# Patient Record
Sex: Female | Born: 1955
Health system: Southern US, Community
[De-identification: ages and names within clinical notes are randomized; demographics above are authoritative.]

## PROBLEM LIST (undated history)

## (undated) DIAGNOSIS — R200 Anesthesia of skin: Secondary | ICD-10-CM

## (undated) DIAGNOSIS — IMO0002 Reserved for concepts with insufficient information to code with codable children: Secondary | ICD-10-CM

## (undated) DIAGNOSIS — Z9889 Other specified postprocedural states: Secondary | ICD-10-CM

## (undated) DIAGNOSIS — M79609 Pain in unspecified limb: Secondary | ICD-10-CM

## (undated) DIAGNOSIS — J45909 Unspecified asthma, uncomplicated: Secondary | ICD-10-CM

## (undated) DIAGNOSIS — R112 Nausea with vomiting, unspecified: Secondary | ICD-10-CM

## (undated) DIAGNOSIS — R202 Paresthesia of skin: Secondary | ICD-10-CM

## (undated) DIAGNOSIS — Z9989 Dependence on other enabling machines and devices: Secondary | ICD-10-CM

## (undated) DIAGNOSIS — J189 Pneumonia, unspecified organism: Secondary | ICD-10-CM

## (undated) DIAGNOSIS — G4733 Obstructive sleep apnea (adult) (pediatric): Secondary | ICD-10-CM

## (undated) DIAGNOSIS — I10 Essential (primary) hypertension: Secondary | ICD-10-CM

## (undated) DIAGNOSIS — G43909 Migraine, unspecified, not intractable, without status migrainosus: Secondary | ICD-10-CM

## (undated) DIAGNOSIS — D649 Anemia, unspecified: Secondary | ICD-10-CM

## (undated) DIAGNOSIS — R531 Weakness: Principal | ICD-10-CM

## (undated) HISTORY — DX: Migraine, unspecified, not intractable, without status migrainosus: G43.909

## (undated) HISTORY — PX: KNEE ARTHROSCOPY: SHX127

## (undated) HISTORY — DX: Anesthesia of skin: R20.0

## (undated) HISTORY — DX: Pain in unspecified limb: M79.609

## (undated) HISTORY — DX: Weakness: R53.1

## (undated) HISTORY — PX: SHOULDER ARTHROSCOPY W/ SUPERIOR LABRAL ANTERIOR POSTERIOR REPAIR: SHX2403

## (undated) HISTORY — PX: RECTOCELE REPAIR: SHX761

## (undated) HISTORY — PX: BACK SURGERY: SHX140

## (undated) HISTORY — DX: Paresthesia of skin: R20.2

## (undated) HISTORY — PX: SHOULDER ARTHROSCOPY WITH SUBACROMIAL DECOMPRESSION: SHX5684

---

## 1979-08-12 HISTORY — PX: KNEE ARTHROSCOPY W/ MENISCAL REPAIR: SHX1877

## 1984-08-11 HISTORY — PX: VAGINAL HYSTERECTOMY: SUR661

## 2007-08-12 HISTORY — PX: KNEE ARTHROSCOPY W/ ACL RECONSTRUCTION: SHX1858

## 2010-08-11 HISTORY — PX: KNEE ARTHROSCOPY W/ MENISCAL REPAIR: SHX1877

## 2011-10-21 ENCOUNTER — Other Ambulatory Visit (HOSPITAL_COMMUNITY): Payer: Self-pay | Admitting: Chiropractic Medicine

## 2011-10-21 ENCOUNTER — Ambulatory Visit (HOSPITAL_COMMUNITY): Admission: RE | Admit: 2011-10-21 | Payer: 59 | Source: Other Acute Inpatient Hospital

## 2011-10-21 ENCOUNTER — Ambulatory Visit (HOSPITAL_COMMUNITY)
Admission: RE | Admit: 2011-10-21 | Discharge: 2011-10-21 | Disposition: A | Discharge status: Home / Self Care | Payer: 59 | Source: Ambulatory Visit | Attending: Chiropractic Medicine | Admitting: Chiropractic Medicine

## 2011-10-21 DIAGNOSIS — R200 Anesthesia of skin: Secondary | ICD-10-CM

## 2011-10-21 DIAGNOSIS — M5137 Other intervertebral disc degeneration, lumbosacral region: Secondary | ICD-10-CM | POA: Insufficient documentation

## 2011-10-21 DIAGNOSIS — R52 Pain, unspecified: Secondary | ICD-10-CM

## 2011-10-21 DIAGNOSIS — M51379 Other intervertebral disc degeneration, lumbosacral region without mention of lumbar back pain or lower extremity pain: Secondary | ICD-10-CM | POA: Insufficient documentation

## 2011-10-21 DIAGNOSIS — R209 Unspecified disturbances of skin sensation: Secondary | ICD-10-CM | POA: Insufficient documentation

## 2011-10-26 ENCOUNTER — Ambulatory Visit (HOSPITAL_COMMUNITY)
Admission: RE | Admit: 2011-10-26 | Discharge: 2011-10-26 | Disposition: A | Discharge status: Home / Self Care | Payer: 59 | Source: Other Acute Inpatient Hospital | Attending: Emergency Medicine | Admitting: Emergency Medicine

## 2011-10-27 LAB — CBC
MCH: 30.1 pg (ref 26.0–34.0)
MCHC: 32.9 g/dL (ref 30.0–36.0)
Platelets: 229 10*3/uL (ref 150–400)

## 2011-10-27 LAB — DIFFERENTIAL
Basophils Relative: 0 % (ref 0–1)
Eosinophils Absolute: 0.1 10*3/uL (ref 0.0–0.7)
Eosinophils Relative: 1 % (ref 0–5)
Neutrophils Relative %: 72 % (ref 43–77)

## 2011-10-27 LAB — RAPID HIV SCREEN (WH-MAU): Rapid HIV Screen: NEGATIVE — AB

## 2011-11-09 ENCOUNTER — Emergency Department (HOSPITAL_COMMUNITY)
Admission: EM | Admit: 2011-11-09 | Discharge: 2011-11-09 | Disposition: A | Discharge status: Home / Self Care | Payer: PRIVATE HEALTH INSURANCE | Attending: Emergency Medicine | Admitting: Emergency Medicine

## 2011-11-09 ENCOUNTER — Encounter (HOSPITAL_COMMUNITY): Payer: Self-pay | Admitting: *Deleted

## 2011-11-09 DIAGNOSIS — E860 Dehydration: Secondary | ICD-10-CM | POA: Insufficient documentation

## 2011-11-09 DIAGNOSIS — T887XXA Unspecified adverse effect of drug or medicament, initial encounter: Secondary | ICD-10-CM | POA: Insufficient documentation

## 2011-11-09 DIAGNOSIS — I10 Essential (primary) hypertension: Secondary | ICD-10-CM | POA: Insufficient documentation

## 2011-11-09 DIAGNOSIS — R1110 Vomiting, unspecified: Secondary | ICD-10-CM | POA: Insufficient documentation

## 2011-11-09 HISTORY — DX: Reserved for concepts with insufficient information to code with codable children: IMO0002

## 2011-11-09 LAB — COMPREHENSIVE METABOLIC PANEL
ALT: 18 U/L (ref 0–35)
Albumin: 4 g/dL (ref 3.5–5.2)
Calcium: 9.6 mg/dL (ref 8.4–10.5)
GFR calc Af Amer: >90 mL/min (ref >90)
Glucose, Bld: 85 mg/dL (ref 70–99)
Potassium: 3.4 mEq/L — ABNORMAL LOW (ref 3.5–5.1)
Sodium: 140 mEq/L (ref 135–145)
Total Protein: 7.3 g/dL (ref 6.0–8.3)

## 2011-11-09 MED ORDER — PROMETHAZINE HCL 25 MG/ML IJ SOLN
12.5000 mg | Freq: Once | INTRAMUSCULAR | Status: AC
  Administered 2011-11-09: 12:00:00 via INTRAVENOUS
  Filled 2011-11-09: qty 1

## 2011-11-09 MED ORDER — ONDANSETRON HCL 4 MG/2ML IJ SOLN
4.0000 mg | Freq: Once | INTRAMUSCULAR | Status: AC
  Administered 2011-11-09: 4 mg via INTRAVENOUS
  Filled 2011-11-09: qty 2

## 2011-11-09 MED ORDER — SODIUM CHLORIDE 0.9 % IV BOLUS (SEPSIS)
1000.0000 mL | Freq: Once | INTRAVENOUS | Status: AC
  Administered 2011-11-09: 1000 mL via INTRAVENOUS

## 2011-11-09 MED ORDER — METOCLOPRAMIDE HCL 5 MG/ML IJ SOLN
10.0000 mg | Freq: Once | INTRAMUSCULAR | Status: AC
  Administered 2011-11-09: 10 mg via INTRAVENOUS
  Filled 2011-11-09: qty 2

## 2011-11-09 NOTE — Discharge Instructions (Signed)
B.R.A.T. Diet Your doctor has recommended the B.R.A.T. diet for you or your child until the condition improves. This is often used to help control diarrhea and vomiting symptoms. If you or your child can tolerate clear liquids, you may have:  Bananas.   Rice.   Applesauce.   Toast (and other simple starches such as crackers, potatoes, noodles).  Be sure to avoid dairy products, meats, and fatty foods until symptoms are better. Fruit juices such as apple, grape, and prune juice can make diarrhea worse. Avoid these. Continue this diet for 2 days or as instructed by your caregiver. Document Released: 07/28/2005 Document Revised: 07/17/2011 Document Reviewed: 01/14/2007 Exit[employer] Patient Information 2012 [employer]. 

## 2011-11-09 NOTE — ED Provider Notes (Signed)
Medical screening examination/treatment/procedure(s) were conducted as a shared visit with non-physician practitioner(s) and myself.  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 She got halfway through the treatment course for 16 more days ago she been experiencing a lot of nausea vomiting and some mild abdominal discomfort for a while on that she started taking Zofran and Phenergan but today at work was feeling dehydrated rehydrated her she feels a better skin color is good could still clear her leave feeling somewhat not up to par will discharge home with work excuse.  Shelda Jakes, MD 11/09/11 1420

## 2011-11-09 NOTE — ED Notes (Signed)
Nausea and vomiting today.  Mild abd pain.

## 2011-11-28 NOTE — ED Provider Notes (Signed)
History     CSN: [id number]  Arrival date & time 11/09/11  1146   First MD Initiated Contact with Patient 11/09/11 1156      Chief Complaint  Patient presents with  . 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  She feels lightheaded when ambulating and very dehydrated.  Patient is a 56 y.o. female presenting with vomiting. The history is provided by the patient.  Emesis  The current episode started more than 1 week ago. The problem has been gradually worsening. There has been no fever. Pertinent negatives include no fever.    Past Medical History  Diagnosis Date  . Migraine   . DDD (degenerative disc disease)     Past Surgical History  Procedure Date  . Abdominal hysterectomy   . Rectocele repair   . Shoulder surgery     x 5  . Knee surgery     x 4    No family history on file.  History  Substance Use Topics  . Smoking status: Never Smoker   . Smokeless tobacco: Not on file  . Alcohol Use: No    OB History    Grav Para Term Preterm Abortions TAB SAB Ect Mult Living                  Review of Systems  Constitutional: Negative for fever.  HENT: Negative.  Negative for sore throat.   Respiratory: Negative.   Cardiovascular: Negative.   Gastrointestinal: Positive for nausea and vomiting.  Genitourinary: Negative.   Musculoskeletal: Negative.   Skin: Negative.   Neurological: Positive for weakness and light-headedness.  Hematological: Negative.     Allergies  Flexeril; Lyrica; Neurontin; Tape; Verapamil; and Versed  Home Medications   Current Outpatient Rx  Name Route Sig Dispense Refill  . AMITRIPTYLINE HCL 50 MG PO  TABS Oral Take 50 mg by mouth at bedtime as needed. migraines    . BUTALBITAL-APAP-CAFFEINE 50-300-40 MG PO CAPS Oral Take 1 capsule by mouth daily as needed. migraines    . DARUNAVIR ETHANOLATE 400 MG PO TABS Oral Take 400 mg by mouth at bedtime. 30 day regimen    . EMTRICITABINE-TENOFOVIR 200-300 MG PO TABS Oral Take 1 tablet by mouth daily.    Marland Kitchen ONDANSETRON HCL 4 MG PO TABS Oral Take 4 mg by mouth every 8 (eight) hours as needed. nausea    . PROMETHAZINE HCL 25 MG PO TABS Oral Take 25 mg by mouth every 6 (six) hours as needed. nausea    . RIBOFLAVIN-MAGNESIUM-FEVERFEW 200-180-50 MG PO TABS Oral Take 1 tablet by mouth at bedtime.    Marland Kitchen RITONAVIR 100 MG PO CAPS Oral Take 100 mg by mouth at bedtime.      BP 132/86  Pulse 96  Temp(Src) 98.1 F (36.7 C) (Oral)  Resp 18  Ht 5\' 11"  (1.803 m)  Wt 165 lb (74.844 kg)  BMI 23.01 kg/m2  SpO2 100%  Physical Exam  Nursing note and vitals reviewed. Constitutional: She appears well-developed and well-nourished.  HENT:  Head: Normocephalic and atraumatic.  Cardiovascular: Normal rate, regular rhythm and normal heart sounds.   Pulmonary/Chest: Effort normal and breath sounds normal.  Abdominal: Soft. She exhibits no distension. There is  no tenderness.  Musculoskeletal: Normal range of motion.  Neurological: She is alert.  Skin: Skin is warm and dry.    ED Course  Procedures (including critical care time)  Labs Reviewed  COMPREHENSIVE METABOLIC PANEL - Abnormal; Notable for the following:    Potassium 3.4 (*)    GFR calc non Af Amer 79 (*)    All other components within normal limits  LAB REPORT - SCANNED   No results found.   1. Emesis   2. Dehydration   3. Medication side effect     Patient given 3L NS per IV.  Phenergan 12.5 mg IV with no relief of nausea,  zofran with mild improvement.  Reglan given prior to dc home.  Zofran seemed to help nausea sx the best.  Improved lightheadedness at time of dc.  Family here to take her  home.  MDM  Side effects of anti HIV med prophylactic regimen.  CMET reviewed - LFT's normal at this time.  Pt to f/u with employee heath this week for recheck.        Candis Musa, PA 11/28/11 2322

## 2011-12-01 NOTE — ED Provider Notes (Signed)
Medical screening examination/treatment/procedure(s) were performed by non-physician practitioner and as supervising physician I was immediately available for consultation/collaboration.  Shelda Jakes, MD 12/01/11 1038

## 2012-02-02 ENCOUNTER — Ambulatory Visit (HOSPITAL_COMMUNITY)
Admission: RE | Admit: 2012-02-02 | Discharge: 2012-02-02 | Disposition: A | Discharge status: Home / Self Care | Payer: 59 | Source: Ambulatory Visit | Attending: Emergency Medicine | Admitting: Emergency Medicine

## 2012-02-02 ENCOUNTER — Encounter (HOSPITAL_COMMUNITY): Payer: Self-pay

## 2012-02-02 ENCOUNTER — Other Ambulatory Visit (HOSPITAL_COMMUNITY): Payer: Self-pay | Admitting: Emergency Medicine

## 2012-02-02 DIAGNOSIS — S0990XA Unspecified injury of head, initial encounter: Secondary | ICD-10-CM | POA: Insufficient documentation

## 2012-02-02 DIAGNOSIS — X58XXXA Exposure to other specified factors, initial encounter: Secondary | ICD-10-CM | POA: Insufficient documentation

## 2012-02-02 DIAGNOSIS — R51 Headache: Secondary | ICD-10-CM | POA: Insufficient documentation

## 2012-02-03 ENCOUNTER — Encounter (HOSPITAL_COMMUNITY): Payer: Self-pay | Admitting: *Deleted

## 2012-03-17 ENCOUNTER — Other Ambulatory Visit (HOSPITAL_COMMUNITY): Payer: Self-pay | Admitting: Orthopedic Surgery

## 2012-03-17 DIAGNOSIS — R52 Pain, unspecified: Secondary | ICD-10-CM

## 2012-03-19 ENCOUNTER — Ambulatory Visit (HOSPITAL_COMMUNITY)
Admission: RE | Admit: 2012-03-19 | Discharge: 2012-03-19 | Disposition: A | Discharge status: Home / Self Care | Payer: 59 | Source: Ambulatory Visit | Attending: Orthopedic Surgery | Admitting: Orthopedic Surgery

## 2012-03-19 DIAGNOSIS — R52 Pain, unspecified: Secondary | ICD-10-CM

## 2012-03-19 DIAGNOSIS — M25569 Pain in unspecified knee: Secondary | ICD-10-CM | POA: Insufficient documentation

## 2012-03-19 DIAGNOSIS — Z9889 Other specified postprocedural states: Secondary | ICD-10-CM | POA: Insufficient documentation

## 2012-03-19 DIAGNOSIS — M7120 Synovial cyst of popliteal space [Baker], unspecified knee: Secondary | ICD-10-CM | POA: Insufficient documentation

## 2012-04-06 ENCOUNTER — Emergency Department (HOSPITAL_COMMUNITY)
Admission: EM | Admit: 2012-04-06 | Discharge: 2012-04-06 | Disposition: A | Discharge status: Home / Self Care | Payer: 59 | Attending: Emergency Medicine | Admitting: Emergency Medicine

## 2012-04-06 ENCOUNTER — Encounter (HOSPITAL_COMMUNITY): Payer: Self-pay | Admitting: *Deleted

## 2012-04-06 ENCOUNTER — Ambulatory Visit (HOSPITAL_COMMUNITY)
Admit: 2012-04-06 | Discharge: 2012-04-06 | Disposition: A | Discharge status: Home / Self Care | Payer: 59 | Source: Ambulatory Visit | Attending: Emergency Medicine | Admitting: Emergency Medicine

## 2012-04-06 DIAGNOSIS — M79609 Pain in unspecified limb: Secondary | ICD-10-CM | POA: Insufficient documentation

## 2012-04-06 DIAGNOSIS — IMO0002 Reserved for concepts with insufficient information to code with codable children: Secondary | ICD-10-CM | POA: Insufficient documentation

## 2012-04-06 DIAGNOSIS — M7120 Synovial cyst of popliteal space [Baker], unspecified knee: Secondary | ICD-10-CM | POA: Insufficient documentation

## 2012-04-06 DIAGNOSIS — Z888 Allergy status to other drugs, medicaments and biological substances status: Secondary | ICD-10-CM | POA: Insufficient documentation

## 2012-04-06 DIAGNOSIS — M79605 Pain in left leg: Secondary | ICD-10-CM

## 2012-04-06 MED ORDER — ENOXAPARIN SODIUM 80 MG/0.8ML ~~LOC~~ SOLN
1.0000 mg/kg | Freq: Once | SUBCUTANEOUS | Status: AC
  Administered 2012-04-06: 75 mg via SUBCUTANEOUS
  Filled 2012-04-06: qty 0.8

## 2012-04-06 MED ORDER — HYDROCODONE-ACETAMINOPHEN 5-325 MG PO TABS: 1.0000 | ORAL_TABLET | ORAL | Status: DC | PRN

## 2012-04-06 MED ORDER — OXYCODONE-ACETAMINOPHEN 5-325 MG PO TABS
2.0000 | ORAL_TABLET | Freq: Once | ORAL | Status: AC
  Administered 2012-04-06: 2 via ORAL
  Filled 2012-04-06: qty 2

## 2012-04-06 NOTE — ED Notes (Signed)
Palpable pedal pulse left foot, cool to touch.  Pt states she is having pain in her left medial calf, cramping sensation.  States she has a bakers cyst that is leaking into the muscle of her left calf.

## 2012-04-06 NOTE — ED Provider Notes (Signed)
History     CSN: [id number]  Arrival date & time 04/06/12  0025   First MD Initiated Contact with Patient 04/06/12 0037      Chief Complaint  Patient presents with  . Leg Pain     The history is provided by the patient.   patient awoke this evening with severe left calf pain.  She reports the pain was severe.  She did not feel a knot in the back of her leg.  She is currently being followed by orthopedic surgeon for a leaking Baker cyst in her left popliteal space.  She denies weakness or numbness of her left lower extremity.  She's had no spreading erythema.  She denies swelling of her left leg.  She's had no chest pain or shortness of breath.  She has no other complaints.  Her pain is improving since awakening this morning  Past Medical History  Diagnosis Date  . Migraine   . DDD (degenerative disc disease)     Past Surgical History  Procedure Date  . Abdominal hysterectomy   . Rectocele repair   . Shoulder surgery     x 5  . Knee surgery     x 4    History reviewed. No pertinent family history.  History  Substance Use Topics  . Smoking status: Never Smoker   . Smokeless tobacco: Not on file  . Alcohol Use: No    OB History    Grav Para Term Preterm Abortions TAB SAB Ect Mult Living                  Review of Systems  All other systems reviewed and are negative.    Allergies  Flexeril; Midazolam hcl; Neurontin; Pregabalin; Tape; and Verapamil  Home Medications   Current Outpatient Rx  Name Route Sig Dispense Refill  . AMITRIPTYLINE HCL 50 MG PO TABS Oral Take 50 mg by mouth at bedtime as needed. migraines    . BUTALBITAL-APAP-CAFFEINE 50-300-40 MG PO CAPS Oral Take 1 capsule by mouth daily as needed. migraines    . DARUNAVIR ETHANOLATE 400 MG PO TABS Oral Take 400 mg by mouth at bedtime. 30 day regimen    . EMTRICITABINE-TENOFOVIR 200-300 MG PO TABS Oral Take 1 tablet by mouth daily.    Marland Kitchen HYDROCODONE-ACETAMINOPHEN 5-325 MG PO TABS Oral Take 1 tablet by  mouth every 4 (four) hours as needed for pain. 12 tablet 0  . ONDANSETRON HCL 4 MG PO TABS Oral Take 4 mg by mouth every 8 (eight) hours as needed. nausea    . PROMETHAZINE HCL 25 MG PO TABS Oral Take 25 mg by mouth every 6 (six) hours as needed. nausea    . RIBOFLAVIN-MAGNESIUM-FEVERFEW 200-180-50 MG PO TABS Oral Take 1 tablet by mouth at bedtime.    Marland Kitchen RITONAVIR 100 MG PO CAPS Oral Take 100 mg by mouth at bedtime.      BP 138/79  Pulse 94  Temp 97.5 F (36.4 C) (Oral)  Resp 18  Ht 5\' 11"  (1.803 m)  Wt 168 lb (76.204 kg)  BMI 23.43 kg/m2  SpO2 100%  Physical Exam  Nursing note and vitals reviewed. Constitutional: She is oriented to person, place, and time. She appears well-developed and well-nourished. No distress.  HENT:  Head: Normocephalic and atraumatic.  Eyes: EOM are normal.  Neck: Normal range of motion.  Pulmonary/Chest: Effort normal.  Musculoskeletal: Normal range of motion.       Normal pulses in her left foot.  Mild tenderness of left medial calf.  No erythema or swelling noted.  No unilateral leg swelling on the left.  Legs are symmetric bilaterally.  Neurological: She is alert and oriented to person, place, and time.  Skin: Skin is warm and dry.  Psychiatric: She has a normal mood and affect. Judgment normal.    ED Course  Procedures (including critical care time)  Labs Reviewed - No data to display No results found.   1. Left leg pain       MDM  Unclear etiology of left leg pain.  Lovenox now and DVT scan in the morning. Close PCP and orthopedic followup.  No signs of infection.  Normal pulses.  Compartments are soft.        Lyanne Co, MD 04/06/12 [phone]

## 2012-04-06 NOTE — ED Notes (Signed)
Pt reporting pain in lower left leg.  Denies injury, negative homans sign.

## 2012-04-09 ENCOUNTER — Encounter (HOSPITAL_COMMUNITY): Payer: Self-pay | Admitting: Pharmacy Technician

## 2012-04-19 ENCOUNTER — Encounter (HOSPITAL_COMMUNITY)
Admission: RE | Admit: 2012-04-19 | Discharge: 2012-04-19 | Disposition: A | Discharge status: Home / Self Care | Payer: 59 | Source: Ambulatory Visit | Attending: Orthopedic Surgery | Admitting: Orthopedic Surgery

## 2012-04-19 ENCOUNTER — Encounter (HOSPITAL_COMMUNITY): Payer: Self-pay

## 2012-04-19 HISTORY — DX: Anemia, unspecified: D64.9

## 2012-04-19 HISTORY — DX: Unspecified asthma, uncomplicated: J45.909

## 2012-04-19 HISTORY — DX: Pneumonia, unspecified organism: J18.9

## 2012-04-19 HISTORY — DX: Reserved for concepts with insufficient information to code with codable children: IMO0002

## 2012-04-19 HISTORY — DX: Nausea with vomiting, unspecified: R11.2

## 2012-04-19 HISTORY — DX: Other specified postprocedural states: Z98.890

## 2012-04-19 LAB — CBC
HCT: 41.7 % (ref 36.0–46.0)
Hemoglobin: 13.5 g/dL (ref 12.0–15.0)
MCV: 93.9 fL (ref 78.0–100.0)
RBC: 4.44 MIL/uL (ref 3.87–5.11)
RDW: 14.3 % (ref 11.5–15.5)
WBC: 5.4 10*3/uL (ref 4.0–10.5)

## 2012-04-19 LAB — SURGICAL PCR SCREEN: Staphylococcus aureus: NEGATIVE

## 2012-04-19 MED ORDER — CHLORHEXIDINE GLUCONATE 4 % EX LIQD: 60.0000 mL | Freq: Once | CUTANEOUS | Status: DC

## 2012-04-19 NOTE — Pre-Procedure Instructions (Signed)
22 Melissa Blackburn  04/19/2012   Your procedure is scheduled on:  Friday April 23, 2012  Report to [facility] at 9:30 AM.  Call this number if you have problems the morning of surgery: [phone]   Remember:   Do not eat food or drinkAfter Midnight.      Take these medicines the morning of surgery with A SIP OF WATER: Norco, Robaxin   Do not wear jewelry, make-up or nail polish.  Do not wear lotions, powders, or perfumes.   Do not shave 48 hours prior to surgery. Men may shave face and neck.  Do not bring valuables to the hospital.  Contacts, dentures or bridgework may not be worn into surgery.  Leave suitcase in the car. After surgery it may be brought to your room.  For patients admitted to the hospital, checkout time is 11:00 AM the day of discharge.   Patients discharged the day of surgery will not be allowed to drive home.  Name and phone number of your driver: Bess Saltzman, husband  Special Instructions: Incentive Spirometry - Practice and bring it with you on the day of surgery. and CHG Shower Use Special Wash: 1/2 bottle night before surgery and 1/2 bottle morning of surgery.   Please read over the following fact sheets that you were given: Pain Booklet, Coughing and Deep Breathing, MRSA Information and Surgical Site Infection Prevention

## 2012-04-19 NOTE — Progress Notes (Signed)
Patient undergoing testing due to needle stick, patient request surgeon be informed

## 2012-04-21 NOTE — H&P (Signed)
CC: left knee pain HPI: 56 y/o a left knee injury 3 months ago c/o continued moderate left knee medial knee pain and evidence of a ruptured baker's cyst on mri. Pt has elected for knee arthroscopy to aid with pain relief PMH: migraines, sleep apnea Allergies: flexeril, midazolam, neurontin, verapamil Meds: norco, robaxin, amitriptyline Social: married, non smoker, no etoh ROS: moderate pain to right medial knee otherwise negative ros PE: alert and appropriate 56 y/o female in no acute distress alert and oriented x 3 Bilateral upper extremities shows full rom no tenderness Left knee: trace effusion, moderate medial joint line tenderness nv intact distally Normal heel toe gait Right lower extremity: full rom no tenderness or deformity MRI: evidence of ruptured baker's cyst with normal appearing menisci Assessment: continued left knee pain s/p injury and injection Plan: arthroscopy of left knee to decrease pain and increase function

## 2012-04-22 MED ORDER — CEFAZOLIN SODIUM-DEXTROSE 2-3 GM-% IV SOLR
2.0000 g | INTRAVENOUS | Status: AC
  Administered 2012-04-23: 2 g via INTRAVENOUS
  Filled 2012-04-22: qty 50

## 2012-04-23 ENCOUNTER — Encounter (HOSPITAL_COMMUNITY): Payer: Self-pay | Admitting: Certified Registered"

## 2012-04-23 ENCOUNTER — Ambulatory Visit (HOSPITAL_COMMUNITY): Payer: 59 | Admitting: Certified Registered"

## 2012-04-23 ENCOUNTER — Ambulatory Visit (HOSPITAL_COMMUNITY)
Admission: RE | Admit: 2012-04-23 | Discharge: 2012-04-23 | Disposition: A | Discharge status: Home / Self Care | Payer: 59 | Source: Ambulatory Visit | Attending: Orthopedic Surgery | Admitting: Orthopedic Surgery

## 2012-04-23 ENCOUNTER — Encounter (HOSPITAL_COMMUNITY): Payer: Self-pay | Admitting: *Deleted

## 2012-04-23 ENCOUNTER — Encounter (HOSPITAL_COMMUNITY)
Admission: RE | Disposition: A | Discharge status: Home / Self Care | Payer: Self-pay | Source: Ambulatory Visit | Attending: Orthopedic Surgery

## 2012-04-23 DIAGNOSIS — X500XXA Overexertion from strenuous movement or load, initial encounter: Secondary | ICD-10-CM | POA: Insufficient documentation

## 2012-04-23 DIAGNOSIS — Z01812 Encounter for preprocedural laboratory examination: Secondary | ICD-10-CM | POA: Insufficient documentation

## 2012-04-23 DIAGNOSIS — G4730 Sleep apnea, unspecified: Secondary | ICD-10-CM | POA: Insufficient documentation

## 2012-04-23 DIAGNOSIS — M2340 Loose body in knee, unspecified knee: Secondary | ICD-10-CM | POA: Insufficient documentation

## 2012-04-23 DIAGNOSIS — S83289A Other tear of lateral meniscus, current injury, unspecified knee, initial encounter: Secondary | ICD-10-CM | POA: Insufficient documentation

## 2012-04-23 HISTORY — PX: KNEE ARTHROSCOPY: SHX127

## 2012-04-23 SURGERY — ARTHROSCOPY, KNEE
Anesthesia: General | Site: Knee | Laterality: Left | Wound class: Clean

## 2012-04-23 MED ORDER — BUPIVACAINE-EPINEPHRINE (PF) 0.5% -1:200000 IJ SOLN
INTRAMUSCULAR | Status: AC
  Filled 2012-04-23: qty 10

## 2012-04-23 MED ORDER — LIDOCAINE HCL (CARDIAC) 20 MG/ML IV SOLN
INTRAVENOUS | Status: DC | PRN
  Administered 2012-04-23: 100 mg via INTRAVENOUS

## 2012-04-23 MED ORDER — FENTANYL CITRATE 0.05 MG/ML IJ SOLN
INTRAMUSCULAR | Status: DC | PRN
  Administered 2012-04-23: 150 ug via INTRAVENOUS
  Administered 2012-04-23: 100 ug via INTRAVENOUS
  Administered 2012-04-23: 50 ug via INTRAVENOUS

## 2012-04-23 MED ORDER — KETOROLAC TROMETHAMINE 30 MG/ML IJ SOLN
30.0000 mg | INTRAMUSCULAR | Status: DC
  Filled 2012-04-23: qty 1

## 2012-04-23 MED ORDER — METHOCARBAMOL 500 MG PO TABS: 500.0000 mg | ORAL_TABLET | Freq: Three times a day (TID) | ORAL | Status: AC | PRN

## 2012-04-23 MED ORDER — LACTATED RINGERS IV SOLN
INTRAVENOUS | Status: DC | PRN
  Administered 2012-04-23 (×2): via INTRAVENOUS

## 2012-04-23 MED ORDER — PROMETHAZINE HCL 25 MG/ML IJ SOLN
INTRAMUSCULAR | Status: AC
  Filled 2012-04-23: qty 1

## 2012-04-23 MED ORDER — PROPOFOL 10 MG/ML IV BOLUS
INTRAVENOUS | Status: DC | PRN
  Administered 2012-04-23: 200 mg via INTRAVENOUS

## 2012-04-23 MED ORDER — ONDANSETRON HCL 4 MG/2ML IJ SOLN
INTRAMUSCULAR | Status: DC | PRN
  Administered 2012-04-23: 4 mg via INTRAVENOUS

## 2012-04-23 MED ORDER — MORPHINE SULFATE 10 MG/ML IJ SOLN
INTRAMUSCULAR | Status: DC | PRN
  Administered 2012-04-23 (×2): 5 mg via INTRAVENOUS

## 2012-04-23 MED ORDER — OXYCODONE-ACETAMINOPHEN 5-325 MG PO TABS: 1.0000 | ORAL_TABLET | ORAL | Status: AC | PRN

## 2012-04-23 MED ORDER — HYDROMORPHONE HCL PF 1 MG/ML IJ SOLN
0.2500 mg | INTRAMUSCULAR | Status: DC | PRN
  Administered 2012-04-23 (×2): 0.5 mg via INTRAVENOUS

## 2012-04-23 MED ORDER — HYDROMORPHONE HCL PF 1 MG/ML IJ SOLN
INTRAMUSCULAR | Status: AC
  Filled 2012-04-23: qty 1

## 2012-04-23 MED ORDER — PROMETHAZINE HCL 25 MG/ML IJ SOLN
6.2500 mg | INTRAMUSCULAR | Status: DC | PRN
  Administered 2012-04-23: 12.5 mg via INTRAVENOUS

## 2012-04-23 MED ORDER — BUPIVACAINE-EPINEPHRINE 0.5% -1:200000 IJ SOLN
INTRAMUSCULAR | Status: DC | PRN
  Administered 2012-04-23: 20 mL

## 2012-04-23 MED ORDER — KETOROLAC TROMETHAMINE 30 MG/ML IJ SOLN
INTRAMUSCULAR | Status: DC | PRN
  Administered 2012-04-23: 30 mg via INTRAVENOUS

## 2012-04-23 MED ORDER — DEXAMETHASONE SODIUM PHOSPHATE 4 MG/ML IJ SOLN
INTRAMUSCULAR | Status: DC | PRN
  Administered 2012-04-23: 8 mg via INTRAVENOUS

## 2012-04-23 SURGICAL SUPPLY — 44 items
BANDAGE ELASTIC 4 VELCRO ST LF (GAUZE/BANDAGES/DRESSINGS) IMPLANT
BANDAGE ELASTIC 6 VELCRO ST LF (GAUZE/BANDAGES/DRESSINGS) ×2 IMPLANT
BANDAGE GAUZE ELAST BULKY 4 IN (GAUZE/BANDAGES/DRESSINGS) ×4 IMPLANT
BLADE CUTTER GATOR 3.5 (BLADE) ×2 IMPLANT
CLOTH BEACON ORANGE TIMEOUT ST (SAFETY) ×2 IMPLANT
COVER SURGICAL LIGHT HANDLE (MISCELLANEOUS) IMPLANT
DRAPE ARTHROSCOPY W/POUCH 114 (DRAPES) ×2 IMPLANT
DRSG ADAPTIC 3X8 NADH LF (GAUZE/BANDAGES/DRESSINGS) ×2 IMPLANT
DRSG EMULSION OIL 3X3 NADH (GAUZE/BANDAGES/DRESSINGS) ×2 IMPLANT
DURAPREP 26ML APPLICATOR (WOUND CARE) ×2 IMPLANT
ELECT MENISCUS 165MM 90D (ELECTRODE) IMPLANT
ELECT REM PT RETURN 9FT ADLT (ELECTROSURGICAL)
ELECTRODE REM PT RTRN 9FT ADLT (ELECTROSURGICAL) IMPLANT
GAUZE SPONGE 4X4 16PLY XRAY LF (GAUZE/BANDAGES/DRESSINGS) ×2 IMPLANT
GLOVE BIO SURGEON STRL SZ7 (GLOVE) ×2 IMPLANT
GLOVE BIOGEL PI IND STRL 6.5 (GLOVE) ×2 IMPLANT
GLOVE BIOGEL PI INDICATOR 6.5 (GLOVE) ×2
GLOVE BIOGEL PI ORTHO PRO 7.5 (GLOVE) ×1
GLOVE BIOGEL PI ORTHO PRO SZ8 (GLOVE) ×1
GLOVE ORTHO TXT STRL SZ7.5 (GLOVE) ×2 IMPLANT
GLOVE PI ORTHO PRO STRL 7.5 (GLOVE) ×1 IMPLANT
GLOVE PI ORTHO PRO STRL SZ8 (GLOVE) ×1 IMPLANT
GLOVE SURG ORTHO 8.5 STRL (GLOVE) ×2 IMPLANT
GOWN STRL NON-REIN LRG LVL3 (GOWN DISPOSABLE) ×4 IMPLANT
GOWN STRL REIN XL XLG (GOWN DISPOSABLE) ×4 IMPLANT
KIT BASIN OR (CUSTOM PROCEDURE TRAY) ×2 IMPLANT
KIT ROOM TURNOVER OR (KITS) ×2 IMPLANT
MANIFOLD NEPTUNE II (INSTRUMENTS) ×2 IMPLANT
PACK ARTHROSCOPY DSU (CUSTOM PROCEDURE TRAY) ×2 IMPLANT
PAD ARMBOARD 7.5X6 YLW CONV (MISCELLANEOUS) ×4 IMPLANT
PAD CAST 4YDX4 CTTN HI CHSV (CAST SUPPLIES) ×1 IMPLANT
PADDING CAST COTTON 4X4 STRL (CAST SUPPLIES) ×1
PADDING CAST COTTON 6X4 STRL (CAST SUPPLIES) ×2 IMPLANT
PENCIL BUTTON HOLSTER BLD 10FT (ELECTRODE) IMPLANT
SET ARTHROSCOPY TUBING (MISCELLANEOUS) ×1
SET ARTHROSCOPY TUBING LN (MISCELLANEOUS) ×1 IMPLANT
SPONGE GAUZE 4X4 12PLY (GAUZE/BANDAGES/DRESSINGS) ×2 IMPLANT
STRIP CLOSURE SKIN 1/2X4 (GAUZE/BANDAGES/DRESSINGS) ×2 IMPLANT
SUT MNCRL AB 4-0 PS2 18 (SUTURE) ×2 IMPLANT
TOWEL OR 17X24 6PK STRL BLUE (TOWEL DISPOSABLE) ×4 IMPLANT
TUBE CONNECTING 12X1/4 (SUCTIONS) IMPLANT
WAND 90 DEG TURBOVAC W/CORD (SURGICAL WAND) ×2 IMPLANT
WATER STERILE IRR 1000ML POUR (IV SOLUTION) ×2 IMPLANT
WRAP KNEE MAXI GEL POST OP (GAUZE/BANDAGES/DRESSINGS) ×2 IMPLANT

## 2012-04-23 NOTE — Anesthesia Preprocedure Evaluation (Addendum)
Anesthesia Evaluation  Patient identified by MRN, date of birth, ID band Patient awake    Reviewed: Allergy & Precautions, H&P , NPO status , Patient's Chart, lab work & pertinent test results  History of Anesthesia Complications (+) PONV  Airway Mallampati: I TM Distance: >3 FB Neck ROM: Full    Dental  (+) Teeth Intact   Pulmonary asthma , sleep apnea and Continuous Positive Airway Pressure Ventilation ,  breath sounds clear to auscultation        Cardiovascular Rhythm:Regular     Neuro/Psych  Headaches,    GI/Hepatic   Endo/Other    Renal/GU      Musculoskeletal   Abdominal   Peds  Hematology   Anesthesia Other Findings   Reproductive/Obstetrics                          Anesthesia Physical Anesthesia Plan  ASA: II  Anesthesia Plan: General   Post-op Pain Management:    Induction: Intravenous  Airway Management Planned: LMA  Additional Equipment:   Intra-op Plan:   Post-operative Plan: Extubation in OR  Informed Consent: I have reviewed the patients History and Physical, chart, labs and discussed the procedure including the risks, benefits and alternatives for the proposed anesthesia with the patient or authorized representative who has indicated his/her understanding and acceptance.   Dental advisory given  Plan Discussed with: Surgeon and CRNA  Anesthesia Plan Comments:        Anesthesia Quick Evaluation

## 2012-04-23 NOTE — Anesthesia Postprocedure Evaluation (Signed)
  Anesthesia Post-op Note  Patient: Melissa Blackburn  Procedure(s) Performed: Procedure(s) (LRB) with comments: ARTHROSCOPY KNEE (Left) - lateral meniscectomy  Patient Location: PACU  Anesthesia Type: General  Level of Consciousness: awake, oriented and patient cooperative  Airway and Oxygen Therapy: Patient Spontanous Breathing  Post-op Pain: mild  Post-op Assessment: Post-op Vital signs reviewed and Patient's Cardiovascular Status Stable  Post-op Vital Signs: stable  Complications: No apparent anesthesia complications

## 2012-04-23 NOTE — Transfer of Care (Signed)
Immediate Anesthesia Transfer of Care Note  Patient: Melissa Blackburn  Procedure(s) Performed: Procedure(s) (LRB) with comments: ARTHROSCOPY KNEE (Left) - lateral meniscectomy  Patient Location: PACU  Anesthesia Type: General  Level of Consciousness: awake and alert   Airway & Oxygen Therapy: Patient Spontanous Breathing and Patient connected to face mask oxygen  Post-op Assessment: Report given to PACU RN and Post -op Vital signs reviewed and stable  Post vital signs: Reviewed and stable  Complications: No apparent anesthesia complications

## 2012-04-23 NOTE — Anesthesia Procedure Notes (Signed)
Procedure Name: LMA Insertion Date/Time: 04/23/2012 12:04 PM Performed by: Arlice Colt B Pre-anesthesia Checklist: Patient identified, Emergency Drugs available, Suction available, Patient being monitored and Timeout performed Patient Re-evaluated:Patient Re-evaluated prior to inductionOxygen Delivery Method: Circle system utilized Preoxygenation: Pre-oxygenation with 100% oxygen Intubation Type: IV induction LMA: LMA inserted LMA Size: 4.0 Number of attempts: 1 Placement Confirmation: positive ETCO2 and breath sounds checked- equal and bilateral Tube secured with: Tape Dental Injury: Teeth and Oropharynx as per pre-operative assessment

## 2012-04-23 NOTE — Brief Op Note (Signed)
04/23/2012  1:06 PM  PATIENT:  Melissa Blackburn  56 y.o. female  PRE-OPERATIVE DIAGNOSIS:  left knee meniscal tear  POST-OPERATIVE DIAGNOSIS:  left knee meniscal tear, lateral , ACL intact  PROCEDURE:  Procedure(s) (LRB) with comments: ARTHROSCOPY KNEE (Left) - lateral meniscectomy  SURGEON:  Surgeon(s) and Role:    * Verlee Rossetti, MD - Primary  PHYSICIAN ASSISTANT:   ASSISTANTS: none   ANESTHESIA:   general  EBL:  Total I/O In: 1000 [I.V.:1000] Out: -   BLOOD ADMINISTERED:none  DRAINS: none   LOCAL MEDICATIONS USED:  MARCAINE     SPECIMEN:  No Specimen  DISPOSITION OF SPECIMEN:  N/A  COUNTS:  YES  TOURNIQUET:  * No tourniquets in log *  DICTATION: .Other Dictation: Dictation Number [id number]  PLAN OF CARE: Discharge to home  PATIENT DISPOSITION:  PACU - hemodynamically stable.   Delay start of Pharmacological VTE agent (>24hrs) due to surgical blood loss or risk of bleeding: not applicable

## 2012-04-23 NOTE — Discharge Instructions (Signed)
WBAT with crutches for balance Ice the knee at all times, Do exercises every hour, quad sets, heel slides, ans calf pumps  Keep incisions clean and dry for 5 days then ok to get them wet in the shower  Follow up in two weeks  Take one baby aspirin a day to prevent blood clots and wear compression type socks

## 2012-04-23 NOTE — Interval H&P Note (Signed)
History and Physical Interval Note:  04/23/2012 11:39 AM  Melissa Blackburn  has presented today for surgery, with the diagnosis of left knee meniscal tear  The various methods of treatment have been discussed with the patient and family. After consideration of risks, benefits and other options for treatment, the patient has consented to  Procedure(s) (LRB) with comments: ARTHROSCOPY KNEE (Left) as a surgical intervention .  The patient's history has been reviewed, patient examined, no change in status, stable for surgery.  I have reviewed the patient's chart and labs.  Questions were answered to the patient's satisfaction.     Macari Zalesky,STEVEN R

## 2012-04-24 NOTE — Op Note (Signed)
NAME:  Melissa Blackburn, Melissa Blackburn NO.:  [id number]  MEDICAL RECORD NO.:  [id number]  LOCATION:  [facility]                         FACILITY:  [facility]  PHYSICIAN:  Almedia Balls. Ranell Patrick, M.D. DATE OF BIRTH:  October 07, 1955  DATE OF PROCEDURE:  04/23/2012 DATE OF DISCHARGE:  04/23/2012                              OPERATIVE REPORT   PREOPERATIVE DIAGNOSIS:  Left knee meniscal tear.  POSTOPERATIVE DIAGNOSES: 1. Left knee lateral meniscus tear at the meniscal root 2. Anterior cruciate ligament intact.  PROCEDURE PERFORMED:  Left knee arthroscopy with partial lateral meniscectomy and limited chondroplasty throughout the knee.  ATTENDING SURGEON:  Almedia Balls. Ranell Patrick, MD.  ANESTHESIA:  LMA general anesthesia was used.  INSTRUMENT COUNTS:  Correct.  COMPLICATIONS:  No complications.  ANTIBIOTICS:  Perioperative antibiotics were given.  ESTIMATED BLOOD LOSS:  Minimal.  FLUID REPLACEMENT:  1000 mL crystalloid.  INDICATIONS:  The patient is a 56 year old female with worsening left knee pain after a twisting injury to her knee.  The patient had a prior ACL reconstruction.  Preoperative MRI scan suspicious for medial meniscus tear and given ongoing pain in her knee, the patient elected to proceed with arthroscopic knee surgery to eliminate pain and restore function to her knee.  Informed consent was obtained.  DESCRIPTION OF PROCEDURE:  After adequate level of anesthesia achieved, the patient was positioned in the supine position.  Left leg correctly identified and placed in arthroscopic leg holder.  Right leg placed in a well leg holder.  Sterile prep and drape of left knee.  Time-out was called.  We entered the knee in standard portals including superolateral outflow, anterolateral scope, and anteromedial working portals.  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  We performed a tangential chondroplasty up on the  patella.  Medial and lateral gutters were inspected.  No loose bodies noted. Medial meniscus was probed and visualized on all surfaces from the meniscal root posteriorly to anteriorly.  No tear was identified.  There was some subtle chondromalacia on the femoral condyle and some soft cartilage there.  We just did tangential chondroplasty, just trying to remove loose flaps and fibrillation.  No deep layer nor deep debridement was done.  ACL was intact.  There were some frayed fibers which we removed using suction shaver and used the ArthroCare to seal over some of the collagen just superficially to try to prevent any more fraying.  Otherwise a small bony loose body that was sort of well encapsulated just anterior to the ACL.  I wanted to make sure that was not impinging, so we removed that with a pituitary rongeur and shaver.  Lateral compartment was entered. There was a posterior horn lateral meniscus tear at the meniscal root. We performed a partial meniscectomy removing very little of that just maybe 20-30% of the meniscal root and beveled into the meniscus tear. The meniscus was quite stable.  I probed it throughout and no other tear was identified in the lateral side.  The lateral articular cartilage looked a lot better than the medial side.  Following complete inspection of the knee including putting the scope through the notch  and the posterior aspect of the knee, we lavaged the knee and concluded surgery suturing the wounds with 4-0 Monocryl followed by Steri-Strips and sterile compressive bandage.  The patient tolerated the surgery well.     Almedia Balls. Ranell Patrick, M.D.     SRN/MEDQ  D:  04/23/2012  T:  04/24/2012  Job:  [id number]

## 2012-04-26 ENCOUNTER — Encounter (HOSPITAL_COMMUNITY): Payer: Self-pay | Admitting: Orthopedic Surgery

## 2012-10-02 ENCOUNTER — Emergency Department (HOSPITAL_COMMUNITY): Payer: 59

## 2012-10-02 ENCOUNTER — Encounter (HOSPITAL_COMMUNITY): Payer: Self-pay | Admitting: Emergency Medicine

## 2012-10-02 ENCOUNTER — Emergency Department (HOSPITAL_COMMUNITY)
Admission: EM | Admit: 2012-10-02 | Discharge: 2012-10-02 | Disposition: A | Discharge status: Home / Self Care | Payer: 59 | Attending: Emergency Medicine | Admitting: Emergency Medicine

## 2012-10-02 ENCOUNTER — Other Ambulatory Visit: Payer: Self-pay

## 2012-10-02 DIAGNOSIS — Z79899 Other long term (current) drug therapy: Secondary | ICD-10-CM | POA: Insufficient documentation

## 2012-10-02 DIAGNOSIS — G4730 Sleep apnea, unspecified: Secondary | ICD-10-CM | POA: Insufficient documentation

## 2012-10-02 DIAGNOSIS — Z9981 Dependence on supplemental oxygen: Secondary | ICD-10-CM | POA: Insufficient documentation

## 2012-10-02 DIAGNOSIS — G43909 Migraine, unspecified, not intractable, without status migrainosus: Secondary | ICD-10-CM | POA: Insufficient documentation

## 2012-10-02 DIAGNOSIS — E876 Hypokalemia: Secondary | ICD-10-CM

## 2012-10-02 DIAGNOSIS — Z862 Personal history of diseases of the blood and blood-forming organs and certain disorders involving the immune mechanism: Secondary | ICD-10-CM | POA: Insufficient documentation

## 2012-10-02 DIAGNOSIS — R079 Chest pain, unspecified: Secondary | ICD-10-CM

## 2012-10-02 DIAGNOSIS — M62838 Other muscle spasm: Secondary | ICD-10-CM | POA: Insufficient documentation

## 2012-10-02 DIAGNOSIS — Z8739 Personal history of other diseases of the musculoskeletal system and connective tissue: Secondary | ICD-10-CM | POA: Insufficient documentation

## 2012-10-02 DIAGNOSIS — R0602 Shortness of breath: Secondary | ICD-10-CM | POA: Insufficient documentation

## 2012-10-02 DIAGNOSIS — R748 Abnormal levels of other serum enzymes: Secondary | ICD-10-CM

## 2012-10-02 DIAGNOSIS — J45909 Unspecified asthma, uncomplicated: Secondary | ICD-10-CM | POA: Insufficient documentation

## 2012-10-02 DIAGNOSIS — R252 Cramp and spasm: Secondary | ICD-10-CM

## 2012-10-02 DIAGNOSIS — R61 Generalized hyperhidrosis: Secondary | ICD-10-CM | POA: Insufficient documentation

## 2012-10-02 DIAGNOSIS — M2550 Pain in unspecified joint: Secondary | ICD-10-CM | POA: Insufficient documentation

## 2012-10-02 DIAGNOSIS — R110 Nausea: Secondary | ICD-10-CM | POA: Insufficient documentation

## 2012-10-02 DIAGNOSIS — Z8701 Personal history of pneumonia (recurrent): Secondary | ICD-10-CM | POA: Insufficient documentation

## 2012-10-02 LAB — CBC WITH DIFFERENTIAL/PLATELET
Basophils Absolute: 0 10*3/uL (ref 0.0–0.1)
Lymphocytes Relative: 36 % (ref 12–46)
Lymphs Abs: 1.7 10*3/uL (ref 0.7–4.0)
Neutrophils Relative %: 50 % (ref 43–77)
Platelets: 265 10*3/uL (ref 150–400)
RBC: 4.64 MIL/uL (ref 3.87–5.11)
RDW: 13.8 % (ref 11.5–15.5)
WBC: 4.7 10*3/uL (ref 4.0–10.5)

## 2012-10-02 LAB — COMPREHENSIVE METABOLIC PANEL
ALT: 80 U/L — ABNORMAL HIGH (ref 0–35)
AST: 68 U/L — ABNORMAL HIGH (ref 0–37)
Alkaline Phosphatase: 136 U/L — ABNORMAL HIGH (ref 39–117)
CO2: 25 mEq/L (ref 19–32)
Calcium: 10.1 mg/dL (ref 8.4–10.5)
GFR calc Af Amer: >90 mL/min (ref >90)
Glucose, Bld: 116 mg/dL — ABNORMAL HIGH (ref 70–99)
Potassium: 3.1 mEq/L — ABNORMAL LOW (ref 3.5–5.1)
Sodium: 141 mEq/L (ref 135–145)
Total Protein: 7.5 g/dL (ref 6.0–8.3)

## 2012-10-02 LAB — TROPONIN I: Troponin I: <0.3 ng/mL (ref <0.30)

## 2012-10-02 MED ORDER — POTASSIUM CHLORIDE CRYS ER 20 MEQ PO TBCR
40.0000 meq | EXTENDED_RELEASE_TABLET | Freq: Once | ORAL | Status: AC
  Administered 2012-10-02: 40 meq via ORAL
  Filled 2012-10-02: qty 2

## 2012-10-02 MED ORDER — POTASSIUM CHLORIDE ER 10 MEQ PO TBCR: 10.0000 meq | EXTENDED_RELEASE_TABLET | Freq: Two times a day (BID) | ORAL | Status: DC

## 2012-10-02 MED ORDER — GI COCKTAIL ~~LOC~~
30.0000 mL | Freq: Once | ORAL | Status: AC
  Administered 2012-10-02: 30 mL via ORAL
  Filled 2012-10-02: qty 30

## 2012-10-02 MED ORDER — PROMETHAZINE HCL 25 MG/ML IJ SOLN
12.5000 mg | Freq: Once | INTRAMUSCULAR | Status: AC
  Administered 2012-10-02: 12.5 mg via INTRAVENOUS
  Filled 2012-10-02: qty 1

## 2012-10-02 MED ORDER — CARISOPRODOL 350 MG PO TABS: 350.0000 mg | ORAL_TABLET | Freq: Four times a day (QID) | ORAL | Status: DC | PRN

## 2012-10-02 MED ORDER — KETOROLAC TROMETHAMINE 30 MG/ML IJ SOLN
INTRAMUSCULAR | Status: AC
  Administered 2012-10-02: 30 mg
  Filled 2012-10-02: qty 1

## 2012-10-02 MED ORDER — DIAZEPAM 5 MG/ML IJ SOLN
5.0000 mg | Freq: Once | INTRAMUSCULAR | Status: AC
  Administered 2012-10-02: 5 mg via INTRAVENOUS
  Filled 2012-10-02: qty 2

## 2012-10-02 NOTE — ED Provider Notes (Signed)
History    This chart was scribed for Melissa Lennert, MD by Melissa Blackburn, ED Scribe. The patient was seen in room APA11/APA11 and the patient's care was started at 6:44PM.    CSN: [id number]  Arrival date & time 10/02/12  1836   First MD Initiated Contact with Patient 10/02/12 1842      Chief Complaint  Patient presents with  . Chest Pain    (Consider location/radiation/quality/duration/timing/severity/associated sxs/prior treatment) Patient is a 57 y.o. female presenting with chest pain. The history is provided by the patient. No language interpreter was used.  Chest Pain Pain location:  Substernal area Pain quality: pressure   Pain radiates to:  L arm and L jaw Pain radiates to the back: no   Pain severity:  Moderate Onset quality:  Gradual Duration:  3 days Timing:  Constant Progression:  Worsening Chronicity:  Chronic Context: not breathing   Relieved by:  Nothing Worsened by:  Nothing tried Ineffective treatments:  Aspirin Associated symptoms: diaphoresis, nausea and shortness of breath   Associated symptoms: no fever    Melissa Blackburn is a 57 y.o. female who presents to the Emergency Department complaining of waxing and waning, moderate to severe, central chest pain (mostly pressure) that radiates to her left arm with an onset months ago that has been getting progressively worse the past 3 days with associated shortness of breath, nausea, and diaphoresis. Before the original onset months ago, she has never had these type of symptoms before. ASA at home did not alleviate the pain. Deep breathing does not aggravate the chest pain. She reports that NTG will give her a headache and does not want any today. She denies pain medications taken at home and does not want pain meds today. She also complains of muscle spasms "to the top of her legs and feet"  She denies any family history of heart abnormalities; she had a stress test "a long time ago". She is currently taking  ciprofloxacin for UTI. Last physical was 3-4 years ago. No other pertinent medical symptoms.  Past Medical History  Diagnosis Date  . Migraine   . DDD (degenerative disc disease)   . Complication of anesthesia   . PONV (postoperative nausea and vomiting)     "severe nausea and vomiting"  . Asthma     as a child  . Pneumonia     hx of  . Sleep apnea     cpap 7  . Anemia     hx of  . Degenerative disc disease   . Needle stick injury with contaminated needle     patient undergoing testing    Past Surgical History  Procedure Laterality Date  . Abdominal hysterectomy    . Rectocele repair    . Shoulder surgery      x 5  . Knee surgery      x 4  . Knee arthroscopy  04/23/2012    Procedure: ARTHROSCOPY KNEE;  Surgeon: Melissa Rossetti, MD;  Location: [facility] OR;  Service: Orthopedics;  Laterality: Left;  lateral meniscectomy    No family history on file.  History  Substance Use Topics  . Smoking status: Never Smoker   . Smokeless tobacco: Not on file  . Alcohol Use: No    OB History   Grav Para Term Preterm Abortions TAB SAB Ect Mult Living                  Review of Systems  Constitutional: Positive for diaphoresis.  Negative for fever.  Respiratory: Positive for shortness of breath.   Cardiovascular: Positive for chest pain.  Gastrointestinal: Positive for nausea.  Musculoskeletal: Positive for arthralgias.  All other systems reviewed and are negative.   Allergies  Verapamil; Avelox; Flexeril; Midazolam hcl; Neurontin; Pregabalin; and Tape  Home Medications   Current Outpatient Rx  Name  Route  Sig  Dispense  Refill  . amitriptyline (ELAVIL) 50 MG tablet   Oral   Take 50 mg by mouth at bedtime as needed. For migraines.         Marland Kitchen aspirin-acetaminophen-caffeine (EXCEDRIN MIGRAINE) 250-250-65 MG per tablet   Oral   Take 3 tablets by mouth 2 (two) times daily as needed for pain.         . calcium carbonate (TUMS EX) 750 MG chewable tablet   Oral   Chew  1-2 tablets by mouth daily as needed.          . ciprofloxacin (CIPRO) 500 MG tablet   Oral   Take 500 mg by mouth 2 (two) times daily. For UTI         . methocarbamol (ROBAXIN) 500 MG tablet   Oral   Take 500 mg by mouth every 6 (six) hours as needed. For muscle spasms.           BP 153/83  Pulse 87  Temp(Src) 97.9 F (36.6 C) (Oral)  Resp 13  Ht 5\' 11"  (1.803 m)  Wt 170 lb (77.111 kg)  BMI 23.72 kg/m2  SpO2 98%  Physical Exam  Nursing note and vitals reviewed. Constitutional: She is oriented to person, place, and time. She appears well-developed.  HENT:  Head: Normocephalic and atraumatic.  Eyes: Conjunctivae and EOM are normal. No scleral icterus.  Neck: Neck supple. No thyromegaly present.  Cardiovascular: Normal rate and regular rhythm.  Exam reveals no gallop and no friction rub.   No murmur heard. Pulmonary/Chest: No stridor. She has no wheezes. She has no rales. She exhibits no tenderness.  Abdominal: She exhibits no distension. There is no tenderness. There is no rebound.  Musculoskeletal: Normal range of motion. She exhibits no edema.  Lymphadenopathy:    She has no cervical adenopathy.  Neurological: She is oriented to person, place, and time. Coordination normal.  Skin: No rash noted. No erythema.  Psychiatric: She has a normal mood and affect. Her behavior is normal.    ED Course  Procedures (including critical care time)  DIAGNOSTIC STUDIES: Oxygen Saturation is 100% on room air, normal by my interpretation.    COORDINATION OF CARE:  6:54PM - GI cocktail, phenergan, CXR, CBC with differential, CMP, and troponin I will be ordered for Ky Barban.   7:30PM - imaging results reviewed and are unremarkable.  7:45PM - lab results reviewed Labs Reviewed  COMPREHENSIVE METABOLIC PANEL - Abnormal; Notable for the following:    Potassium 3.1 (*)    Glucose, Bld 116 (*)    AST 68 (*)    ALT 80 (*)    Alkaline Phosphatase 136 (*)    Total  Bilirubin 0.2 (*)    All other components within normal limits  CBC WITH DIFFERENTIAL  TROPONIN I  D-DIMER, QUANTITATIVE   Dg Chest 2 View  10/02/2012  *RADIOLOGY REPORT*  Clinical Data: Chest pain, diaphoresis, nausea and shortness of breath.  CHEST - 2 VIEW  Comparison: No priors.  Findings: Lung volumes are normal.  No consolidative airspace disease.  No pleural effusions.  No pneumothorax.  No pulmonary nodule  or mass noted.  Pulmonary vasculature and the cardiomediastinal silhouette are within normal limits.  IMPRESSION: 1. No radiographic evidence of acute cardiopulmonary disease.   Original Report Authenticated By: Trudie Reed, M.D.      No diagnosis found.    MDM    The chart was scribed for me under my direct supervision.  I personally performed the history, physical, and medical decision making and all procedures in the evaluation of this patient.Melissa Lennert, MD 10/02/12 2218

## 2012-10-02 NOTE — ED Notes (Signed)
Patient with c/o centralized chest pain that radiate to jaw and left arm that started at 1700 today. +diaphoresis, nausea, shortness of breath.

## 2012-10-06 ENCOUNTER — Ambulatory Visit (HOSPITAL_COMMUNITY)
Admission: RE | Admit: 2012-10-06 | Discharge: 2012-10-06 | Disposition: A | Discharge status: Home / Self Care | Payer: 59 | Source: Ambulatory Visit | Attending: Neurology | Admitting: Neurology

## 2012-10-06 ENCOUNTER — Encounter (HOSPITAL_COMMUNITY): Payer: Self-pay

## 2012-10-06 ENCOUNTER — Other Ambulatory Visit: Payer: Self-pay | Admitting: Neurology

## 2012-10-06 ENCOUNTER — Emergency Department (HOSPITAL_COMMUNITY): Payer: 59

## 2012-10-06 ENCOUNTER — Emergency Department (HOSPITAL_COMMUNITY)
Admission: EM | Admit: 2012-10-06 | Discharge: 2012-10-06 | Disposition: A | Discharge status: Home / Self Care | Payer: 59 | Attending: Emergency Medicine | Admitting: Emergency Medicine

## 2012-10-06 DIAGNOSIS — R279 Unspecified lack of coordination: Secondary | ICD-10-CM | POA: Insufficient documentation

## 2012-10-06 DIAGNOSIS — J45909 Unspecified asthma, uncomplicated: Secondary | ICD-10-CM | POA: Insufficient documentation

## 2012-10-06 DIAGNOSIS — M4802 Spinal stenosis, cervical region: Secondary | ICD-10-CM | POA: Insufficient documentation

## 2012-10-06 DIAGNOSIS — Z8739 Personal history of other diseases of the musculoskeletal system and connective tissue: Secondary | ICD-10-CM | POA: Insufficient documentation

## 2012-10-06 DIAGNOSIS — R32 Unspecified urinary incontinence: Secondary | ICD-10-CM | POA: Insufficient documentation

## 2012-10-06 DIAGNOSIS — M62838 Other muscle spasm: Secondary | ICD-10-CM | POA: Insufficient documentation

## 2012-10-06 DIAGNOSIS — R209 Unspecified disturbances of skin sensation: Secondary | ICD-10-CM | POA: Insufficient documentation

## 2012-10-06 DIAGNOSIS — R270 Ataxia, unspecified: Secondary | ICD-10-CM

## 2012-10-06 DIAGNOSIS — Z79899 Other long term (current) drug therapy: Secondary | ICD-10-CM | POA: Insufficient documentation

## 2012-10-06 DIAGNOSIS — Z8701 Personal history of pneumonia (recurrent): Secondary | ICD-10-CM | POA: Insufficient documentation

## 2012-10-06 DIAGNOSIS — Z862 Personal history of diseases of the blood and blood-forming organs and certain disorders involving the immune mechanism: Secondary | ICD-10-CM | POA: Insufficient documentation

## 2012-10-06 DIAGNOSIS — M5030 Other cervical disc degeneration, unspecified cervical region: Secondary | ICD-10-CM | POA: Insufficient documentation

## 2012-10-06 DIAGNOSIS — Z9181 History of falling: Secondary | ICD-10-CM | POA: Insufficient documentation

## 2012-10-06 DIAGNOSIS — Z8679 Personal history of other diseases of the circulatory system: Secondary | ICD-10-CM | POA: Insufficient documentation

## 2012-10-06 LAB — URINALYSIS, ROUTINE W REFLEX MICROSCOPIC
Leukocytes, UA: NEGATIVE
Nitrite: NEGATIVE
Specific Gravity, Urine: <1.005 — ABNORMAL LOW (ref 1.005–1.030)
pH: 6 (ref 5.0–8.0)

## 2012-10-06 LAB — CBC WITH DIFFERENTIAL/PLATELET
Basophils Absolute: 0 10*3/uL (ref 0.0–0.1)
Basophils Relative: 0 % (ref 0–1)
Hemoglobin: 13.4 g/dL (ref 12.0–15.0)
MCHC: 32.8 g/dL (ref 30.0–36.0)
Monocytes Relative: 9 % (ref 3–12)
Neutro Abs: 3.1 10*3/uL (ref 1.7–7.7)
Neutrophils Relative %: 62 % (ref 43–77)
RDW: 13.7 % (ref 11.5–15.5)

## 2012-10-06 LAB — BASIC METABOLIC PANEL
Chloride: 106 mEq/L (ref 96–112)
GFR calc Af Amer: >90 mL/min (ref >90)
Potassium: 4 mEq/L (ref 3.5–5.1)
Sodium: 141 mEq/L (ref 135–145)

## 2012-10-06 MED ORDER — GADOBENATE DIMEGLUMINE 529 MG/ML IV SOLN
15.0000 mL | Freq: Once | INTRAVENOUS | Status: AC | PRN
  Administered 2012-10-06: 15 mL via INTRAVENOUS

## 2012-10-06 MED ORDER — DIAZEPAM 5 MG PO TABS: 5.0000 mg | ORAL_TABLET | Freq: Once | ORAL | Status: AC

## 2012-10-06 MED ORDER — DIAZEPAM 5 MG PO TABS: 5.0000 mg | ORAL_TABLET | Freq: Two times a day (BID) | ORAL | Status: DC

## 2012-10-06 MED ORDER — DIAZEPAM 5 MG PO TABS
ORAL_TABLET | ORAL | Status: AC
  Administered 2012-10-06: 5 mg via ORAL
  Filled 2012-10-06: qty 1

## 2012-10-06 NOTE — ED Notes (Signed)
RN at bedside

## 2012-10-06 NOTE — ED Notes (Signed)
Complain of numbness and tingling down both legs and feet. Pt states she has also lost bladder control this morning.

## 2012-10-06 NOTE — ED Notes (Signed)
Bladder scanned 373 cc in bladder

## 2012-10-06 NOTE — ED Notes (Signed)
Patient is resting comfortably. 

## 2012-10-06 NOTE — ED Provider Notes (Signed)
History    This chart was scribed for [name], MD by Charolett Bumpers, ED Scribe. The patient was seen in room APA10/APA10. Patient's care was started at 0834.   CSN: [id number]  Arrival date & time 10/06/12  0821   First MD Initiated Contact with Patient 10/06/12 [phone]      Chief Complaint  Patient presents with  . Numbness     The history is provided by the patient. No language interpreter was used.  Melissa Blackburn is a 57 y.o. female who presents to the Emergency Department complaining of constant, bilateral numbness in her legs and feet that started this morning around 3:15 am. The numbness started in her feet and moved up into her legs. She states that she was trying to work out her muscle spasms in her legs by stretching when the numbness started. She has a h/o muscle spasms in her lower legs for the past several months. She reports burning in her lower back but no pain currently. She states that she fell to the floor due to the muscle spasms. She also reports x2 episodes of urinary incontinence. She states both episodes occurred after having the urge to urinate but could not make it to the restroom and produced only small amounts of urine. She has a h/o a fall several years ago in which she tore muscles in her back and has had intermittent back pain in the past. She also reports a h/o right sciatica frequently but today's pain feels different. She denies any h/o numbness with her muscles spasms. She denies any visual disturbances, numbness in upper extremities, difficulty with speech or swallowing.   Past Medical History  Diagnosis Date  . Migraine   . DDD (degenerative disc disease)   . Complication of anesthesia   . PONV (postoperative nausea and vomiting)     "severe nausea and vomiting"  . Asthma     as a child  . Pneumonia     hx of  . Sleep apnea     cpap 7  . Anemia     hx of  . Degenerative disc disease   . Needle stick injury with contaminated needle    patient undergoing testing    Past Surgical History  Procedure Laterality Date  . Abdominal hysterectomy    . Rectocele repair    . Shoulder surgery      x 5  . Knee surgery      x 4  . Knee arthroscopy  04/23/2012    Procedure: ARTHROSCOPY KNEE;  Surgeon: Verlee Rossetti, MD;  Location: [facility] OR;  Service: Orthopedics;  Laterality: Left;  lateral meniscectomy    No family history on file.  History  Substance Use Topics  . Smoking status: Never Smoker   . Smokeless tobacco: Not on file  . Alcohol Use: No    OB History   Grav Para Term Preterm Abortions TAB SAB Ect Mult Living                  Review of Systems A complete 10 system review of systems was obtained and all systems are negative except as noted in the HPI and PMH.   Allergies  Verapamil; Avelox; Flexeril; Midazolam hcl; Neurontin; Pregabalin; and Tape  Home Medications   Current Outpatient Rx  Name  Route  Sig  Dispense  Refill  . albuterol (PROVENTIL HFA;VENTOLIN HFA) 108 (90 BASE) MCG/ACT inhaler   Inhalation   Inhale 2 puffs into the  lungs every 6 (six) hours as needed for wheezing.         Marland Kitchen amitriptyline (ELAVIL) 50 MG tablet   Oral   Take 50 mg by mouth at bedtime as needed. For migraines.         Marland Kitchen aspirin-acetaminophen-caffeine (EXCEDRIN MIGRAINE) 250-250-65 MG per tablet   Oral   Take 3 tablets by mouth 2 (two) times daily as needed for pain.         . calcium carbonate (TUMS EX) 750 MG chewable tablet   Oral   Chew 1-2 tablets by mouth daily as needed.          . ciprofloxacin (CIPRO) 500 MG tablet   Oral   Take 500 mg by mouth 2 (two) times daily. For UTI         . methocarbamol (ROBAXIN) 500 MG tablet   Oral   Take 500 mg by mouth every 6 (six) hours as needed. For muscle spasms.         . potassium chloride (K-DUR) 10 MEQ tablet   Oral   Take 1 tablet (10 mEq total) by mouth 2 (two) times daily.   10 tablet   0   . carisoprodol (SOMA) 350 MG tablet   Oral   Take 1  tablet (350 mg total) by mouth 4 (four) times daily as needed for muscle spasms.   40 tablet   1   . diazepam (VALIUM) 5 MG tablet   Oral   Take 1 tablet (5 mg total) by mouth 2 (two) times daily.   10 tablet   0     BP 136/72  Pulse 102  Temp(Src) 97.4 F (36.3 C) (Oral)  Resp 18  Ht 5\' 11"  (1.803 m)  Wt 170 lb (77.111 kg)  BMI 23.72 kg/m2  SpO2 98%  Physical Exam  Nursing note and vitals reviewed. Constitutional: She is oriented to person, place, and time. She appears well-developed and well-nourished. No distress.  HENT:  Head: Normocephalic and atraumatic.  Right Ear: External ear normal.  Left Ear: External ear normal.  Nose: Nose normal.  Mouth/Throat: Oropharynx is clear and moist.  Eyes: Conjunctivae and EOM are normal. Pupils are equal, round, and reactive to light.  Neck: Normal range of motion. Neck supple. No tracheal deviation present.  Cardiovascular: Regular rhythm and normal heart sounds.  Tachycardia present.   Pulmonary/Chest: Effort normal and breath sounds normal. No respiratory distress. She has no wheezes.  Abdominal: Soft. Bowel sounds are normal. She exhibits no distension. There is no tenderness.  Musculoskeletal: Normal range of motion. She exhibits no edema.  Neurological: She is alert and oriented to person, place, and time.  Reflexes preserved at the knees. Strength 5/5 throughout all extremities. Decreased sensation to light touch on the left below the knee. Decreased sensation to sharp touch on the right foot. Normal neurological exam.  Normal heel shin, normal finger-nose-finger, normal strength in all 4 extremities including flexion and extension at the hips knees and ankles.  Skin: Skin is warm and dry.  Psychiatric: She has a normal mood and affect. Her behavior is normal.    ED Course  Procedures (including critical care time)  DIAGNOSTIC STUDIES: Oxygen Saturation is 98% on room air, normal by my interpretation.    COORDINATION OF  CARE:  08:45-Discussed planned course of treatment with the patient including CBC with diff, BMP, UA, a bladder scan and a MRI of her lower back, who is agreeable at this time.  09:15-Medication Orders: Diazepam (Valium) tablet 5 mg-once.   10:20-Recheck: Informed pt imaging and lab results. She reports improvement of her muscle cramps with Valium given in ED. She reports the numbness has unchanged. Will consult Dr. Gerilyn Pilgrim to set up an appointment and d/c home. She is agreeable with plan.   Results for orders placed during the hospital encounter of 10/06/12  CBC WITH DIFFERENTIAL      Result Value Range   WBC 5.0  4.0 - 10.5 K/uL   RBC 4.38  3.87 - 5.11 MIL/uL   Hemoglobin 13.4  12.0 - 15.0 g/dL   HCT 95.6  21.3 - 08.6 %   MCV 93.4  78.0 - 100.0 fL   MCH 30.6  26.0 - 34.0 pg   MCHC 32.8  30.0 - 36.0 g/dL   RDW 57.8  46.9 - 62.9 %   Platelets 239  150 - 400 K/uL   Neutrophils Relative 62  43 - 77 %   Neutro Abs 3.1  1.7 - 7.7 K/uL   Lymphocytes Relative 26  12 - 46 %   Lymphs Abs 1.3  0.7 - 4.0 K/uL   Monocytes Relative 9  3 - 12 %   Monocytes Absolute 0.5  0.1 - 1.0 K/uL   Eosinophils Relative 3  0 - 5 %   Eosinophils Absolute 0.1  0.0 - 0.7 K/uL   Basophils Relative 0  0 - 1 %   Basophils Absolute 0.0  0.0 - 0.1 K/uL  BASIC METABOLIC PANEL      Result Value Range   Sodium 141  135 - 145 mEq/L   Potassium 4.0  3.5 - 5.1 mEq/L   Chloride 106  96 - 112 mEq/L   CO2 28  19 - 32 mEq/L   Glucose, Bld 96  70 - 99 mg/dL   BUN 13  6 - 23 mg/dL   Creatinine, Ser 5.28  0.50 - 1.10 mg/dL   Calcium 9.9  8.4 - 41.3 mg/dL   GFR calc non Af Amer >90  >90 mL/min   GFR calc Af Amer >90  >90 mL/min  URINALYSIS, ROUTINE W REFLEX MICROSCOPIC      Result Value Range   Color, Urine YELLOW  YELLOW   APPearance CLEAR  CLEAR   Specific Gravity, Urine <1.005 (*) 1.005 - 1.030   pH 6.0  5.0 - 8.0   Glucose, UA NEGATIVE  NEGATIVE mg/dL   Hgb urine dipstick NEGATIVE  NEGATIVE   Bilirubin Urine  NEGATIVE  NEGATIVE   Ketones, ur NEGATIVE  NEGATIVE mg/dL   Protein, ur NEGATIVE  NEGATIVE mg/dL   Urobilinogen, UA 0.2  0.0 - 1.0 mg/dL   Nitrite NEGATIVE  NEGATIVE   Leukocytes, UA NEGATIVE  NEGATIVE    Mr Lumbar Spine Wo Contrast  10/06/2012  *RADIOLOGY REPORT*  Clinical Data: Weakness, numbness and urinary incontinence.  Low back pain.  MRI LUMBAR SPINE WITHOUT CONTRAST  Technique:  Multiplanar and multiecho pulse sequences of the lumbar spine were obtained without intravenous contrast.  Comparison: Radiography 10/21/2011  Findings: There is no significant finding at L3-4 or above.  The discs are normal in signal characteristics and morphology.  The spinal canal and foramina are widely patent.  The distal cord and conus are normal.  L4-5:  Mild bulging of the disc.  Mild facet and ligamentous hypertrophy.  No compressive narrowing of the canal or foramina.  L5-S1:  Chronic disc degeneration with desiccation and loss of height.  Mild circumferential bulging  of the disc with small osteophytes, slightly prominent in the left foraminal to extraforaminal region.  The central canal is widely patent.  IMPRESSION: No cause of urinary incontinence identified.  L4-5:  Mild bulging of the disc.  Mild facet degeneration and ligamentous hypertrophy.  The findings could be associated with back pain, but there is no apparent neural compression.  L5 S1:  Chronic disc degeneration.  Mild encroachment in the left foraminal to extraforaminal region by osteophyte and bulging disc, but no gross neural compression.   Original Report Authenticated By: Paulina Fusi, M.D.      1. Lower extremity numbness   2. Urinary incontinence   3. Muscle spasm       MDM  Pt has had numbness which is bialteral LE and asymetrical in location as well as her urinary inctontinence this AM which was not present on arrival - she was able to use the commode without difficulty on arrival.  She has normal MS, MRI has been reviewed, no  signs of L spine compression, will need referral to Neurologist, would also consider MS or less likely GBS as she has normal strengtha nd preserved reflexes.  D/w Dr. Gerilyn Pilgrim who will see immediately upon discharge.  No other recommendations given at this time, valium has helped some with her muscle spasms, appears stable for d/c.    I personally performed the services described in this documentation, which was scribed in my presence. The recorded information has been reviewed and is accurate.        [name], MD 10/06/12 [phone]

## 2012-10-11 ENCOUNTER — Other Ambulatory Visit (HOSPITAL_COMMUNITY): Payer: Self-pay

## 2012-10-19 ENCOUNTER — Emergency Department (HOSPITAL_COMMUNITY)
Admission: EM | Admit: 2012-10-19 | Discharge: 2012-10-19 | Disposition: A | Discharge status: Home / Self Care | Payer: 59 | Attending: Emergency Medicine | Admitting: Emergency Medicine

## 2012-10-19 ENCOUNTER — Encounter (HOSPITAL_COMMUNITY): Payer: Self-pay | Admitting: Emergency Medicine

## 2012-10-19 DIAGNOSIS — J45909 Unspecified asthma, uncomplicated: Secondary | ICD-10-CM | POA: Insufficient documentation

## 2012-10-19 DIAGNOSIS — M62838 Other muscle spasm: Secondary | ICD-10-CM | POA: Insufficient documentation

## 2012-10-19 DIAGNOSIS — G8929 Other chronic pain: Secondary | ICD-10-CM | POA: Insufficient documentation

## 2012-10-19 DIAGNOSIS — R0789 Other chest pain: Secondary | ICD-10-CM | POA: Insufficient documentation

## 2012-10-19 DIAGNOSIS — Z792 Long term (current) use of antibiotics: Secondary | ICD-10-CM | POA: Insufficient documentation

## 2012-10-19 DIAGNOSIS — Z862 Personal history of diseases of the blood and blood-forming organs and certain disorders involving the immune mechanism: Secondary | ICD-10-CM | POA: Insufficient documentation

## 2012-10-19 DIAGNOSIS — R110 Nausea: Secondary | ICD-10-CM | POA: Insufficient documentation

## 2012-10-19 DIAGNOSIS — Z87828 Personal history of other (healed) physical injury and trauma: Secondary | ICD-10-CM | POA: Insufficient documentation

## 2012-10-19 DIAGNOSIS — G4730 Sleep apnea, unspecified: Secondary | ICD-10-CM | POA: Insufficient documentation

## 2012-10-19 DIAGNOSIS — Z8739 Personal history of other diseases of the musculoskeletal system and connective tissue: Secondary | ICD-10-CM | POA: Insufficient documentation

## 2012-10-19 DIAGNOSIS — R42 Dizziness and giddiness: Secondary | ICD-10-CM | POA: Insufficient documentation

## 2012-10-19 DIAGNOSIS — R5381 Other malaise: Secondary | ICD-10-CM | POA: Insufficient documentation

## 2012-10-19 DIAGNOSIS — R0602 Shortness of breath: Secondary | ICD-10-CM | POA: Insufficient documentation

## 2012-10-19 DIAGNOSIS — G43909 Migraine, unspecified, not intractable, without status migrainosus: Secondary | ICD-10-CM | POA: Insufficient documentation

## 2012-10-19 DIAGNOSIS — Z8701 Personal history of pneumonia (recurrent): Secondary | ICD-10-CM | POA: Insufficient documentation

## 2012-10-19 DIAGNOSIS — M6281 Muscle weakness (generalized): Secondary | ICD-10-CM | POA: Insufficient documentation

## 2012-10-19 DIAGNOSIS — R209 Unspecified disturbances of skin sensation: Secondary | ICD-10-CM | POA: Insufficient documentation

## 2012-10-19 DIAGNOSIS — Z79899 Other long term (current) drug therapy: Secondary | ICD-10-CM | POA: Insufficient documentation

## 2012-10-19 DIAGNOSIS — R059 Cough, unspecified: Secondary | ICD-10-CM | POA: Insufficient documentation

## 2012-10-19 DIAGNOSIS — M549 Dorsalgia, unspecified: Secondary | ICD-10-CM | POA: Insufficient documentation

## 2012-10-19 LAB — CBC WITH DIFFERENTIAL/PLATELET
Basophils Absolute: 0 10*3/uL (ref 0.0–0.1)
HCT: 44.3 % (ref 36.0–46.0)
Hemoglobin: 14.7 g/dL (ref 12.0–15.0)
Lymphocytes Relative: 30 % (ref 12–46)
Monocytes Absolute: 0.6 10*3/uL (ref 0.1–1.0)
Monocytes Relative: 10 % (ref 3–12)
Neutro Abs: 3.2 10*3/uL (ref 1.7–7.7)
Neutrophils Relative %: 54 % (ref 43–77)
RDW: 13.6 % (ref 11.5–15.5)
WBC: 5.9 10*3/uL (ref 4.0–10.5)

## 2012-10-19 LAB — BASIC METABOLIC PANEL
Chloride: 103 mEq/L (ref 96–112)
GFR calc Af Amer: 85 mL/min — ABNORMAL LOW (ref >90)
GFR calc non Af Amer: 73 mL/min — ABNORMAL LOW (ref >90)
Potassium: 3.7 mEq/L (ref 3.5–5.1)

## 2012-10-19 MED ORDER — ONDANSETRON HCL 4 MG/2ML IJ SOLN
4.0000 mg | Freq: Once | INTRAMUSCULAR | Status: AC
  Administered 2012-10-19: 4 mg via INTRAVENOUS
  Filled 2012-10-19: qty 2

## 2012-10-19 MED ORDER — METHOCARBAMOL 500 MG PO TABS
500.0000 mg | ORAL_TABLET | Freq: Once | ORAL | Status: AC
  Administered 2012-10-19: 500 mg via ORAL
  Filled 2012-10-19: qty 1

## 2012-10-19 NOTE — ED Provider Notes (Signed)
History  This chart was scribed for Joya Gaskins, MD by Bennett Scrape, ED Scribe. This patient was seen in room APA16A/APA16A and the patient's care was started at 10:56 AM.  CSN: [id number]  Arrival date & time 10/19/12  1045   First MD Initiated Contact with Patient 10/19/12 1056      Chief Complaint  Patient presents with  . Dizziness  . Weakness  . Nausea    Patient is a 57 y.o. female presenting with extremity weakness. The history is provided by the patient. No language interpreter was used.  Extremity Weakness This is a recurrent problem. The current episode started more than 1 week ago. The problem has not changed since onset.Associated symptoms include shortness of breath. Pertinent negatives include no chest pain and no abdominal pain. The symptoms are aggravated by walking. Nothing relieves the symptoms.    Melissa Blackburn is a 57 y.o. female who presents to the Emergency Department complaining of 2 weeks of intermittent episodes of weakness in bilateral legs described as stumbling with associated numbness described as decreased sensation of the tongue, dizziness as feeling like falling to the right, nausea and SOB. Pt also reports 2 days of cough that started last night. She states that she has been seen here after having numbness in her legs with associated muscle spasms on 10/06/12 and had MRIs performed. She was told to follow up with [facility] and states that she was seen at [facility] last week for the same. She was told that MS could not be ruled out but stated that her symptoms are atypical of MS. Pt reports some recent tick bites but denies any recent out of country travels. She denies having a h/o CVAs or heart disease. She reports chronic back pain but denies any CP, fevers, and rash as associated symptoms. She denies smoking and alcohol use.  Past Medical History  Diagnosis Date  . Migraine   . DDD (degenerative disc disease)   . Complication of anesthesia    . PONV (postoperative nausea and vomiting)     "severe nausea and vomiting"  . Asthma     as a child  . Pneumonia     hx of  . Sleep apnea     cpap 7  . Anemia     hx of  . Degenerative disc disease   . Needle stick injury with contaminated needle     patient undergoing testing    Past Surgical History  Procedure Laterality Date  . Abdominal hysterectomy    . Rectocele repair    . Shoulder surgery      x 5  . Knee surgery      x 4  . Knee arthroscopy  04/23/2012    Procedure: ARTHROSCOPY KNEE;  Surgeon: Verlee Rossetti, MD;  Location: [facility] OR;  Service: Orthopedics;  Laterality: Left;  lateral meniscectomy    Family History  Problem Relation Age of Onset  . Cancer Mother   . Hypertension Father   . COPD Father   . Coronary artery disease Father     History  Substance Use Topics  . Smoking status: Never Smoker   . Smokeless tobacco: Never Used  . Alcohol Use: No    OB History   Grav Para Term Preterm Abortions TAB SAB Ect Mult Living   3 2 2  1  1   2       Review of Systems  Constitutional: Negative for fever and chills.  Respiratory: Positive for  cough, chest tightness and shortness of breath.   Cardiovascular: Negative for chest pain.  Gastrointestinal: Positive for nausea. Negative for vomiting, abdominal pain and diarrhea.  Musculoskeletal: Positive for extremity weakness.  Skin: Negative for rash.  Neurological: Positive for dizziness and weakness.  Psychiatric/Behavioral: Negative for agitation. The patient is not nervous/anxious.   All other systems reviewed and are negative.    Allergies  Verapamil; Avelox; Flexeril; Midazolam hcl; Neurontin; Pregabalin; and Tape  Home Medications   Current Outpatient Rx  Name  Route  Sig  Dispense  Refill  . albuterol (PROVENTIL HFA;VENTOLIN HFA) 108 (90 BASE) MCG/ACT inhaler   Inhalation   Inhale 2 puffs into the lungs every 6 (six) hours as needed for wheezing.         Marland Kitchen amitriptyline (ELAVIL) 50 MG  tablet   Oral   Take 50 mg by mouth at bedtime as needed. For migraines.         Marland Kitchen aspirin-acetaminophen-caffeine (EXCEDRIN MIGRAINE) 250-250-65 MG per tablet   Oral   Take 3 tablets by mouth 2 (two) times daily as needed for pain.         . calcium carbonate (TUMS EX) 750 MG chewable tablet   Oral   Chew 1-2 tablets by mouth daily as needed.          . carisoprodol (SOMA) 350 MG tablet   Oral   Take 1 tablet (350 mg total) by mouth 4 (four) times daily as needed for muscle spasms.   40 tablet   1   . ciprofloxacin (CIPRO) 500 MG tablet   Oral   Take 500 mg by mouth 2 (two) times daily. For UTI         . diazepam (VALIUM) 5 MG tablet   Oral   Take 1 tablet (5 mg total) by mouth 2 (two) times daily.   10 tablet   0   . methocarbamol (ROBAXIN) 500 MG tablet   Oral   Take 500 mg by mouth every 6 (six) hours as needed. For muscle spasms.         . potassium chloride (K-DUR) 10 MEQ tablet   Oral   Take 1 tablet (10 mEq total) by mouth 2 (two) times daily.   10 tablet   0     Triage Vitals: BP 148/93  Pulse 91  Temp(Src) 97.4 F (36.3 C) (Oral)  Resp 16  Ht 5\' 11"  (1.803 m)  Wt 175 lb (79.379 kg)  BMI 24.42 kg/m2  SpO2 99% BP 111/92  Pulse 89  Temp(Src) 97.4 F (36.3 C) (Oral)  Resp 12  Ht 5\' 11"  (1.803 m)  Wt 175 lb (79.379 kg)  BMI 24.42 kg/m2  SpO2 98%  Physical Exam  Nursing note and vitals reviewed.   CONSTITUTIONAL: Well developed/well nourished HEAD: Normocephalic/atraumatic EYES: EOMI/PERRL, no nystagmus ENMT: Mucous membranes moist NECK: supple no meningeal signs SPINE:entire spine nontender CV: S1/S2 noted, no murmurs/rubs/gallops noted LUNGS: Lungs are clear to auscultation bilaterally, no apparent distress ABDOMEN: soft, nontender, no rebound or guarding GU:no cva tenderness NEURO: Pt is awake/alert, moves all extremitiesx4, facies symmetric, no arm or leg drift is noted No past pointing ?mild hyperreflexia to left patella,  but otherwise patellar/achilles reflex symmetric No clonus noted on either LE EXTREMITIES: pulses normal, full ROM SKIN: warm, color normal, no rash PSYCH: no abnormalities of mood noted  ED Course  Procedures  DIAGNOSTIC STUDIES: Oxygen Saturation is 99% on room air, normal by my interpretation.  COORDINATION OF CARE: 11:10 AM-Discussed treatment plan which includes antiemetic with pt at bedside and pt agreed to plan.   11:!5 AM- Ordered 5 mg Zofran injection  Pt without any focal neuro deficits on my exam.  She can ambulate. She is tolerating PO These symptoms have been present for weeks with intermittent course She has MR imaging recently I spoke to dr Frances Furbish with [facility] who has seen this patient She feels guillian barre is less likely given history/exam She did have +ANA that was found on labs today at neuro office She requests patient to arrive today at neuro office to have full autoimmune panel sent She does not feel LP or CSF studies need to be done currently She has no signs of acute CVA and she can ambulate I doubt ACS as cause of dizziness Pt agreeable with plan  MDM  Nursing notes including past medical history and social history reviewed and considered in documentation Labs/vital reviewed and considered Previous records reviewed and considered - recent MR imaging results reviewed       Date: 10/19/2012  Rate: 100  Rhythm: sinus tachycardia  QRS Axis: normal  Intervals: normal  ST/T Wave abnormalities: nonspecific ST changes  Conduction Disutrbances:none  Narrative Interpretation:   Old EKG Reviewed: unchanged from 10/02/2012    I personally performed the services described in this documentation, which was scribed in my presence. The recorded information has been reviewed and is accurate.      Joya Gaskins, MD 10/19/12 [phone]

## 2012-10-19 NOTE — ED Notes (Signed)
Patient c/o dizziness, weakness, and nausea. Per patient weakness "for a couple of weeks"-seeing neurologist for possible MS. Per patient started feeling dizzy yesterday and hearing ringing in ears. Patient reports visual changes and shortness of breath.

## 2012-10-19 NOTE — ED Notes (Signed)
To bring patient meal tray.

## 2012-10-26 ENCOUNTER — Ambulatory Visit: Payer: Self-pay | Admitting: Gastroenterology

## 2012-11-02 ENCOUNTER — Telehealth: Payer: Self-pay | Admitting: Neurology

## 2012-11-02 NOTE — Telephone Encounter (Signed)
I have called her, she complains of progressive weakness, she was not able to work because of bilateral legs, and arm weakness.  No dysarthria, no breathing difficulty. Dr. Frances Furbish will call her tomorrow for further management plan.  Lab reviewed, showed normal CMP, mild glucose 106, RA, RPR, B12 537. Positive ANA. normal CRP, TSH, A1C.Melissa Blackburn

## 2012-11-03 ENCOUNTER — Other Ambulatory Visit: Payer: Self-pay | Admitting: Neurology

## 2012-11-03 ENCOUNTER — Telehealth: Payer: Self-pay | Admitting: Neurology

## 2012-11-03 DIAGNOSIS — R29898 Other symptoms and signs involving the musculoskeletal system: Secondary | ICD-10-CM

## 2012-11-03 DIAGNOSIS — M79609 Pain in unspecified limb: Secondary | ICD-10-CM

## 2012-11-03 NOTE — Telephone Encounter (Signed)
I called the patient on her cell phone to talk to her about her complaint of worsening muscle weakness. The patient states that she is trying to work but can only work about 6 hours. She complains of diffuse muscle weakness in her arms and legs. Even day-to-day chores such as doing laundry he is getting difficult. She describes a aching and some soreness in her muscles as well as burning and electric shocklike sensations in her nerves. Her lower extremity pain is worse and sometimes feels like sciatica. We added some rheumatological labs recently on 10/19/2012 after her ANA had come back positive. Her lupus anticoagulant and Sjogren's antibodies as well as anti-double-strand DNA antibodies were all negative. In addition, previously she had a normal ESR, normal rheumatoid factor, normal RPR, normal TSH, normal hemoglobin A1c, normal B12 level, normal CRP. She has not yet established care with a primary care physician and I encouraged her to do so. She stated that she has an upcoming appointment next week. I told her that I do not have a good explanation for her diffuse weakness especially in the absence of any systemic laboratory abnormalities. Nevertheless, we can proceed with EMG nerve conduction studies at this moment since her problem has been progressive and ongoing for over a month. She was in agreement. I will set her up for EMG and nerve conduction studies.

## 2012-11-05 ENCOUNTER — Other Ambulatory Visit: Payer: Self-pay | Admitting: Neurology

## 2012-11-05 DIAGNOSIS — M79609 Pain in unspecified limb: Secondary | ICD-10-CM

## 2012-11-05 DIAGNOSIS — R29898 Other symptoms and signs involving the musculoskeletal system: Secondary | ICD-10-CM

## 2012-11-15 ENCOUNTER — Encounter: Payer: Self-pay | Admitting: Neurology

## 2012-11-15 ENCOUNTER — Ambulatory Visit (INDEPENDENT_AMBULATORY_CARE_PROVIDER_SITE_OTHER): Payer: 59 | Admitting: Neurology

## 2012-11-15 ENCOUNTER — Other Ambulatory Visit: Payer: Self-pay

## 2012-11-15 VITALS — BP 134/80 | HR 95 | Temp 98.4°F | Ht 71.5 in | Wt 181.0 lb

## 2012-11-15 DIAGNOSIS — M79609 Pain in unspecified limb: Secondary | ICD-10-CM

## 2012-11-15 DIAGNOSIS — R202 Paresthesia of skin: Secondary | ICD-10-CM

## 2012-11-15 DIAGNOSIS — R531 Weakness: Secondary | ICD-10-CM

## 2012-11-15 DIAGNOSIS — R209 Unspecified disturbances of skin sensation: Secondary | ICD-10-CM

## 2012-11-15 DIAGNOSIS — R5381 Other malaise: Secondary | ICD-10-CM

## 2012-11-15 DIAGNOSIS — R200 Anesthesia of skin: Secondary | ICD-10-CM

## 2012-11-15 HISTORY — DX: Paresthesia of skin: R20.2

## 2012-11-15 HISTORY — DX: Anesthesia of skin: R20.0

## 2012-11-15 HISTORY — DX: Weakness: R53.1

## 2012-11-15 HISTORY — DX: Pain in unspecified limb: M79.609

## 2012-11-15 NOTE — Progress Notes (Signed)
Subjective:    Patient ID: Melissa Blackburn is a 57 y.o. female.  HPI  Interim history:  Melissa Blackburn is a 57 year old right-handed woman with an underlying diagnosis of migraine headaches, degenerative disc disease, osteoarthritis and obstructive sleep apnea who presents for followup consultation of her numbness, paresthesias, weakness and one episode of urinary incontinence. She is accompanied by her husband again today. I first met her and her husband on 10/13/2012 at the request of the ER physician. She had been to the ER twice recently in February for the symptoms. She recently had an MRI of the lumbar spine showing mild disc bulging and she had seen a local neurologist in [place], [place] in February and she had an MRI and MRA of the brain as well as of the neck which had shown no lesions concerning for him as. She was supposed to get nerve conduction and EMG testing at his office but never went back. When I had seen her she had a fairly nonfocal exam and I suggested further blood work. She had a CMP which showed a elevated alkaline phosphatase of 133 and a ALT of 59, cholesterol was mildly elevated at 219 and LDL was elevated at 132, TSH, rheumatoid factor, RPR, ESR, vitamin B12, CRP were all normal. She had a positive ANA and I was going to call her to get more rheumatological blood work done. However, that today I got a call from in the ER physician at [facility], where the patient also works. She had presented there with numbness and tingling again. I talked to the ER physician about my intention to do more blood work and he related that to the patient and she came back for more blood testing. She had a negative lupus anticoagulant, negative Sjogren's antibodies, and negative anti-double strand DNA antibodies. She had called Dr. Terrace Arabia on call on 11/02/12 for worsening of her symptoms and I called her back on 11/03/12 In the morning and she said that she was having more weakness in her arms  and legs and was unable to work for more than 6 hours and even chores like doing the laundry worse becoming difficult. I ordered an EMG and nerve conduction test and reminded her of keeping her upcoming appointment with me for today. She has not been called for the appointment yet. She feels her weakness is proximal and distal and worse in the legs than UEs and there is pain that feels like an electric shock sensation. She feels numb from the knees down and lateral arms and both hands. Some paresthesias in her feet and legs are reported. She is most bothered by her weakness as it is affecting her work and her chores at home. With standing, she has pain in the back and her hamstrings.  She has recently established care with Dr. Shelva Majestic as her new PCP.  Her Past Medical History Is Significant For: Past Medical History  Diagnosis Date  . Migraine   . DDD (degenerative disc disease)   . Complication of anesthesia   . PONV (postoperative nausea and vomiting)     "severe nausea and vomiting"  . Asthma     as a child  . Pneumonia     hx of  . Sleep apnea     cpap 7  . Anemia     hx of  . Degenerative disc disease   . Needle stick injury with contaminated needle     patient undergoing testing  . Weakness generalized  11/15/2012  . Paresthesias 11/15/2012  . Pain in limb 11/15/2012  . Numbness and tingling 11/15/2012    Her Past Surgical History Is Significant For: Past Surgical History  Procedure Laterality Date  . Abdominal hysterectomy    . Rectocele repair    . Shoulder surgery      x 5  . Knee surgery      x 4  . Knee arthroscopy  04/23/2012    Procedure: ARTHROSCOPY KNEE;  Surgeon: Verlee Rossetti, MD;  Location: [facility] OR;  Service: Orthopedics;  Laterality: Left;  lateral meniscectomy    Her Family History Is Significant For: Family History  Problem Relation Age of Onset  . Cancer Mother   . Hypertension Father   . COPD Father   . Coronary artery disease Father     Her Social  History Is Significant For: History   Social History  . Marital Status: Married    Spouse Name: N/A    Number of Children: N/A  . Years of Education: N/A   Social History Main Topics  . Smoking status: Never Smoker   . Smokeless tobacco: Never Used  . Alcohol Use: No  . Drug Use: No  . Sexually Active: None   Other Topics Concern  . None   Social History Narrative   ** Merged History Encounter **        Her Allergies Are:  Allergies  Allergen Reactions  . Verapamil Anaphylaxis  . Avelox (Moxifloxacin Hcl In Nacl) Diarrhea  . Flexeril (Cyclobenzaprine Hcl) Other (See Comments)    Systolic Levels "bottoms out"  . Midazolam Hcl Nausea And Vomiting  . Neurontin (Gabapentin) Other (See Comments)    Muscle spasms  . Pregabalin Other (See Comments)    Muscle spasms  . Tape   :   Her Current Medications Are:  Outpatient Encounter Prescriptions as of 11/15/2012  Medication Sig Dispense Refill  . amitriptyline (ELAVIL) 50 MG tablet Take 50 mg by mouth at bedtime as needed. For migraines.      . methocarbamol (ROBAXIN) 500 MG tablet Take 500 mg by mouth every 6 (six) hours as needed. For muscle spasms.      Marland Kitchen albuterol (PROVENTIL HFA;VENTOLIN HFA) 108 (90 BASE) MCG/ACT inhaler Inhale 2 puffs into the lungs every 6 (six) hours as needed for wheezing.      Marland Kitchen aspirin-acetaminophen-caffeine (EXCEDRIN MIGRAINE) 250-250-65 MG per tablet Take 3 tablets by mouth 2 (two) times daily as needed for pain.      . calcium carbonate (TUMS EX) 750 MG chewable tablet Chew 1-2 tablets by mouth daily as needed.       . carisoprodol (SOMA) 350 MG tablet Take 1 tablet (350 mg total) by mouth 4 (four) times daily as needed for muscle spasms.  40 tablet  1  . ciprofloxacin (CIPRO) 500 MG tablet Take 500 mg by mouth 2 (two) times daily. For UTI      . diazepam (VALIUM) 5 MG tablet Take 1 tablet (5 mg total) by mouth 2 (two) times daily.  10 tablet  0  . potassium chloride (K-DUR) 10 MEQ tablet Take 1  tablet (10 mEq total) by mouth 2 (two) times daily.  10 tablet  0   No facility-administered encounter medications on file as of 11/15/2012.  :  Review of Systems  Constitutional: Positive for fatigue and unexpected weight change (gain).  HENT: Positive for tinnitus.   Eyes: Positive for visual disturbance (blurred vision).  Cardiovascular: Positive for palpitations.  Gastrointestinal: Positive for constipation.  Endocrine: Positive for cold intolerance and heat intolerance.  Musculoskeletal:       Cramp  Neurological: Positive for dizziness, tremors, weakness and numbness.  Psychiatric/Behavioral: Positive for sleep disturbance (insomnia).    Objective:  Neurologic Exam  Physical Exam Physical Examination:   Filed Vitals:   11/15/12 1132  BP: 134/80  Pulse: 95  Temp: 98.4 F (36.9 C)    General Examination: The patient is a very pleasant 57 y.o. female in no acute distress. She is fairly quiet and appears anxious. She answers questions in very brief sentences and her husband, does not provide any history.   HEENT: Normocephalic, atraumatic, pupils are equal, round and reactive to light and accommodation. Funduscopic exam is normal with sharp disc margins noted. Extraocular tracking is good without nystagmus noted. Normal smooth pursuit is noted. Hearing is grossly intact. Face is symmetric with normal facial animation. Facial sensation: She reports decrease in pin prick sensation in the outer aspects of her cheeks, going from inner upper to lower outer aspect, no following V2 distribution. Her vibration sense in the forehead is decreased, when the tuning fork is tilted to R or L. Speech is clear with no dysarthria noted. There is no hypophonia per se, but she speaks somewhat softly with little intonation. There is no lip, neck or jaw tremor. Neck is supple with full range of motion. There are no carotid bruits on auscultation. Oropharynx exam reveals normal findings. No significant  airway crowding is noted. Tongue protrudes centrally and palate elevates symmetrically.   Chest: is clear to auscultation without wheezing, rhonchi or crackles noted.  Heart: sounds are regular and normal without murmurs, rubs or gallops noted.   Abdomen: is soft, non-tender and non-distended with normal bowel sounds appreciated on auscultation.  Extremities: There is no pitting edema in the distal lower extremities bilaterally.   Skin: is warm and dry with no trophic changes noted.  Musculoskeletal: exam reveals no obvious joint deformities, tenderness or joint swelling or erythema. She reports back pain upon standing.   Neurologically:  Mental status: The patient is awake, alert and oriented in all 4 spheres. Her memory, attention, language and knowledge are appropriate. There is no aphasia, agnosia, apraxia or anomia. Speech is clear, except as described above. Affect is flat.  Cranial nerves are as described above under HEENT exam. In addition, shoulder shrug is normal with equal shoulder height noted. Motor exam: Normal bulk, and tone is noted. There is no drift, tremor or rebound. Romberg is negative. Strength is 5+ with encouragement for effort. There is mild giveaway is weakness in both hip flexors and deltoids. Reflexes are 2+ throughout. Toes are downgoing bilaterally. Fine motor skills are intact with normal finger taps, normal hand movements, normal rapid alternating patting, normal foot taps and normal foot agility.  Cerebellar testing shows no dysmetria or intention tremor on finger to nose testing. Heel to shin is unremarkable. There is no truncal or gait ataxia.  Sensory exam is decreased to pinprick in the distal lower extremities left worse than right. She also has decreased temperature and PP sense in the medial aspect of the left leg and in the lateral aspect of the R leg. She has normal vibration sense in the distal lower extremities b/l. She has normal light touch sensation  throughout.  Gait, station and balance are unremarkable with the exception of a slight limp on the left. She is able to pull herself up on her toes and heels. She  is able to perform the tandem walk.  Gait, station and balance are unremarkable. No veering to one side is noted. No leaning to one side. Posture is age-appropriate and stance is narrow based. No problems turning are noted. She turns in 2 steps.   Assessment and Plan:   In summary, Ms. Lebron is a 57 year old lady with a 2 month Hx of paresthesias, numbness, pain and weakness affecting all 4 extremities, lower extremities more than upper extremities, left perhaps more than right. She feels that her condition is progressive. Her exam has changed a little bit with respect to sensory deficits but she does not have much in the way of weakness, when she exerts maximal effort. She did have 2 episodes of urinary incontinence last month which did not recur. I am a little puzzled with her presentation as her complaints seem to be out of proportion to her objective presentation. Thus far testing has included laboratory workup for inflammatory markers, MRI of brain and MRA and rheumatological labs, all of which have been nonrevealing with the exception of a positive ANA, which in and of itself does not explain her symptoms. I have talked to her and her husband at length last time and he tolerated that the list of differential diagnoses is long and includes peripheral neuropathy, some form of inflammatory or autoimmune process, less likely a muscle problem or hereditary problem. I suggested EMG and nerve conduction studies, CK and CK-MB, aldolase today as well as a second opinion with one of my colleagues. She and her husband were in agreement. I will see her back after these tests and consultations are done. We may proceed with CSF testing if the need arises.

## 2012-11-15 NOTE — Patient Instructions (Signed)
We are doing more testing: we will check ck, ck-mb, aldolase. We will do EMG/NCV and I am requesting a second opinion with either Dr. Anne Hahn or Dr. Terrace Arabia. Follow-up with me in 2 months. We will talk on the phone in the interim.

## 2012-11-17 LAB — CK TOTAL AND CKMB (NOT AT ARMC)
CK-MB Index: 1.5 ng/mL (ref 0.0–2.9)
Total CK: 69 U/L (ref 24–173)

## 2012-11-17 NOTE — Progress Notes (Signed)
Quick Note:  Spoke with patient and relayed results of blood work. Patient understood and had no questions.  ______ 

## 2012-11-17 NOTE — Progress Notes (Signed)
Quick Note:  Please call and advise the patient that the recent labs we checked were within normal limits. We checked CK, CKMB and aldolase to check for evidence of muscle break down, but the test were normal. Please remind patient to keep any upcoming appointments and call with any interim questions, concerns, problems or updates. Thanks,  Huston Foley, MD, PhD   ______

## 2012-11-19 ENCOUNTER — Encounter (INDEPENDENT_AMBULATORY_CARE_PROVIDER_SITE_OTHER): Payer: 59

## 2012-11-19 ENCOUNTER — Ambulatory Visit (INDEPENDENT_AMBULATORY_CARE_PROVIDER_SITE_OTHER): Payer: 59 | Admitting: Neurology

## 2012-11-19 DIAGNOSIS — M79609 Pain in unspecified limb: Secondary | ICD-10-CM

## 2012-11-19 DIAGNOSIS — Z0289 Encounter for other administrative examinations: Secondary | ICD-10-CM

## 2012-11-19 DIAGNOSIS — R29898 Other symptoms and signs involving the musculoskeletal system: Secondary | ICD-10-CM

## 2012-11-19 NOTE — Procedures (Signed)
HISTORY:  Melissa Blackburn is a 57 year old white female with a history of generalized weakness that began in February 2014. The patient has had some cramping in the muscles since the summer of 2013. The patient is now experiencing severe fatigue, and increased muscle cramps. The patient is being evaluated for the weakness and fatigue.  NERVE CONDUCTION STUDIES:  Nerve conduction studies were performed on right upper extremity. The distal motor latencies and motor amplitudes for the median and ulnar nerves were within normal limits. The F wave latencies and nerve conduction velocities for these nerves were also normal. The sensory latencies for the median and ulnar nerves were normal.  Nerve conduction studies were performed on both lower extremities. The distal motor latencies and motor amplitudes for the peroneal and posterior tibial nerves were within normal limits. The nerve conduction velocities for these nerves were also normal. The H reflex latencies were normal. The sensory latencies for the peroneal nerves were within normal limits.    EMG STUDIES:  EMG study was performed on the right upper extremity:  The first dorsal interosseous muscle reveals 2 to 4 K units with full recruitment. No fibrillations or positive waves were noted. The abductor pollicis brevis muscle reveals 2 to 4 K units with full recruitment. No fibrillations or positive waves were noted. The extensor indicis proprius muscle reveals 1 to 3 K units with full recruitment. No fibrillations or positive waves were noted. The pronator teres muscle reveals 2 to 3 K units with full recruitment. No fibrillations or positive waves were noted. The biceps muscle reveals 1 to 2 K units with full recruitment. No fibrillations or positive waves were noted. The triceps muscle reveals 2 to 4 K units with full recruitment. No fibrillations or positive waves were noted. The anterior deltoid muscle reveals 2 to 3 K units with full  recruitment. No fibrillations or positive waves were noted. The cervical paraspinal muscles were tested at 2 levels. No abnormalities of insertional activity were seen at either level tested. There was good relaxation.  EMG study was performed on the left lower extremity:  The tibialis anterior muscle reveals 2 to 4K motor units with full recruitment. No fibrillations or positive waves were seen. The peroneus tertius muscle reveals 2 to 4K motor units with full recruitment. No fibrillations or positive waves were seen. The medial gastrocnemius muscle reveals 1 to 3K motor units with full recruitment. 2 plus fibrillations or positive waves were seen. The vastus lateralis muscle reveals 2 to 4K motor units with full recruitment. No fibrillations or positive waves were seen. The iliopsoas muscle reveals 2 to 4K motor units with full recruitment. No fibrillations or positive waves were seen. The biceps femoris muscle (long head) reveals 2 to 4K motor units with full recruitment. No fibrillations or positive waves were seen. The lumbosacral paraspinal muscles were tested at 3 levels, and revealed no abnormalities of insertional activity at all 3 levels tested. There was good relaxation.    IMPRESSION:  Nerve conduction studies done on both lower extremities and on the right upper extremity were unremarkable. There is no evidence of a peripheral neuropathy. EMG evaluation of the right upper extremity was unremarkable, without evidence of a cervical radiculopathy. EMG of the left lower extremity shows isolated denervation of the medial gastrocnemius muscle, without other abnormalities seen. There is no clear evidence of an overlying lumbosacral radiculopathy. The clinical significance of the isolated denervation of the medial gastrocnemius muscle is unclear. There is no evidence of a primary myopathic  disorder.  Marlan Palau MD 11/19/2012 1:58 PM  [facility] [address]  [address] [place], [place] [address]  Phone [phone] Fax [phone]

## 2012-11-19 NOTE — Progress Notes (Signed)
Quick Note:  Please call and advise the patient that the recent nerve and muscle tests, also known as EMG and nerve conduction velocity testing, were reported as within normal limits with the exception of: isolated denervation of the medial gastrocnemius muscle, without other  abnormalities seen. That means that Dr. Anne Hahn saw a minor abnormality in her L calf muscle. The clinical significance of the  isolated denervation of the medial gastrocnemius muscle is  Unclear and does not explain her symptoms. Dr. Anne Hahn felt, that there is no evidence of a primary myopathic disorder, i.e. Muscle disorder.   Please remind patient to keep any upcoming appointments and call with any interim questions, concerns, problems or updates. Thanks,  Huston Foley, MD, PhD    ______

## 2012-11-22 NOTE — Progress Notes (Signed)
Quick Note:  Spoke with patient and relayed results of her EMG. Patient understood and had no questions. She has a follow-up with Dr Anne Hahn on 11-23-12. ______

## 2012-11-23 ENCOUNTER — Ambulatory Visit (INDEPENDENT_AMBULATORY_CARE_PROVIDER_SITE_OTHER): Payer: 59 | Admitting: Neurology

## 2012-11-23 ENCOUNTER — Encounter: Payer: Self-pay | Admitting: Neurology

## 2012-11-23 VITALS — BP 148/88 | HR 100 | Ht 70.5 in | Wt 180.0 lb

## 2012-11-23 DIAGNOSIS — R209 Unspecified disturbances of skin sensation: Secondary | ICD-10-CM

## 2012-11-23 DIAGNOSIS — R5381 Other malaise: Secondary | ICD-10-CM

## 2012-11-23 DIAGNOSIS — R202 Paresthesia of skin: Secondary | ICD-10-CM

## 2012-11-23 DIAGNOSIS — R531 Weakness: Secondary | ICD-10-CM

## 2012-11-23 DIAGNOSIS — M79609 Pain in unspecified limb: Secondary | ICD-10-CM

## 2012-11-23 NOTE — Progress Notes (Signed)
Reason for visit: Fatigue, sensory changes  Melissa Blackburn is a 57 y.o. female  History of present illness:  Melissa Blackburn is a 57 year old right-handed white female with a history of problems with muscle cramps that began in the summer of 2013. The patient was having an occasional muscle cramp at that time, but she has begun having more significant issues with this beginning towards the latter part of February 2014. On February 26, she awakened with numbness in the legs and feet. The numbness has spread to include the arms, and sometimes around the mouth and the left face. The patient may have an electrical shock sensation at times. The patient reports a sensation of fatigue that is present later in the day. The patient has also noted some instability of her blood pressures with wide variations. The patient has undergone MRI and MRA evaluation of the brain, and MRI of the neck. This was unremarkable. No evidence of demyelinating disease was seen. Blood work evaluations have included a CK enzyme level, ANA, Sjogren's antibody, sedimentation rate, C-reactive protein. This was all unremarkable. The patient has had some problems with constipation, and 2 episodes of urinary incontinence. The patient reports that in the fall of 2013, she developed a cough that has been persistent. The patient denies any fevers or chills. The patient has undergone nerve conduction study and EMG evaluation that were completely normal. The patient is sent back for an evaluation. The patient has suffered from chronic daily headaches for many years. The patient denies any problems with mentation or memory.  Past Medical History  Diagnosis Date  . Migraine   . DDD (degenerative disc disease)   . Complication of anesthesia   . PONV (postoperative nausea and vomiting)     "severe nausea and vomiting"  . Asthma     as a child  . Pneumonia     hx of  . Sleep apnea     cpap 7  . Anemia     hx of  . Degenerative disc disease    . Needle stick injury with contaminated needle     patient undergoing testing  . Weakness generalized 11/15/2012  . Paresthesias 11/15/2012  . Pain in limb 11/15/2012  . Numbness and tingling 11/15/2012  . Migraine     Past Surgical History  Procedure Laterality Date  . Abdominal hysterectomy    . Rectocele repair    . Shoulder surgery      x 5  . Knee surgery      x 4  . Knee arthroscopy  04/23/2012    Procedure: ARTHROSCOPY KNEE;  Surgeon: Verlee Rossetti, MD;  Location[facility];  Service: Orthopedics;  Laterality: Left;  lateral meniscectomy    Family History  Problem Relation Age of Onset  . Cancer Mother     Breast cancer  . Hypertension Father   . COPD Father   . Coronary artery disease Father   . Cancer Maternal Grandmother     Bone cancer  . Coronary artery disease Maternal Grandfather     Social history:  reports that she has never smoked. She has never used smokeless tobacco. She reports that she does not drink alcohol or use illicit drugs.  Medications:  Current Outpatient Prescriptions on File Prior to Visit  Medication Sig Dispense Refill  . amitriptyline (ELAVIL) 50 MG tablet Take 50 mg by mouth at bedtime as needed. For migraines.      . methocarbamol (ROBAXIN) 500 MG tablet Take 500 mg  by mouth every 6 (six) hours as needed. For muscle spasms.       No current facility-administered medications on file prior to visit.    Allergies:  Allergies  Allergen Reactions  . Verapamil Anaphylaxis  . Avelox (Moxifloxacin Hcl In Nacl) Diarrhea  . Flexeril (Cyclobenzaprine Hcl) Other (See Comments)    Systolic Levels "bottoms out"  . Midazolam Hcl Nausea And Vomiting  . Neurontin (Gabapentin) Other (See Comments)    Muscle spasms  . Pregabalin Other (See Comments)    Muscle spasms  . Tape     ROS:  Out of a complete 14 system review of symptoms, the patient complains only of the following symptoms, and all other reviewed systems are negative.  Weight  gain Palpitations Ringing in the ears Blurred vision Feeling hot, cold Constipation Muscle cramps Headache, numbness, weakness, dizziness, tremor Insomnia  Blood pressure 148/88, pulse 100, height 5' 10.5" (1.791 m), weight 180 lb (81.647 kg).  Physical Exam  General: The patient is alert and cooperative at the time of the examination. The patient is minimally obese.  Head: Pupils are equal, round, and reactive to light. Discs are flat bilaterally.  Neck: The neck is supple, no carotid bruits are noted.  Respiratory: The respiratory examination is clear.  Cardiovascular: The cardiovascular examination reveals a regular rate and rhythm, no obvious murmurs or rubs are noted.   Neuromuscular: The patient has excellent range of motion of the cervical and lumbosacral spines.  Skin: Extremities are without significant edema.  Neurologic Exam  Mental status:  Cranial nerves: Facial symmetry is present. There is good sensation of the face to pinprick and soft touch bilaterally. The strength of the facial muscles and the muscles to head turning and shoulder shrug are normal bilaterally. Speech is well enunciated, no aphasia or dysarthria is noted. Extraocular movements are full. Visual fields are full.  Motor: The motor testing reveals 5 over 5 strength of all 4 extremities. Good symmetric motor tone is noted throughout.  Sensory: Sensory testing is intact to pinprick, soft touch, vibration sensation, and position sense on all 4 extremities. No evidence of extinction is noted.  Coordination: Cerebellar testing reveals good finger-nose-finger and heel-to-shin bilaterally.  Gait and station: Gait is normal. Tandem gait is normal. Romberg is negative. No drift is seen.  Reflexes: Deep tendon reflexes are symmetric and normal bilaterally. Toes are downgoing bilaterally.   Assessment/Plan:  1. Sensory alterations, muscle cramps, fatigue  The patient has several nonspecific  complaints, and clinical examination is completely normal. The patient will undergo further workup with blood work. The patient will need to be evaluated for stiff person syndrome, Lyme disease. The patient may be considered for lumbar puncture in the future, but I think that this will likely be of low yield. It is possible that the patient is developing a sensory variant of fibromyalgia. Once again, the patient is objectively normal on clinical examination. The patient will followup in 2 or 3 months.  Marlan Palau MD 11/23/2012 6:41 PM  [facility] [address] [place], [place] [address]  Phone [phone] Fax [phone]

## 2012-11-25 LAB — EPSTEIN-BARR VIRUS VCA ANTIBODY PANEL
EBV AB VCA, IGG: >600 U/mL — ABNORMAL HIGH (ref 0.0–17.9)
EBV AB VCA, IGM: <36 U/mL (ref 0.0–35.9)
EBV Early Antigen Ab, IgG: 18.9 U/mL — ABNORMAL HIGH (ref 0.0–8.9)

## 2012-11-25 LAB — MYCOPLASMA PNEUMONIAE,IGG,IGM
Mycoplasma pneumo IgG: 140 U/mL — ABNORMAL HIGH (ref 0–99)
Mycoplasma pneumo IgM: <770 U/mL (ref 0–769)

## 2012-11-25 LAB — ACETYLCHOLINE RECEPTOR, BINDING: AChR Binding Ab, Serum: <0.03 nmol/L (ref 0.00–0.24)

## 2012-11-29 ENCOUNTER — Telehealth: Payer: Self-pay | Admitting: Neurology

## 2012-11-29 NOTE — Telephone Encounter (Signed)
I called patient. I discussed the results of the blood work with her. 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 The mycoplasma IgG antibody was positive, IgM negative. The patient has had a past infection with the mycoplasma, I am not sure that this is currently active although the patient indicates that she has an ongoing cough. The rest of blood was unremarkable.

## 2013-04-28 ENCOUNTER — Other Ambulatory Visit (HOSPITAL_COMMUNITY): Payer: Self-pay | Admitting: Orthopedic Surgery

## 2013-04-28 DIAGNOSIS — M25562 Pain in left knee: Secondary | ICD-10-CM

## 2013-05-03 ENCOUNTER — Ambulatory Visit (HOSPITAL_COMMUNITY)
Admission: RE | Admit: 2013-05-03 | Discharge: 2013-05-03 | Disposition: A | Discharge status: Home / Self Care | Payer: 59 | Source: Ambulatory Visit | Attending: Orthopedic Surgery | Admitting: Orthopedic Surgery

## 2013-05-03 DIAGNOSIS — M25562 Pain in left knee: Secondary | ICD-10-CM

## 2013-05-04 ENCOUNTER — Ambulatory Visit (HOSPITAL_COMMUNITY)
Admission: EM | Admit: 2013-05-04 | Discharge: 2013-05-04 | Disposition: A | Discharge status: Home / Self Care | Payer: 59 | Source: Ambulatory Visit | Attending: Internal Medicine | Admitting: Internal Medicine

## 2013-05-04 ENCOUNTER — Other Ambulatory Visit (HOSPITAL_COMMUNITY): Payer: Self-pay | Admitting: Internal Medicine

## 2013-05-04 ENCOUNTER — Ambulatory Visit (HOSPITAL_COMMUNITY)
Admission: RE | Admit: 2013-05-04 | Discharge: 2013-05-04 | Disposition: A | Discharge status: Home / Self Care | Payer: 59 | Source: Ambulatory Visit | Attending: Internal Medicine | Admitting: Internal Medicine

## 2013-05-04 DIAGNOSIS — M25572 Pain in left ankle and joints of left foot: Secondary | ICD-10-CM

## 2013-05-04 DIAGNOSIS — M7730 Calcaneal spur, unspecified foot: Secondary | ICD-10-CM | POA: Insufficient documentation

## 2013-05-04 DIAGNOSIS — G8929 Other chronic pain: Secondary | ICD-10-CM

## 2013-05-04 DIAGNOSIS — R52 Pain, unspecified: Secondary | ICD-10-CM

## 2013-05-04 DIAGNOSIS — M25579 Pain in unspecified ankle and joints of unspecified foot: Secondary | ICD-10-CM

## 2013-05-05 ENCOUNTER — Ambulatory Visit (HOSPITAL_COMMUNITY): Payer: 59

## 2013-05-09 ENCOUNTER — Ambulatory Visit (HOSPITAL_COMMUNITY)
Admission: RE | Admit: 2013-05-09 | Discharge: 2013-05-09 | Disposition: A | Discharge status: Home / Self Care | Payer: 59 | Source: Ambulatory Visit | Attending: Orthopedic Surgery | Admitting: Orthopedic Surgery

## 2013-05-09 DIAGNOSIS — W19XXXA Unspecified fall, initial encounter: Secondary | ICD-10-CM | POA: Insufficient documentation

## 2013-05-09 DIAGNOSIS — S8990XA Unspecified injury of unspecified lower leg, initial encounter: Secondary | ICD-10-CM | POA: Insufficient documentation

## 2013-05-09 DIAGNOSIS — M898X9 Other specified disorders of bone, unspecified site: Secondary | ICD-10-CM | POA: Insufficient documentation

## 2013-05-09 DIAGNOSIS — M25569 Pain in unspecified knee: Secondary | ICD-10-CM | POA: Insufficient documentation

## 2013-06-16 ENCOUNTER — Other Ambulatory Visit: Payer: Self-pay

## 2013-09-01 ENCOUNTER — Other Ambulatory Visit (HOSPITAL_COMMUNITY): Payer: Self-pay | Admitting: Internal Medicine

## 2013-09-01 DIAGNOSIS — Z139 Encounter for screening, unspecified: Secondary | ICD-10-CM

## 2013-09-05 ENCOUNTER — Ambulatory Visit (HOSPITAL_COMMUNITY)
Admission: RE | Admit: 2013-09-05 | Discharge: 2013-09-05 | Disposition: A | Discharge status: Home / Self Care | Payer: 59 | Source: Ambulatory Visit | Attending: Internal Medicine | Admitting: Internal Medicine

## 2013-09-05 DIAGNOSIS — Z1231 Encounter for screening mammogram for malignant neoplasm of breast: Secondary | ICD-10-CM | POA: Insufficient documentation

## 2013-09-05 DIAGNOSIS — Z139 Encounter for screening, unspecified: Secondary | ICD-10-CM

## 2014-02-06 ENCOUNTER — Other Ambulatory Visit (HOSPITAL_COMMUNITY): Payer: Self-pay | Admitting: Internal Medicine

## 2014-02-06 ENCOUNTER — Ambulatory Visit (HOSPITAL_COMMUNITY)
Admission: RE | Admit: 2014-02-06 | Discharge: 2014-02-06 | Disposition: A | Discharge status: Home / Self Care | Payer: 59 | Source: Ambulatory Visit | Attending: Internal Medicine | Admitting: Internal Medicine

## 2014-02-06 DIAGNOSIS — S62102A Fracture of unspecified carpal bone, left wrist, initial encounter for closed fracture: Secondary | ICD-10-CM

## 2014-02-06 DIAGNOSIS — Y929 Unspecified place or not applicable: Secondary | ICD-10-CM | POA: Insufficient documentation

## 2014-02-06 DIAGNOSIS — W19XXXA Unspecified fall, initial encounter: Secondary | ICD-10-CM | POA: Insufficient documentation

## 2014-02-06 DIAGNOSIS — M25539 Pain in unspecified wrist: Secondary | ICD-10-CM | POA: Insufficient documentation

## 2014-03-30 ENCOUNTER — Other Ambulatory Visit (HOSPITAL_COMMUNITY): Payer: Self-pay | Admitting: Orthopedic Surgery

## 2014-03-30 DIAGNOSIS — M25532 Pain in left wrist: Secondary | ICD-10-CM

## 2014-03-30 DIAGNOSIS — M25572 Pain in left ankle and joints of left foot: Secondary | ICD-10-CM

## 2014-04-04 ENCOUNTER — Ambulatory Visit (HOSPITAL_COMMUNITY): Payer: 59

## 2014-04-05 ENCOUNTER — Ambulatory Visit (HOSPITAL_COMMUNITY)
Admission: RE | Admit: 2014-04-05 | Discharge: 2014-04-05 | Disposition: A | Discharge status: Home / Self Care | Payer: 59 | Source: Ambulatory Visit | Attending: Orthopedic Surgery | Admitting: Orthopedic Surgery

## 2014-04-05 ENCOUNTER — Ambulatory Visit (HOSPITAL_COMMUNITY): Payer: 59

## 2014-04-05 DIAGNOSIS — M25539 Pain in unspecified wrist: Secondary | ICD-10-CM | POA: Diagnosis present

## 2014-04-05 DIAGNOSIS — W19XXXA Unspecified fall, initial encounter: Secondary | ICD-10-CM | POA: Insufficient documentation

## 2014-04-05 DIAGNOSIS — M25532 Pain in left wrist: Secondary | ICD-10-CM

## 2014-04-05 DIAGNOSIS — S63599A Other specified sprain of unspecified wrist, initial encounter: Secondary | ICD-10-CM | POA: Diagnosis not present

## 2014-04-05 DIAGNOSIS — M65849 Other synovitis and tenosynovitis, unspecified hand: Secondary | ICD-10-CM

## 2014-04-05 DIAGNOSIS — M25572 Pain in left ankle and joints of left foot: Secondary | ICD-10-CM

## 2014-04-05 DIAGNOSIS — S66819A Strain of other specified muscles, fascia and tendons at wrist and hand level, unspecified hand, initial encounter: Principal | ICD-10-CM

## 2014-04-05 DIAGNOSIS — M25439 Effusion, unspecified wrist: Secondary | ICD-10-CM | POA: Insufficient documentation

## 2014-04-05 DIAGNOSIS — M65839 Other synovitis and tenosynovitis, unspecified forearm: Secondary | ICD-10-CM | POA: Diagnosis not present

## 2014-06-12 ENCOUNTER — Encounter: Payer: Self-pay | Admitting: Neurology

## 2014-07-13 ENCOUNTER — Ambulatory Visit (HOSPITAL_COMMUNITY)
Admission: RE | Admit: 2014-07-13 | Discharge: 2014-07-13 | Disposition: A | Discharge status: Home / Self Care | Payer: 59 | Source: Ambulatory Visit | Attending: Internal Medicine | Admitting: Internal Medicine

## 2014-07-13 ENCOUNTER — Other Ambulatory Visit (HOSPITAL_COMMUNITY): Payer: Self-pay | Admitting: Internal Medicine

## 2014-07-13 DIAGNOSIS — K810 Acute cholecystitis: Secondary | ICD-10-CM

## 2014-07-13 DIAGNOSIS — R1011 Right upper quadrant pain: Secondary | ICD-10-CM | POA: Diagnosis not present

## 2014-07-17 ENCOUNTER — Other Ambulatory Visit (HOSPITAL_COMMUNITY): Payer: Self-pay | Admitting: Internal Medicine

## 2014-07-17 DIAGNOSIS — R1084 Generalized abdominal pain: Secondary | ICD-10-CM

## 2014-07-19 ENCOUNTER — Other Ambulatory Visit (HOSPITAL_COMMUNITY): Payer: Self-pay

## 2014-07-26 ENCOUNTER — Encounter (HOSPITAL_COMMUNITY): Payer: Self-pay

## 2014-07-26 ENCOUNTER — Encounter (HOSPITAL_COMMUNITY)
Admission: RE | Admit: 2014-07-26 | Discharge: 2014-07-26 | Disposition: A | Discharge status: Home / Self Care | Payer: 59 | Source: Ambulatory Visit | Attending: Internal Medicine | Admitting: Internal Medicine

## 2014-07-26 DIAGNOSIS — R1084 Generalized abdominal pain: Secondary | ICD-10-CM | POA: Insufficient documentation

## 2014-07-26 MED ORDER — TECHNETIUM TC 99M MEBROFENIN IV KIT
5.0000 | PACK | Freq: Once | INTRAVENOUS | Status: AC | PRN
  Administered 2014-07-26: 5 via INTRAVENOUS

## 2014-07-26 MED ORDER — SODIUM CHLORIDE 0.9 % IJ SOLN
INTRAMUSCULAR | Status: AC
  Filled 2014-07-26: qty 30

## 2014-07-26 MED ORDER — SINCALIDE 5 MCG IJ SOLR
INTRAMUSCULAR | Status: AC
  Administered 2014-07-26: 1.68 ug
  Filled 2014-07-26: qty 5

## 2014-07-26 MED ORDER — STERILE WATER FOR INJECTION IJ SOLN
INTRAMUSCULAR | Status: AC
  Administered 2014-07-26: 1.68 mL
  Filled 2014-07-26: qty 10

## 2014-08-09 ENCOUNTER — Encounter (INDEPENDENT_AMBULATORY_CARE_PROVIDER_SITE_OTHER): Payer: Self-pay | Admitting: *Deleted

## 2014-09-26 ENCOUNTER — Ambulatory Visit (INDEPENDENT_AMBULATORY_CARE_PROVIDER_SITE_OTHER): Payer: Self-pay | Admitting: Internal Medicine

## 2015-08-28 MED FILL — BACLOFEN 10 MG TABLET: 10 | 30 days supply | Qty: 90 | Fill #1

## 2015-08-28 MED FILL — AMITRIPTYLINE HCL 50 MG TAB: 50 | 30 days supply | Qty: 30 | Fill #3

## 2015-09-24 MED FILL — AMITRIPTYLINE HCL 50 MG TAB: 50 | 30 days supply | Qty: 30 | Fill #4

## 2015-10-11 DIAGNOSIS — G43119 Migraine with aura, intractable, without status migrainosus: Secondary | ICD-10-CM | POA: Diagnosis not present

## 2015-10-11 DIAGNOSIS — G4733 Obstructive sleep apnea (adult) (pediatric): Secondary | ICD-10-CM | POA: Diagnosis not present

## 2015-10-11 DIAGNOSIS — M5416 Radiculopathy, lumbar region: Secondary | ICD-10-CM | POA: Diagnosis not present

## 2015-10-11 DIAGNOSIS — G44201 Tension-type headache, unspecified, intractable: Secondary | ICD-10-CM | POA: Diagnosis not present

## 2015-10-31 ENCOUNTER — Other Ambulatory Visit (HOSPITAL_COMMUNITY): Payer: Self-pay | Admitting: Internal Medicine

## 2015-10-31 ENCOUNTER — Ambulatory Visit (HOSPITAL_COMMUNITY)
Admission: RE | Admit: 2015-10-31 | Discharge: 2015-10-31 | Disposition: A | Discharge status: Home / Self Care | Payer: Self-pay | Source: Ambulatory Visit | Attending: Internal Medicine | Admitting: Internal Medicine

## 2015-10-31 ENCOUNTER — Encounter (HOSPITAL_COMMUNITY): Payer: Self-pay

## 2015-10-31 ENCOUNTER — Ambulatory Visit (HOSPITAL_COMMUNITY)
Admission: RE | Admit: 2015-10-31 | Discharge: 2015-10-31 | Disposition: A | Discharge status: Home / Self Care | Payer: 59 | Source: Ambulatory Visit | Attending: Internal Medicine | Admitting: Internal Medicine

## 2015-10-31 DIAGNOSIS — IMO0001 Reserved for inherently not codable concepts without codable children: Secondary | ICD-10-CM

## 2015-10-31 DIAGNOSIS — N200 Calculus of kidney: Secondary | ICD-10-CM

## 2015-10-31 DIAGNOSIS — R319 Hematuria, unspecified: Secondary | ICD-10-CM | POA: Diagnosis not present

## 2015-10-31 DIAGNOSIS — M5136 Other intervertebral disc degeneration, lumbar region: Secondary | ICD-10-CM | POA: Diagnosis not present

## 2015-10-31 DIAGNOSIS — K5730 Diverticulosis of large intestine without perforation or abscess without bleeding: Secondary | ICD-10-CM | POA: Insufficient documentation

## 2015-10-31 DIAGNOSIS — R310 Gross hematuria: Secondary | ICD-10-CM | POA: Diagnosis not present

## 2015-10-31 DIAGNOSIS — R109 Unspecified abdominal pain: Secondary | ICD-10-CM | POA: Insufficient documentation

## 2015-10-31 DIAGNOSIS — R350 Frequency of micturition: Secondary | ICD-10-CM | POA: Diagnosis not present

## 2015-10-31 DIAGNOSIS — R1084 Generalized abdominal pain: Secondary | ICD-10-CM | POA: Diagnosis not present

## 2015-11-01 DIAGNOSIS — H2513 Age-related nuclear cataract, bilateral: Secondary | ICD-10-CM | POA: Diagnosis not present

## 2015-11-01 DIAGNOSIS — D3132 Benign neoplasm of left choroid: Secondary | ICD-10-CM | POA: Diagnosis not present

## 2015-11-13 ENCOUNTER — Encounter (INDEPENDENT_AMBULATORY_CARE_PROVIDER_SITE_OTHER): Payer: Self-pay | Admitting: *Deleted

## 2015-11-27 ENCOUNTER — Ambulatory Visit (INDEPENDENT_AMBULATORY_CARE_PROVIDER_SITE_OTHER): Payer: 59 | Admitting: Internal Medicine

## 2015-11-27 ENCOUNTER — Encounter (INDEPENDENT_AMBULATORY_CARE_PROVIDER_SITE_OTHER): Payer: Self-pay | Admitting: *Deleted

## 2015-11-27 ENCOUNTER — Encounter (INDEPENDENT_AMBULATORY_CARE_PROVIDER_SITE_OTHER): Payer: Self-pay | Admitting: Internal Medicine

## 2015-11-27 ENCOUNTER — Encounter (INDEPENDENT_AMBULATORY_CARE_PROVIDER_SITE_OTHER): Payer: Self-pay

## 2015-11-27 VITALS — BP 160/90 | HR 80 | Temp 97.9°F | Ht 71.0 in | Wt 183.5 lb

## 2015-11-27 DIAGNOSIS — R932 Abnormal findings on diagnostic imaging of liver and biliary tract: Secondary | ICD-10-CM | POA: Diagnosis not present

## 2015-11-27 LAB — HEPATIC FUNCTION PANEL
ALBUMIN: 3.9 g/dL (ref 3.6–5.1)
ALT: 53 U/L — AB (ref 6–29)
AST: 26 U/L (ref 10–35)
Alkaline Phosphatase: 121 U/L (ref 33–130)
BILIRUBIN DIRECT: 0.1 mg/dL (ref <=0.2)
Indirect Bilirubin: 0.2 mg/dL (ref 0.2–1.2)
Total Bilirubin: 0.3 mg/dL (ref 0.2–1.2)
Total Protein: 6.4 g/dL (ref 6.1–8.1)

## 2015-11-27 NOTE — Patient Instructions (Addendum)
CT abdomen/pelvis with CM. Further recommendations to follow 

## 2015-11-27 NOTE — Progress Notes (Addendum)
Subjective:    Patient ID: Melissa Blackburn, female    DOB: October 28, 1955, 60 y.o.   MRN: [id number]  HPI  Referred by Dr. Wende Neighbors for possible pancreatitis. Underwent a CT scan w/o CM for rt flank pain, nausea x 1 month.  Negative for a stone. CT scan revealed indistinctness of the pancreatic head with some peripancreatic strandiness may indicate mild acute pancreatitis.  Amylase and lipase were normal.  She was not hurting in her left upper abdomen. Today she states she still has rt back pain radiating around to rt flank. She s has abdominal bloating and nausea.    After eating she becomes nauseated.  Her appetite is good. No weight loss. She does have some constipation. She has a BM about every 3-4 days with a laxative. No melena or BRRB.  Her last colonoscopy was in 2011 in [place] and she had one adenomatous polyp.   Next colonoscopy in 5 yrs. (Dr. West Carbo)    10/31/2015 CT Renal study w/o cm: rt flank pain, nausea x 1 month  IMPRESSION: 1. Indistinctnes of the pancreatic head with some peripancreatic strandiness may indicate mild acute pancreatitis. Correlate with laboratory data. 2. No renal or ureteral calculi are noted. There is no evidence of hydronephrosis. 3. The appendix and terminal ileum are unremarkable. 4. Degenerative disc disease at L5-S1. 5. Rectosigmoid colon diverticula.  No diverticulitis.  10/31/2015 WBC 3.9, H and H 13.0 AND 40.0, mcv 90.5, TOTAL BILI  0.4, AST 26 ALP 126, ALT 36, Lipase 24, Amylase 26.    Review of Systems Past Medical History  Diagnosis Date  . Migraine   . DDD (degenerative disc disease)   . Complication of anesthesia   . PONV (postoperative nausea and vomiting)     "severe nausea and vomiting"  . Asthma     as a child  . Pneumonia     hx of  . Sleep apnea     cpap 7  . Anemia     hx of  . Degenerative disc disease   . Needle stick injury with contaminated needle     patient undergoing testing  . Weakness generalized 11/15/2012   . Paresthesias 11/15/2012  . Pain in limb 11/15/2012  . Numbness and tingling 11/15/2012  . Migraine     Past Surgical History  Procedure Laterality Date  . Abdominal hysterectomy    . Rectocele repair    . Shoulder surgery      x 5  . Knee surgery      x 4  . Knee arthroscopy  04/23/2012    Procedure: ARTHROSCOPY KNEE;  Surgeon: Augustin Schooling, MD;  Location: [place];  Service: Orthopedics;  Laterality: Left;  lateral meniscectomy    Allergies  Allergen Reactions  . Verapamil Anaphylaxis  . Avelox [Moxifloxacin Hcl In Nacl] Diarrhea  . Flexeril [Cyclobenzaprine Hcl] Other (See Comments)    Systolic Levels "bottoms out"  . Midazolam Hcl Nausea And Vomiting  . Neurontin [Gabapentin] Other (See Comments)    Muscle spasms  . Pregabalin Other (See Comments)    Muscle spasms  . Tape     Current Outpatient Prescriptions on File Prior to Visit  Medication Sig Dispense Refill  . amitriptyline (ELAVIL) 50 MG tablet Take 50 mg by mouth at bedtime as needed. For migraines.     No current facility-administered medications on file prior to visit.   Current Outpatient Prescriptions on File Prior to Visit  Medication Sig Dispense Refill  .  amitriptyline (ELAVIL) 50 MG tablet Take 50 mg by mouth at bedtime as needed. For migraines.     No current facility-administered medications on file prior to visit.   Current Outpatient Prescriptions  Medication Sig Dispense Refill  . amitriptyline (ELAVIL) 50 MG tablet Take 50 mg by mouth at bedtime as needed. For migraines.    Marland Kitchen aspirin-acetaminophen-caffeine (EXCEDRIN MIGRAINE) 250-250-65 MG tablet Take by mouth every 6 (six) hours as needed for headache.    . baclofen (LIORESAL) 10 MG tablet Take 10 mg by mouth 3 (three) times daily.    . mometasone (NASONEX) 50 MCG/ACT nasal spray Place 2 sprays into the nose daily.    . promethazine (PHENERGAN) 25 MG tablet Take 25 mg by mouth every 6 (six) hours as needed for nausea or vomiting.     No  current facility-administered medications for this visit.   Prednisone dose pack.        Objective:   Physical Exam  Filed Vitals:   11/27/15 0833  BP: 160/90  Pulse: 80  Temp: 97.9 F (36.6 C)   Alert and oriented. Skin warm and dry. Oral mucosa is moist.   . Sclera anicteric, conjunctivae is pink. Thyroid not enlarged. No cervical lymphadenopathy. Lungs clear. Heart regular rate and rhythm.  Abdomen is soft. Bowel sounds are positive. No hepatomegaly. No abdominal masses felt. Tenderness rt upper back.   No edema to lower extremities.        Assessment & Plan:  Rt back and flank pain. She is not having any left upper quadrant pain.  Nausea after eating.  Possible pancreatitis with normal lipase and amylase.  Plan: Follow up CT abdomen/pelvis with CM Hepatic function

## 2015-11-28 ENCOUNTER — Ambulatory Visit (HOSPITAL_COMMUNITY)
Admission: RE | Admit: 2015-11-28 | Discharge: 2015-11-28 | Disposition: A | Discharge status: Home / Self Care | Payer: 59 | Source: Ambulatory Visit | Attending: Internal Medicine | Admitting: Internal Medicine

## 2015-11-28 DIAGNOSIS — K7689 Other specified diseases of liver: Secondary | ICD-10-CM | POA: Diagnosis not present

## 2015-11-28 DIAGNOSIS — R109 Unspecified abdominal pain: Secondary | ICD-10-CM | POA: Diagnosis not present

## 2015-11-28 DIAGNOSIS — M5137 Other intervertebral disc degeneration, lumbosacral region: Secondary | ICD-10-CM | POA: Diagnosis not present

## 2015-11-28 DIAGNOSIS — G43719 Chronic migraine without aura, intractable, without status migrainosus: Secondary | ICD-10-CM | POA: Diagnosis not present

## 2015-11-28 DIAGNOSIS — R932 Abnormal findings on diagnostic imaging of liver and biliary tract: Secondary | ICD-10-CM | POA: Insufficient documentation

## 2015-11-28 MED ORDER — IOPAMIDOL (ISOVUE-300) INJECTION 61%
100.0000 mL | Freq: Once | INTRAVENOUS | Status: AC | PRN
  Administered 2015-11-28: 100 mL via INTRAVENOUS

## 2015-11-29 ENCOUNTER — Encounter (INDEPENDENT_AMBULATORY_CARE_PROVIDER_SITE_OTHER): Payer: Self-pay

## 2015-12-04 ENCOUNTER — Encounter (INDEPENDENT_AMBULATORY_CARE_PROVIDER_SITE_OTHER): Payer: Self-pay | Admitting: Internal Medicine

## 2015-12-04 MED FILL — AMITRIPTYLINE HCL 50 MG TAB: 50 | 30 days supply | Qty: 60 | Fill #0

## 2015-12-04 MED FILL — BACLOFEN 10 MG TABLET: 10 | 30 days supply | Qty: 90 | Fill #2

## 2015-12-04 MED FILL — PROMETHAZINE 25 MG TABLET: 25 | 10 days supply | Qty: 30 | Fill #2

## 2015-12-06 ENCOUNTER — Telehealth: Payer: Self-pay

## 2015-12-06 NOTE — Telephone Encounter (Signed)
-----   Message from Milus Banister, MD sent at 12/06/2015  7:22 AM EDT ----- Regarding: RE: EUS Najeeb, I agree there is a very unusual appearance to the head of her pancreas on her recent CT scan.  We'll contact her about EUS. Thanks  Ellesse Antenucci, She needs upper EUS, radial +/- linear, next available EUS Thursday with MAC sedation.    DJ  ----- Message -----    From: Barron Alvine, RN    Sent: 12/05/2015   4:54 PM      To: Milus Banister, MD Subject: Melton Alar: EUS                                          ----- Message -----    From: Worthy Keeler    Sent: 12/05/2015   4:43 PM      To: Barron Alvine, RN Subject: EUS                                            Patient needs EUS, possible pancreatitis, abnormal CT. All records [facility] in [facility]. Dr Laural Golden has spoken to Dr Ardis Hughs. Thanks, Lelon Frohlich

## 2015-12-10 ENCOUNTER — Encounter (INDEPENDENT_AMBULATORY_CARE_PROVIDER_SITE_OTHER): Payer: Self-pay | Admitting: Internal Medicine

## 2015-12-12 ENCOUNTER — Other Ambulatory Visit: Payer: Self-pay

## 2015-12-12 ENCOUNTER — Telehealth: Payer: Self-pay | Admitting: Gastroenterology

## 2015-12-12 DIAGNOSIS — R932 Abnormal findings on diagnostic imaging of liver and biliary tract: Secondary | ICD-10-CM

## 2015-12-12 NOTE — Telephone Encounter (Signed)
See alternate note  

## 2015-12-12 NOTE — Telephone Encounter (Signed)
Pt has been scheduled for 01/10/16 1130 am  EUS scheduled, pt instructed and medications reviewed.  Patient instructions mailed to home.  Patient to call with any questions or concerns.

## 2015-12-18 DIAGNOSIS — I10 Essential (primary) hypertension: Secondary | ICD-10-CM | POA: Diagnosis not present

## 2015-12-18 MED FILL — ATENOLOL 25 MG TABLET: 25 | 30 days supply | Qty: 30 | Fill #0

## 2016-01-01 ENCOUNTER — Encounter (HOSPITAL_COMMUNITY): Payer: Self-pay | Admitting: *Deleted

## 2016-01-09 DIAGNOSIS — G44201 Tension-type headache, unspecified, intractable: Secondary | ICD-10-CM | POA: Diagnosis not present

## 2016-01-09 DIAGNOSIS — G4733 Obstructive sleep apnea (adult) (pediatric): Secondary | ICD-10-CM | POA: Diagnosis not present

## 2016-01-09 DIAGNOSIS — M5416 Radiculopathy, lumbar region: Secondary | ICD-10-CM | POA: Diagnosis not present

## 2016-01-09 DIAGNOSIS — G43719 Chronic migraine without aura, intractable, without status migrainosus: Secondary | ICD-10-CM | POA: Diagnosis not present

## 2016-01-10 ENCOUNTER — Encounter (HOSPITAL_COMMUNITY)
Admission: RE | Disposition: A | Discharge status: Home / Self Care | Payer: Self-pay | Source: Ambulatory Visit | Attending: Gastroenterology

## 2016-01-10 ENCOUNTER — Encounter (INDEPENDENT_AMBULATORY_CARE_PROVIDER_SITE_OTHER): Payer: Self-pay | Admitting: Internal Medicine

## 2016-01-10 ENCOUNTER — Ambulatory Visit (HOSPITAL_COMMUNITY): Payer: 59 | Admitting: Certified Registered Nurse Anesthetist

## 2016-01-10 ENCOUNTER — Ambulatory Visit (HOSPITAL_COMMUNITY)
Admission: RE | Admit: 2016-01-10 | Discharge: 2016-01-10 | Disposition: A | Discharge status: Home / Self Care | Payer: 59 | Source: Ambulatory Visit | Attending: Gastroenterology | Admitting: Gastroenterology

## 2016-01-10 ENCOUNTER — Encounter (HOSPITAL_COMMUNITY): Payer: Self-pay | Admitting: Certified Registered Nurse Anesthetist

## 2016-01-10 DIAGNOSIS — I10 Essential (primary) hypertension: Secondary | ICD-10-CM | POA: Diagnosis not present

## 2016-01-10 DIAGNOSIS — R933 Abnormal findings on diagnostic imaging of other parts of digestive tract: Secondary | ICD-10-CM | POA: Insufficient documentation

## 2016-01-10 DIAGNOSIS — Z9989 Dependence on other enabling machines and devices: Secondary | ICD-10-CM | POA: Diagnosis not present

## 2016-01-10 DIAGNOSIS — R932 Abnormal findings on diagnostic imaging of liver and biliary tract: Secondary | ICD-10-CM

## 2016-01-10 DIAGNOSIS — G4730 Sleep apnea, unspecified: Secondary | ICD-10-CM | POA: Diagnosis not present

## 2016-01-10 DIAGNOSIS — K869 Disease of pancreas, unspecified: Secondary | ICD-10-CM

## 2016-01-10 DIAGNOSIS — K8689 Other specified diseases of pancreas: Secondary | ICD-10-CM | POA: Diagnosis not present

## 2016-01-10 DIAGNOSIS — R109 Unspecified abdominal pain: Secondary | ICD-10-CM | POA: Diagnosis present

## 2016-01-10 HISTORY — PX: UPPER ESOPHAGEAL ENDOSCOPIC ULTRASOUND (EUS): SHX6562

## 2016-01-10 HISTORY — DX: Essential (primary) hypertension: I10

## 2016-01-10 HISTORY — PX: ESOPHAGOGASTRODUODENOSCOPY (EGD) WITH PROPOFOL: SHX5813

## 2016-01-10 SURGERY — ESOPHAGOGASTRODUODENOSCOPY (EGD) WITH PROPOFOL
Anesthesia: Monitor Anesthesia Care

## 2016-01-10 MED ORDER — SCOPOLAMINE 1 MG/3DAYS TD PT72
MEDICATED_PATCH | TRANSDERMAL | Status: DC | PRN
  Administered 2016-01-10: 1 via TRANSDERMAL

## 2016-01-10 MED ORDER — SCOPOLAMINE 1 MG/3DAYS TD PT72
1.0000 | MEDICATED_PATCH | TRANSDERMAL | Status: DC
  Filled 2016-01-10: qty 1

## 2016-01-10 MED ORDER — METOCLOPRAMIDE HCL 5 MG/ML IJ SOLN
INTRAMUSCULAR | Status: AC
  Filled 2016-01-10: qty 2

## 2016-01-10 MED ORDER — SCOPOLAMINE 1 MG/3DAYS TD PT72
MEDICATED_PATCH | TRANSDERMAL | Status: AC
  Filled 2016-01-10: qty 1

## 2016-01-10 MED ORDER — PROPOFOL 10 MG/ML IV BOLUS
INTRAVENOUS | Status: AC
  Filled 2016-01-10: qty 20

## 2016-01-10 MED ORDER — LACTATED RINGERS IV SOLN
INTRAVENOUS | Status: DC
  Administered 2016-01-10: 1000 mL via INTRAVENOUS

## 2016-01-10 MED ORDER — LIDOCAINE HCL (CARDIAC) 20 MG/ML IV SOLN
INTRAVENOUS | Status: AC
  Filled 2016-01-10: qty 5

## 2016-01-10 MED ORDER — DEXAMETHASONE SODIUM PHOSPHATE 10 MG/ML IJ SOLN
INTRAMUSCULAR | Status: AC
  Filled 2016-01-10: qty 1

## 2016-01-10 MED ORDER — FENTANYL CITRATE (PF) 100 MCG/2ML IJ SOLN
INTRAMUSCULAR | Status: DC | PRN
  Administered 2016-01-10 (×2): 25 ug via INTRAVENOUS

## 2016-01-10 MED ORDER — SODIUM CHLORIDE 0.9 % IV SOLN: INTRAVENOUS | Status: DC

## 2016-01-10 MED ORDER — PROPOFOL 500 MG/50ML IV EMUL
INTRAVENOUS | Status: DC | PRN
  Administered 2016-01-10: 50 ug/kg/min via INTRAVENOUS

## 2016-01-10 MED ORDER — ONDANSETRON HCL 4 MG/2ML IJ SOLN
INTRAMUSCULAR | Status: DC | PRN
  Administered 2016-01-10: 4 mg via INTRAVENOUS

## 2016-01-10 MED ORDER — PROPOFOL 10 MG/ML IV BOLUS
INTRAVENOUS | Status: DC | PRN
  Administered 2016-01-10 (×3): 50 mg via INTRAVENOUS

## 2016-01-10 MED ORDER — FENTANYL CITRATE (PF) 100 MCG/2ML IJ SOLN
INTRAMUSCULAR | Status: AC
  Filled 2016-01-10: qty 2

## 2016-01-10 MED ORDER — DEXAMETHASONE SODIUM PHOSPHATE 10 MG/ML IJ SOLN
INTRAMUSCULAR | Status: DC | PRN
  Administered 2016-01-10: 10 mg via INTRAVENOUS

## 2016-01-10 MED ORDER — METOCLOPRAMIDE HCL 5 MG/ML IJ SOLN
INTRAMUSCULAR | Status: DC | PRN
  Administered 2016-01-10: 10 mg via INTRAVENOUS

## 2016-01-10 MED ORDER — ONDANSETRON HCL 4 MG/2ML IJ SOLN
INTRAMUSCULAR | Status: AC
  Filled 2016-01-10: qty 2

## 2016-01-10 MED ORDER — LIDOCAINE HCL (CARDIAC) 20 MG/ML IV SOLN
INTRAVENOUS | Status: DC | PRN
  Administered 2016-01-10: 100 mg via INTRAVENOUS

## 2016-01-10 NOTE — Discharge Instructions (Signed)

## 2016-01-10 NOTE — Anesthesia Postprocedure Evaluation (Signed)
Anesthesia Post Note  Patient: Melissa Blackburn  Procedure(s) Performed: Procedure(s) (LRB): UPPER ENDOSCOPIC ULTRASOUND (EUS) LINEAR (N/A)  Patient location during evaluation: Endoscopy Anesthesia Type: MAC Level of consciousness: awake and alert Pain management: pain level controlled Vital Signs Assessment: post-procedure vital signs reviewed and stable Respiratory status: spontaneous breathing, nonlabored ventilation, respiratory function stable and patient connected to nasal cannula oxygen Cardiovascular status: stable and blood pressure returned to baseline Anesthetic complications: no    Last Vitals:  Filed Vitals:   01/10/16 1120 01/10/16 1125  BP: 107/56   Pulse: 66 66  Temp:    Resp: 14 12    Last Pain:  Filed Vitals:   01/10/16 1128  PainSc: 3                  Montez Hageman

## 2016-01-10 NOTE — H&P (Signed)
HPI: This is a very pleasant 60 yo woman  Chief complaint is right sided chronic abd pains, eventual CTs suggest abnormal pancreas.   No FH of pancreatic disease (except alcoholic father had AP) No significant weight loss Non smoker No history of pancreatic problems   Past Medical History  Diagnosis Date  . Migraine   . DDD (degenerative disc disease)   . Complication of anesthesia   . PONV (postoperative nausea and vomiting)     "severe nausea and vomiting"  . Asthma     as a child  . Pneumonia     hx of  . Sleep apnea     cpap 7  . Anemia     hx of  . Degenerative disc disease   . Needle stick injury with contaminated needle     patient undergoing testing  . Weakness generalized 11/15/2012  . Paresthesias 11/15/2012  . Pain in limb 11/15/2012  . Numbness and tingling 11/15/2012  . Migraine   . Hypertension     Past Surgical History  Procedure Laterality Date  . Abdominal hysterectomy    . Rectocele repair    . Shoulder surgery      x 5  . Knee surgery      x 4  . Knee arthroscopy  04/23/2012    Procedure: ARTHROSCOPY KNEE;  Surgeon: Augustin Schooling, MD;  Location: [place];  Service: Orthopedics;  Laterality: Left;  lateral meniscectomy    Current Facility-Administered Medications  Medication Dose Route Frequency Provider Last Rate Last Dose  . 0.9 %  sodium chloride infusion   Intravenous Continuous Milus Banister, MD      . lactated ringers infusion   Intravenous Continuous Milus Banister, MD        Allergies as of 12/12/2015 - Review Complete 11/27/2015  Allergen Reaction Noted  . Verapamil Anaphylaxis 11/09/2011  . Avelox [moxifloxacin hcl in nacl] Diarrhea 10/02/2012  . Flexeril [cyclobenzaprine hcl] Other (See Comments) 11/09/2011  . Midazolam hcl Nausea And Vomiting 11/09/2011  . Neurontin [gabapentin] Other (See Comments) 11/09/2011  . Pregabalin Other (See Comments) 11/09/2011  . Tape  11/09/2011    Family History  Problem Relation Age of Onset   . Cancer Mother     Breast cancer  . Hypertension Father   . COPD Father   . Coronary artery disease Father   . Cancer Maternal Grandmother     Bone cancer  . Coronary artery disease Maternal Grandfather     Social History   Social History  . Marital Status: Married    Spouse Name: N/A  . Number of Children: 2  . Years of Education: N/A   Occupational History  . Nurse    Social History Main Topics  . Smoking status: Never Smoker   . Smokeless tobacco: Never Used  . Alcohol Use: No  . Drug Use: No  . Sexual Activity: Not on file   Other Topics Concern  . Not on file   Social History Narrative   ** Merged History Encounter **         Physical Exam: BP 140/70 mmHg  Temp(Src) 97.7 F (36.5 C) (Oral)  Resp 14  Ht 5\' 11"  (1.803 m)  Wt 182 lb (82.555 kg)  BMI 25.40 kg/m2  SpO2 100% Constitutional: generally well-appearing Psychiatric: alert and oriented x3 Abdomen: soft, nontender, nondistended, no obvious ascites, no peritoneal signs, normal bowel sounds   Assessment and plan: 60 y.o. female with abnormal pancreas on CT  For Upper EUS evaluation today   Owens Loffler, MD [facility] 01/10/2016, 10:13 AM

## 2016-01-10 NOTE — Op Note (Signed)
[facility] Patient Name: Melissa Blackburn Procedure Date: 01/10/2016 MRN: [id number] Attending MD: Milus Banister , MD Date of Birth: [birth year] CSN: [id number] Age: 60 Admit Type: Outpatient Procedure:                Upper EUS Indications:              abnormal appearing pancreas on CT scan (10/2015 and                            11/2015) Providers:                Milus Banister, MD, Hilma Favors, RN, Ralene Bathe, Technician, Heide Scales, CRNA Referring MD:             Hildred Laser, MD Medicines:                Monitored Anesthesia Care Complications:            No immediate complications. Estimated blood loss:                            None. Estimated Blood Loss:     Estimated blood loss: none. Procedure:                Pre-Anesthesia Assessment:                           - Prior to the procedure, a History and Physical                            was performed, and patient medications and                            allergies were reviewed. The patient's tolerance of                            previous anesthesia was also reviewed. The risks                            and benefits of the procedure and the sedation                            options and risks were discussed with the patient.                            All questions were answered, and informed consent                            was obtained. Prior Anticoagulants: The patient has                            taken no previous anticoagulant or antiplatelet  agents. ASA Grade Assessment: II - A patient with                            mild systemic disease. After reviewing the risks                            and benefits, the patient was deemed in                            satisfactory condition to undergo the procedure.                           After obtaining informed consent, the endoscope was                            passed under direct vision.  Throughout the                            procedure, the patient's blood pressure, pulse, and                            oxygen saturations were monitored continuously. The                            [id number]) scope was introduced through                            the mouth, and advanced to the second part of                            duodenum. The upper EUS was accomplished without                            difficulty. The patient tolerated the procedure                            well. Scope In: Scope Out: Findings:      Endoscopic Finding :      The examined esophagus was endoscopically normal.      The entire examined stomach was endoscopically normal.      The examined duodenum was endoscopically normal.      Endosonographic Finding :      1. Pancreatic parenchyma was atrophic and indistinct throughout head,       neck, body and tail. No discrete masses were noted. No signs of chronic       pancreatitis.      2. Main pancreatic duct was normal      3. Gallbladder was normal      4. CBD was normal, non-dilated      5. Limited views of liver, spleen, portal and splenic vessels were all       normal. Impression:               Pancreas was somewhat atrophic and indistinct but  there were no signs of tumors, masses. Moderate Sedation:      N/A- Per Anesthesia Care Recommendation:           - Patient has a contact number available for                            emergencies. The signs and symptoms of potential                            delayed complications were discussed with the                            patient. Return to normal activities tomorrow.                            Written discharge instructions were provided to the                            patient.                           - Resume previous diet.                           - Continue present medications.                           - No further dedicated pancreatic testing is  needed                            unless new symptoms dictate. Procedure Code(s):        --- Professional ---                           [phone], Esophagogastroduodenoscopy, flexible,                            transoral; with endoscopic ultrasound examination                            limited to the esophagus, stomach or duodenum, and                            adjacent structures Diagnosis Code(s):        --- Professional ---                           K86.9, Disease of pancreas, unspecified                           R93.3, Abnormal findings on diagnostic imaging of                            other parts of digestive tract CPT copyright 2016 [employer]. All rights reserved. The codes documented in this report are preliminary and upon coder review may  be revised to meet current compliance requirements.  Milus Banister, MD 01/10/2016 10:57:58 AM This report has been signed electronically. Number of Addenda: 0

## 2016-01-10 NOTE — Transfer of Care (Signed)
Immediate Anesthesia Transfer of Care Note  Patient: Melissa Blackburn  Procedure(s) Performed: Procedure(s): UPPER ENDOSCOPIC ULTRASOUND (EUS) LINEAR (N/A)  Patient Location: PACU  Anesthesia Type:MAC  Level of Consciousness: Patient easily awoken, sedated, comfortable, cooperative, following commands, responds to stimulation.   Airway & Oxygen Therapy: Patient spontaneously breathing, ventilating well, oxygen via simple oxygen mask.  Post-op Assessment: Report given to PACU RN, vital signs reviewed and stable, moving all extremities.   Post vital signs: Reviewed and stable.  Complications: No apparent anesthesia complications

## 2016-01-10 NOTE — Anesthesia Preprocedure Evaluation (Signed)
Anesthesia Evaluation  Patient identified by MRN, date of birth, ID band Patient awake    Reviewed: Allergy & Precautions, NPO status , Patient's Chart, lab work & pertinent test results  History of Anesthesia Complications (+) PONV  Airway Mallampati: II  TM Distance: >3 FB Neck ROM: Full    Dental no notable dental hx.    Pulmonary asthma , sleep apnea and Continuous Positive Airway Pressure Ventilation ,    Pulmonary exam normal breath sounds clear to auscultation       Cardiovascular hypertension, Pt. on medications Normal cardiovascular exam Rhythm:Regular Rate:Normal     Neuro/Psych negative neurological ROS  negative psych ROS   GI/Hepatic negative GI ROS, Neg liver ROS,   Endo/Other  negative endocrine ROS  Renal/GU negative Renal ROS  negative genitourinary   Musculoskeletal negative musculoskeletal ROS (+)   Abdominal   Peds negative pediatric ROS (+)  Hematology negative hematology ROS (+)   Anesthesia Other Findings   Reproductive/Obstetrics negative OB ROS                             Anesthesia Physical Anesthesia Plan  ASA: II  Anesthesia Plan: MAC   Post-op Pain Management:    Induction:   Airway Management Planned:   Additional Equipment:   Intra-op Plan:   Post-operative Plan:   Informed Consent: I have reviewed the patients History and Physical, chart, labs and discussed the procedure including the risks, benefits and alternatives for the proposed anesthesia with the patient or authorized representative who has indicated his/her understanding and acceptance.   Dental advisory given  Plan Discussed with: CRNA  Anesthesia Plan Comments:         Anesthesia Quick Evaluation

## 2016-01-11 ENCOUNTER — Encounter (HOSPITAL_COMMUNITY): Payer: Self-pay | Admitting: Gastroenterology

## 2016-01-15 ENCOUNTER — Encounter (INDEPENDENT_AMBULATORY_CARE_PROVIDER_SITE_OTHER): Payer: Self-pay | Admitting: *Deleted

## 2016-01-15 ENCOUNTER — Telehealth (INDEPENDENT_AMBULATORY_CARE_PROVIDER_SITE_OTHER): Payer: Self-pay | Admitting: Internal Medicine

## 2016-01-15 ENCOUNTER — Other Ambulatory Visit (INDEPENDENT_AMBULATORY_CARE_PROVIDER_SITE_OTHER): Payer: Self-pay | Admitting: Internal Medicine

## 2016-01-15 ENCOUNTER — Encounter (INDEPENDENT_AMBULATORY_CARE_PROVIDER_SITE_OTHER): Payer: Self-pay | Admitting: Internal Medicine

## 2016-01-15 DIAGNOSIS — R1011 Right upper quadrant pain: Secondary | ICD-10-CM

## 2016-01-15 MED ORDER — DICYCLOMINE HCL 10 MG PO CAPS: 10.0000 mg | ORAL_CAPSULE | Freq: Three times a day (TID) | ORAL | Status: DC

## 2016-01-15 NOTE — Telephone Encounter (Signed)
[place] sch'd 01/18/16 at 1030 (1015), npo after midnight, sent patient a MyChart message with appt info

## 2016-01-15 NOTE — Telephone Encounter (Signed)
EUS results reviewed with patient. She continues to complain of postprandial right upper quadrant abdominal pain. Will proceed with upper abdominal ultrasound and she will try dicyclomine 10 mg before each meal. Will send prescription to her pharmacy.

## 2016-01-18 ENCOUNTER — Ambulatory Visit (HOSPITAL_COMMUNITY)
Admission: RE | Admit: 2016-01-18 | Discharge: 2016-01-18 | Disposition: A | Discharge status: Home / Self Care | Payer: 59 | Source: Ambulatory Visit | Attending: Internal Medicine | Admitting: Internal Medicine

## 2016-01-18 DIAGNOSIS — R1011 Right upper quadrant pain: Secondary | ICD-10-CM | POA: Insufficient documentation

## 2016-01-29 MED FILL — AMITRIPTYLINE HCL 50 MG TAB: 50 | 30 days supply | Qty: 60 | Fill #1

## 2016-01-29 MED FILL — ATENOLOL 25 MG TABLET: 25 | 30 days supply | Qty: 30 | Fill #1

## 2016-01-29 MED FILL — BACLOFEN 10 MG TABLET: 10 | 30 days supply | Qty: 90 | Fill #3

## 2016-02-11 DIAGNOSIS — S6990XA Unspecified injury of unspecified wrist, hand and finger(s), initial encounter: Secondary | ICD-10-CM | POA: Diagnosis not present

## 2016-02-11 DIAGNOSIS — Z23 Encounter for immunization: Secondary | ICD-10-CM | POA: Diagnosis not present

## 2016-02-25 MED FILL — AMITRIPTYLINE HCL 50 MG TAB: 50 | 30 days supply | Qty: 60 | Fill #2

## 2016-02-25 MED FILL — ATENOLOL 25 MG TABLET: 25 | 30 days supply | Qty: 30 | Fill #2

## 2016-03-03 DIAGNOSIS — G43719 Chronic migraine without aura, intractable, without status migrainosus: Secondary | ICD-10-CM | POA: Diagnosis not present

## 2016-03-19 DIAGNOSIS — G43719 Chronic migraine without aura, intractable, without status migrainosus: Secondary | ICD-10-CM | POA: Diagnosis not present

## 2016-03-31 MED FILL — BACLOFEN 10 MG TABLET: 10 | 30 days supply | Qty: 90 | Fill #4

## 2016-03-31 MED FILL — ATENOLOL 25 MG TABLET: 25 | 90 days supply | Qty: 90 | Fill #3

## 2016-04-16 ENCOUNTER — Ambulatory Visit: Payer: Self-pay

## 2016-04-16 ENCOUNTER — Other Ambulatory Visit: Payer: Self-pay | Admitting: Occupational Medicine

## 2016-04-16 DIAGNOSIS — M545 Low back pain: Secondary | ICD-10-CM

## 2016-04-16 MED FILL — AMITRIPTYLINE HCL 50 MG TAB: 50 | 30 days supply | Qty: 60 | Fill #3

## 2016-04-30 ENCOUNTER — Ambulatory Visit (HOSPITAL_COMMUNITY): Payer: PRIVATE HEALTH INSURANCE | Attending: Family Medicine | Admitting: Physical Therapy

## 2016-04-30 DIAGNOSIS — M5440 Lumbago with sciatica, unspecified side: Secondary | ICD-10-CM | POA: Diagnosis not present

## 2016-04-30 DIAGNOSIS — R29898 Other symptoms and signs involving the musculoskeletal system: Secondary | ICD-10-CM | POA: Diagnosis present

## 2016-04-30 DIAGNOSIS — R293 Abnormal posture: Secondary | ICD-10-CM | POA: Insufficient documentation

## 2016-04-30 DIAGNOSIS — M6281 Muscle weakness (generalized): Secondary | ICD-10-CM | POA: Diagnosis present
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
## 2016-04-30 NOTE — Patient Instructions (Signed)
   Lumbar Rotations   Lying on your back with your knees bent, slowly drop your legs to one side and hold the stretch. Come back to the middle and switch sides. You should feel the stretch in your back on the opposite side that your legs are leaning.   You only need to hold this for a second; when letting your legs drop off to the right, only go far enough that you have feelings of tightness/stretching, not sharp/burning/shooting pains.   Repeat 5 times each side, twice a day.    BRIDGING  While lying on your back, tighten your lower abdominals, squeeze your buttocks and then raise your buttocks off the floor/bed as creating a "Bridge" with your body. Hold and then lower yourself and repeat 10 times, 1-2 times per day.    Pelvic Clock  Do a pelvic clock. Practice moving pelvis so that your back arches (as you move towards 6:00) and then flatten your back to the ground (as you move towards 12:00).   Repeat 10 times each way, 1-2 times per day.

## 2016-05-01 ENCOUNTER — Other Ambulatory Visit (HOSPITAL_COMMUNITY): Payer: Self-pay | Admitting: Family Medicine

## 2016-05-01 DIAGNOSIS — K6289 Other specified diseases of anus and rectum: Secondary | ICD-10-CM

## 2016-05-01 DIAGNOSIS — M545 Low back pain: Secondary | ICD-10-CM

## 2016-05-02 ENCOUNTER — Other Ambulatory Visit (HOSPITAL_COMMUNITY): Payer: Self-pay | Admitting: Family Medicine

## 2016-05-02 DIAGNOSIS — G9050 Complex regional pain syndrome I, unspecified: Secondary | ICD-10-CM | POA: Diagnosis not present

## 2016-05-02 DIAGNOSIS — T148XXA Other injury of unspecified body region, initial encounter: Secondary | ICD-10-CM

## 2016-05-06 ENCOUNTER — Ambulatory Visit (HOSPITAL_COMMUNITY): Payer: PRIVATE HEALTH INSURANCE | Attending: Family Medicine

## 2016-05-06 DIAGNOSIS — M5440 Lumbago with sciatica, unspecified side: Secondary | ICD-10-CM | POA: Diagnosis not present

## 2016-05-06 DIAGNOSIS — R293 Abnormal posture: Secondary | ICD-10-CM | POA: Diagnosis not present

## 2016-05-06 DIAGNOSIS — R29898 Other symptoms and signs involving the musculoskeletal system: Secondary | ICD-10-CM | POA: Diagnosis not present

## 2016-05-06 DIAGNOSIS — M6281 Muscle weakness (generalized): Secondary | ICD-10-CM | POA: Diagnosis not present

## 2016-05-06 NOTE — Therapy (Signed)
[place], [place], [address] Phone: [phone]   Fax:  [phone]  Physical Therapy Treatment  Patient Details  Name: Melissa Blackburn MRN: [id number] Date of Birth: [birth year] Referring Provider: Odis Luster   Encounter Date: 05/06/2016      PT End of Session - 05/06/16 1403    Visit Number 2   Number of Visits 6   Date for PT Re-Evaluation 05/21/16   Authorization Type Worker's Comp (as of 04/30/16, patient only has 6 sessions)   Authorization Time Period 04/30/16 to 05/21/16   Authorization - Visit Number 2   Authorization - Number of Visits 6   PT Start Time 1350   PT Stop Time 1428   PT Time Calculation (min) 38 min   Activity Tolerance Patient tolerated treatment well;Patient limited by pain  Increased pain scale to 5/10 following therex   Behavior During Therapy [facility] for tasks assessed/performed      Past Medical History:  Diagnosis Date  . Anemia    hx of  . Asthma    as a child  . Complication of anesthesia   . DDD (degenerative disc disease)   . Degenerative disc disease   . Hypertension   . Migraine   . Migraine   . Needle stick injury with contaminated needle    patient undergoing testing  . Numbness and tingling 11/15/2012  . Pain in limb 11/15/2012  . Paresthesias 11/15/2012  . Pneumonia    hx of  . PONV (postoperative nausea and vomiting)    "severe nausea and vomiting"  . Sleep apnea    cpap 7  . Weakness generalized 11/15/2012    Past Surgical History:  Procedure Laterality Date  . ABDOMINAL HYSTERECTOMY    . ESOPHAGOGASTRODUODENOSCOPY (EGD) WITH PROPOFOL N/A 01/10/2016   Procedure: ESOPHAGOGASTRODUODENOSCOPY (EGD) WITH PROPOFOL;  Surgeon: Milus Banister, MD;  Location: [facility];  Service: Endoscopy;  Laterality: N/A;  . KNEE ARTHROSCOPY  04/23/2012   Procedure: ARTHROSCOPY KNEE;  Surgeon: Augustin Schooling, MD;  Location: [facility];  Service: Orthopedics;  Laterality: Left;   lateral meniscectomy  . KNEE SURGERY     x 4  . RECTOCELE REPAIR    . SHOULDER SURGERY     x 5  . UPPER ESOPHAGEAL ENDOSCOPIC ULTRASOUND (EUS)  01/10/2016   Procedure: UPPER ESOPHAGEAL ENDOSCOPIC ULTRASOUND (EUS);  Surgeon: Milus Banister, MD;  Location: [facility];  Service: Endoscopy;;    There were no vitals filed for this visit.      Subjective Assessment - 05/06/16 1354    Subjective Pt reports she has been compliant iwth new HEP and reports no real reduction of symptoms.  Reports pain scale 3/10 lower back and radicular symptoms down Bil LE to toes.  Pt entered dept stated she has upper respistory infection over weekend.   Pertinent History DDD   Patient Stated Goals get rid of pain, get back to outdoor activities like hunting, get back to PLOF    Currently in Pain? Yes   Pain Score 3    Pain Location Back  rectal area with radicular symptoms down Bil LE to toes   Pain Descriptors / Indicators Sore;Stabbing;Burning  sore LB, stabbing burning pain   Pain Type Acute pain   Pain Radiating Towards down posterior LE   Pain Onset 1 to 4 weeks ago   Pain Frequency Constant   Aggravating Factors  sitting too long, standing too long, bending over, trying  to layu on side when sleeps   Pain Relieving Factors flat on her back with LE Elevated   Effect of Pain on Daily Activities interfering with job duties and [place] Adult PT Treatment/Exercise - 05/06/16 0001      Lumbar Exercises: Stretches   Active Hamstring Stretch 1 rep   Active Hamstring Stretch Limitations supine with rope; reports of increased numbness to toes; stopped folowing 1st rep   Lower Trunk Rotation 5 reps;10 seconds   Piriformis Stretch 2 reps;30 seconds   Piriformis Stretch Limitations figure 4 supine with towel     Lumbar Exercises: Seated   Hip Flexion on Ball Limitations Postural education; scapular and cervical retraction 10x5"     Lumbar Exercises: Supine   Bridge 10 reps   Other Supine  Lumbar Exercises pelvic clock 12 and 6             PT Short Term Goals - 04/30/16 0909      PT SHORT TERM GOAL #1   Title Patient to experience pain no more than 2/10 during all functional tasks and activities in order to improve QOL and functional task performance    Time 10   Period Days   Status New     PT SHORT TERM GOAL #2   Title Patient to be able to maintain correct posture at least 75% of the time in order to assist in reducing pain and improving functional task performance    Time 10   Period Days   Status New     PT SHORT TERM GOAL #3   Title Paitent to demonstrate full ROM in all directions of her lumbar spine with no increase in pain to show general improvement of condition    Time 10   Period Days   Status New     PT SHORT TERM GOAL #4   Title Patient to be independent in correctly and consistently performing appropriate HEP, to be updated PRN    Time 10   Period Days   Status New           PT Long Term Goals - 04/30/16 0911      PT LONG TERM GOAL #1   Title Patient to demonstrate functional strength 5/5 in all tested muscle groups in order to assist in reducing pain and facilitating return to PLOF    Time 3   Period Weeks   Status New     PT LONG TERM GOAL #2   Title Patient to demonstrate correct functional lifting mechanics in order to assist in preventing exacerbation of pain/reinjury of lumbar area    Time 3   Period Weeks   Status New     PT LONG TERM GOAL #3   Title Patient to show normalized gait mechanics, including equal pelvic mobility, reduced rigidity of lumbar and thoracic spines, and pain with extended gait no more than 2/10 in order to improved tolerance to extended weight bearing activities    Time 3   Period Weeks   Status New     PT LONG TERM GOAL #4   Title Patient to report she has been able to return to her normal work based and recreational activities with pain no more than 2/10 in order to improve QOL    Time 3    Period Weeks   Status New               Plan -  05/06/16 1446    Clinical Impression Statement Reviewed goals, compliance with HEP and copy of eval given to pt.  Began session reviewing form and technique with HEP, pt able to demonstrate appropriate form and technqiue with minimal cueing required for form.  Pt c/o feeling stiff and tight, added lumbar mobility exercises and LE stretches.  Pt c/o increased numbness during supine hamstrings stretch, terminiated stretch due to increased symptoms.  Pt educated on importance of proper posture and began posture strengthenuing exercises.  EOS pt reports increased pain scale 5/10 with radicular symtoms.     Rehab Potential Good   PT Frequency 2x / week   PT Duration 3 weeks   PT Treatment/Interventions ADLs/Self Care Home Management;Biofeedback;Cryotherapy;Moist Heat;Gait training;Stair training;Functional mobility training;Therapeutic activities;Therapeutic exercise;Balance training;Neuromuscular re-education;Patient/family education;Manual techniques;Passive range of motion;Energy conservation;Taping   PT Next Visit Plan Next session trial with extension based exercises, manual traction, nerve glossing; postural training, cautious lumbar/pelvic mobility, proximal strengthening. Functional lifting when ready/pain reduces    PT Home Exercise Plan 9/20: bridges, cautious lumbar rotations, 12-6 pelvic clocks       Patient will benefit from skilled therapeutic intervention in order to improve the following deficits and impairments:  Abnormal gait, Impaired sensation, Improper body mechanics, Pain, Decreased mobility, Postural dysfunction, Decreased activity tolerance, Decreased range of motion, Hypomobility, Difficulty walking  Visit Diagnosis: Midline low back pain with sciatica, sciatica laterality unspecified  Muscle weakness (generalized)  Abnormal posture  Other symptoms and signs involving the musculoskeletal system     Problem  List Patient Active Problem List   Diagnosis Date Noted  . Weakness generalized 11/15/2012  . Paresthesias 11/15/2012  . Pain in limb 11/15/2012  . Numbness and tingling 11/15/2012   Ihor Austin, LPTA; CBIS [phone]  Aldona Lento 05/06/2016, 5:04 PM  [place] [address] [place], [place], [address] Phone: [phone]   Fax:  [phone]  Name: Melissa Blackburn MRN: [id number] Date of Birth: [birth year]

## 2016-05-07 ENCOUNTER — Ambulatory Visit (HOSPITAL_COMMUNITY): Payer: PRIVATE HEALTH INSURANCE

## 2016-05-07 ENCOUNTER — Ambulatory Visit (HOSPITAL_COMMUNITY)
Admission: RE | Admit: 2016-05-07 | Discharge: 2016-05-07 | Disposition: A | Discharge status: Home / Self Care | Payer: PRIVATE HEALTH INSURANCE | Source: Ambulatory Visit | Attending: Family Medicine | Admitting: Family Medicine

## 2016-05-07 DIAGNOSIS — T148 Other injury of unspecified body region: Secondary | ICD-10-CM | POA: Diagnosis not present

## 2016-05-07 DIAGNOSIS — M4806 Spinal stenosis, lumbar region: Secondary | ICD-10-CM | POA: Insufficient documentation

## 2016-05-07 DIAGNOSIS — X58XXXA Exposure to other specified factors, initial encounter: Secondary | ICD-10-CM | POA: Diagnosis not present

## 2016-05-07 DIAGNOSIS — M47896 Other spondylosis, lumbar region: Secondary | ICD-10-CM | POA: Insufficient documentation

## 2016-05-07 DIAGNOSIS — T148XXA Other injury of unspecified body region, initial encounter: Secondary | ICD-10-CM

## 2016-05-13 ENCOUNTER — Ambulatory Visit (HOSPITAL_COMMUNITY): Payer: PRIVATE HEALTH INSURANCE

## 2016-05-13 ENCOUNTER — Telehealth (HOSPITAL_COMMUNITY): Payer: Self-pay

## 2016-05-13 NOTE — Telephone Encounter (Signed)
10/3 pt called and said that she couldn't come in for her appt today... She is working but will be here on 10/4

## 2016-05-14 ENCOUNTER — Ambulatory Visit (HOSPITAL_COMMUNITY): Payer: PRIVATE HEALTH INSURANCE | Attending: Family Medicine

## 2016-05-14 DIAGNOSIS — R293 Abnormal posture: Secondary | ICD-10-CM | POA: Insufficient documentation

## 2016-05-14 DIAGNOSIS — M6281 Muscle weakness (generalized): Secondary | ICD-10-CM | POA: Diagnosis present

## 2016-05-14 DIAGNOSIS — R29898 Other symptoms and signs involving the musculoskeletal system: Secondary | ICD-10-CM | POA: Insufficient documentation

## 2016-05-14 NOTE — Therapy (Signed)
[place], [place], [address] Phone: [phone]   Fax:  [phone]  Physical Therapy Treatment  Patient Details  Name: Melissa Blackburn MRN: [id number] Date of Birth: 15-Oct-1955 Referring Provider: Odis Luster   Encounter Date: 05/14/2016      PT End of Session - 05/14/16 1307    Visit Number 3   Number of Visits 6   Date for PT Re-Evaluation 05/21/16   Authorization Type Worker's Comp (as of 04/30/16, patient only has 6 sessions)   Authorization Time Period 04/30/16 to 05/21/16   Authorization - Visit Number 3   Authorization - Number of Visits 6   PT Start Time 1300   PT Stop Time [id number]   PT Time Calculation (min) 45 min   Activity Tolerance Patient tolerated treatment well;No increased pain   Behavior During Therapy WFL for tasks assessed/performed      Past Medical History:  Diagnosis Date  . Anemia    hx of  . Asthma    as a child  . Complication of anesthesia   . DDD (degenerative disc disease)   . Degenerative disc disease   . Hypertension   . Migraine   . Migraine   . Needle stick injury with contaminated needle    patient undergoing testing  . Numbness and tingling 11/15/2012  . Pain in limb 11/15/2012  . Paresthesias 11/15/2012  . Pneumonia    hx of  . PONV (postoperative nausea and vomiting)    "severe nausea and vomiting"  . Sleep apnea    cpap 7  . Weakness generalized 11/15/2012    Past Surgical History:  Procedure Laterality Date  . ABDOMINAL HYSTERECTOMY    . ESOPHAGOGASTRODUODENOSCOPY (EGD) WITH PROPOFOL N/A 01/10/2016   Procedure: ESOPHAGOGASTRODUODENOSCOPY (EGD) WITH PROPOFOL;  Surgeon: Milus Banister, MD;  Location: [facility];  Service: Endoscopy;  Laterality: N/A;  . KNEE ARTHROSCOPY  04/23/2012   Procedure: ARTHROSCOPY KNEE;  Surgeon: Augustin Schooling, MD;  Location: [facility];  Service: Orthopedics;  Laterality: Left;  lateral meniscectomy  . KNEE SURGERY     x 4  .  RECTOCELE REPAIR    . SHOULDER SURGERY     x 5  . UPPER ESOPHAGEAL ENDOSCOPIC ULTRASOUND (EUS)  01/10/2016   Procedure: UPPER ESOPHAGEAL ENDOSCOPIC ULTRASOUND (EUS);  Surgeon: Milus Banister, MD;  Location: [facility];  Service: Endoscopy;;    There were no vitals filed for this visit.      Subjective Assessment - 05/14/16 1304    Subjective Pt stated she continues to have radicular symptoms down Bil LE to toes and rectal pain scale 3/10 feels like increased pressure.   Pertinent History DDD   Patient Stated Goals get rid of pain, get back to outdoor activities like hunting, get back to PLOF    Currently in Pain? Yes   Pain Score 3    Pain Location Back   Pain Orientation Mid;Lower   Pain Descriptors / Indicators Stabbing;Burning  Pressure   Pain Type Acute pain   Pain Radiating Towards down posterior LE to toes   Pain Onset 1 to 4 weeks ago   Pain Frequency Constant   Aggravating Factors  sitting too long, standing too long, bending over, trying to lay on side when sleeping   Pain Relieving Factors flat on her back with LE elevated   Effect of Pain on Daily Activities interfering with job duties and PLOF  [place] Adult PT Treatment/Exercise - 05/14/16 0001      Lumbar Exercises: Stretches   Single Knee to Chest Stretch 2 reps;30 seconds   Lower Trunk Rotation 5 reps;10 seconds   Prone on Elbows Stretch 1 rep  2 min   Press Ups 1 rep;10 seconds     Lumbar Exercises: Supine   Clam 10 reps   Clam Limitations red TB    Bridge 10 reps   Bridge Limitations reports increased rectal pain with full extension   Other Supine Lumbar Exercises decompression with RTB     Manual Therapy   Manual Therapy Neural Stretch   Manual therapy comments Manual complete separate rest of tx   Neural Stretch Supine nerve glossing                  PT Short Term Goals - 04/30/16 0909      PT SHORT TERM GOAL #1   Title Patient to experience  pain no more than 2/10 during all functional tasks and activities in order to improve QOL and functional task performance    Time 10   Period Days   Status New     PT SHORT TERM GOAL #2   Title Patient to be able to maintain correct posture at least 75% of the time in order to assist in reducing pain and improving functional task performance    Time 10   Period Days   Status New     PT SHORT TERM GOAL #3   Title Paitent to demonstrate full ROM in all directions of her lumbar spine with no increase in pain to show general improvement of condition    Time 10   Period Days   Status New     PT SHORT TERM GOAL #4   Title Patient to be independent in correctly and consistently performing appropriate HEP, to be updated PRN    Time 10   Period Days   Status New           PT Long Term Goals - 04/30/16 0911      PT LONG TERM GOAL #1   Title Patient to demonstrate functional strength 5/5 in all tested muscle groups in order to assist in reducing pain and facilitating return to PLOF    Time 3   Period Weeks   Status New     PT LONG TERM GOAL #2   Title Patient to demonstrate correct functional lifting mechanics in order to assist in preventing exacerbation of pain/reinjury of lumbar area    Time 3   Period Weeks   Status New     PT LONG TERM GOAL #3   Title Patient to show normalized gait mechanics, including equal pelvic mobility, reduced rigidity of lumbar and thoracic spines, and pain with extended gait no more than 2/10 in order to improved tolerance to extended weight bearing activities    Time 3   Period Weeks   Status New     PT LONG TERM GOAL #4   Title Patient to report she has been able to return to her normal work based and recreational activities with pain no more than 2/10 in order to improve QOL    Time 3   Period Weeks   Status New               Plan - 05/14/16 1326    Clinical Impression Statement Session focus on trial with extension based  exercises and proximal strengthening.  Pt  able to complete all therex with correct form with very minimal cueing.  No reports of decreased nor increased radicular symptoms with any exercise or extension based exercise.  Positive results with reports of decreased symptoms following SKTC and manual nerve glossing to sciatic in supine.    Rehab Potential Good   PT Frequency 2x / week   PT Duration 3 weeks   PT Treatment/Interventions ADLs/Self Care Home Management;Biofeedback;Cryotherapy;Moist Heat;Gait training;Stair training;Functional mobility training;Therapeutic activities;Therapeutic exercise;Balance training;Neuromuscular re-education;Patient/family education;Manual techniques;Passive range of motion;Energy conservation;Taping   PT Next Visit Plan Next session continue nerve glossing; postural training, cautious lumbar/pelvic mobility, proximal strengthening. Functional lifting when ready/pain reduces    PT Home Exercise Plan 9/20: bridges, cautious lumbar rotations, 12-6 pelvic clocks       Patient will benefit from skilled therapeutic intervention in order to improve the following deficits and impairments:  Abnormal gait, Impaired sensation, Improper body mechanics, Pain, Decreased mobility, Postural dysfunction, Decreased activity tolerance, Decreased range of motion, Hypomobility, Difficulty walking  Visit Diagnosis: Muscle weakness (generalized)  Abnormal posture  Other symptoms and signs involving the musculoskeletal system     Problem List Patient Active Problem List   Diagnosis Date Noted  . Weakness generalized 11/15/2012  . Paresthesias 11/15/2012  . Pain in limb 11/15/2012  . Numbness and tingling 11/15/2012   Ihor Austin, LPTA; CBIS [phone]  Aldona Lento 05/14/2016, 2:27 PM  [place] [facility] [address] [place], [place], [address] Phone: [phone]   Fax:  [phone]  Name: Melissa Blackburn MRN:  [id number] Date of Birth: [birth year]

## 2016-05-16 ENCOUNTER — Encounter (HOSPITAL_COMMUNITY): Payer: Self-pay

## 2016-05-19 ENCOUNTER — Ambulatory Visit (HOSPITAL_COMMUNITY): Payer: PRIVATE HEALTH INSURANCE | Admitting: Physical Therapy

## 2016-05-19 DIAGNOSIS — R293 Abnormal posture: Secondary | ICD-10-CM

## 2016-05-19 DIAGNOSIS — M6281 Muscle weakness (generalized): Secondary | ICD-10-CM | POA: Diagnosis not present

## 2016-05-19 DIAGNOSIS — R29898 Other symptoms and signs involving the musculoskeletal system: Secondary | ICD-10-CM

## 2016-05-19 NOTE — Patient Instructions (Signed)
   DOUBLE KNEE TO CHEST STRETCH - DKTC  While Lying on your back,  hold your knees and gently pull them up towards your chest.  Hold for 10 seconds, repeat 3-5 times, twice a day.    Sciatic Nerve Glide w/ Rope  While lying on your back (supine), lift the affected limb to the ceiling with knee straight and foot back (dorsiflexion), using a sturdy rope / belt / dog leash. Oscillate your foot from plantarflexion to dorsiflexion 1-2 second hold each direction to glide your sciatic nerve. Keep the back, hip, and knee locked out with the rope as possible. The opposite limb should be straight and flat on the mat.   Repeat 20 times each leg, twice a day.

## 2016-05-19 NOTE — Therapy (Signed)
[place], [place], [address] Phone: [phone]   Fax:  [phone]  Physical Therapy Treatment (Re-Assessment)  Patient Details  Name: Melissa Blackburn MRN: [id number] Date of Birth: 07/30/1956 Referring Provider: Carlota Raspberry  Encounter Date: 05/19/2016      PT End of Session - 05/19/16 1341    Visit Number 4   Number of Visits 12   Date for PT Re-Evaluation 06/16/16   Authorization Type Worker's Comp (as of 04/30/16, patient only has 6 sessions; as of 10/9, 6 more additional sessions approved)   Authorization Time Period 04/30/16 to 123XX123; recert done [id number]   Authorization - Visit Number 4   Authorization - Number of Visits 12   PT Start Time 1300   PT Stop Time 1340   PT Time Calculation (min) 40 min   Activity Tolerance Patient tolerated treatment well;No increased pain   Behavior During Therapy WFL for tasks assessed/performed      Past Medical History:  Diagnosis Date  . Anemia    hx of  . Asthma    as a child  . Complication of anesthesia   . DDD (degenerative disc disease)   . Degenerative disc disease   . Hypertension   . Migraine   . Migraine   . Needle stick injury with contaminated needle    patient undergoing testing  . Numbness and tingling 11/15/2012  . Pain in limb 11/15/2012  . Paresthesias 11/15/2012  . Pneumonia    hx of  . PONV (postoperative nausea and vomiting)    "severe nausea and vomiting"  . Sleep apnea    cpap 7  . Weakness generalized 11/15/2012    Past Surgical History:  Procedure Laterality Date  . ABDOMINAL HYSTERECTOMY    . ESOPHAGOGASTRODUODENOSCOPY (EGD) WITH PROPOFOL N/A 01/10/2016   Procedure: ESOPHAGOGASTRODUODENOSCOPY (EGD) WITH PROPOFOL;  Surgeon: Milus Banister, MD;  Location: [facility];  Service: Endoscopy;  Laterality: N/A;  . KNEE ARTHROSCOPY  04/23/2012   Procedure: ARTHROSCOPY KNEE;  Surgeon: Augustin Schooling, MD;  Location: [facility];  Service:  Orthopedics;  Laterality: Left;  lateral meniscectomy  . KNEE SURGERY     x 4  . RECTOCELE REPAIR    . SHOULDER SURGERY     x 5  . UPPER ESOPHAGEAL ENDOSCOPIC ULTRASOUND (EUS)  01/10/2016   Procedure: UPPER ESOPHAGEAL ENDOSCOPIC ULTRASOUND (EUS);  Surgeon: Milus Banister, MD;  Location: [facility];  Service: Endoscopy;;    There were no vitals filed for this visit.      Subjective Assessment - 05/19/16 1302    Subjective Patient arrives stating that she felt pretty good after last session up until that night; the next day she could barely move around. Overall, patient does feel that she is progressing a little bit, things have gotten slightly better- the pain down her leg is not as constant or intense, she can move a little bit better as well. Bending over is still the hardest thing for her to do.    Pertinent History DDD   How long can you sit comfortably? 10/9- 45-60 minutes    How long can you stand comfortably? 10/9- 45-60 minutes, rectal pain has improved    How long can you walk comfortably? 10/9- better, can do a full shift at work but really feels the pain later on    Patient Stated Goals get rid of pain, get back to outdoor activities like hunting, get back to [facility]  Currently in Pain? Yes   Pain Score 2    Pain Location Back   Pain Orientation Mid;Lower   Pain Descriptors / Indicators Sore   Pain Type Acute pain   Pain Radiating Towards down posterior LE to toes    Pain Onset 1 to 4 weeks ago   Pain Frequency Constant   Aggravating Factors  bending over, twisting    Pain Relieving Factors laying with LEs elevated    Effect of Pain on Daily Activities interfering with job duties/PLOF             [facility] PT Assessment - 05/19/16 0001      Assessment   Medical Diagnosis lumbar spine sprain    Referring Provider Carlota Raspberry   Onset Date/Surgical Date 04/10/16   Next MD Visit did not ask      Precautions   Precaution Comments cannot lift more than 10#, don't  stand for long periods of time, limit bending      Balance Screen   Has the patient fallen in the past 6 months No   Has the patient had a decrease in activity level because of a fear of falling?  No   Is the patient reluctant to leave their home because of a fear of falling?  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   Leisure hunting, outdoor activities      AROM   Lumbar Flexion able to touch toes, has to use hands to get back up    Lumbar Extension approximately 30-40% limited    Lumbar - Right Side Bend fingertips to midilne of knee joint    Lumbar - Left Side Bend fingertips just above midline of knee jioint      Strength   Right Hip Flexion 5/5   Right Hip Extension 4+/5   Right Hip ABduction 5/5   Left Hip Flexion 5/5   Left Hip Extension 3+/5   Left Hip ABduction 3/5   Right Knee Flexion 5/5   Right Knee Extension 5/5   Left Knee Flexion 5/5   Left Knee Extension 5/5   Right Ankle Dorsiflexion 5/5   Left Ankle Dorsiflexion 5/5     Ambulation/Gait   Gait Comments proximal muscle weakness, improved gait pattern in general                      [facility] Adult PT Treatment/Exercise - 05/19/16 0001      Lumbar Exercises: Stretches   Single Knee to Chest Stretch 5 reps;10 seconds   Double Knee to Chest Stretch 3 reps;10 seconds   Lower Trunk Rotation 5 reps;10 seconds     Lumbar Exercises: Quadruped   Madcat/Old Horse 10 reps   Madcat/Old Horse Limitations only into flexion, not past neutral      Manual Therapy   Manual Therapy Neural Stretch   Manual therapy comments Manual complete separate rest of tx   Neural Stretch Supine nerve glossing                PT Education - 05/19/16 1341    Education provided Yes   Education Details progress thus far, POC moving forward, flexion directional preference and application to exercise in PT, HEP updates    Person(s) Educated  Patient   Methods Explanation;Handout   Comprehension Verbalized understanding;Returned demonstration;Need further instruction          PT  Short Term Goals - 05/19/16 1312      PT SHORT TERM GOAL #1   Title Patient to experience pain no more than 2/10 during all functional tasks and activities in order to improve QOL and functional task performance    Baseline 10/9- at work can get to 5/10, 6/10 is a highest    Time 10   Period Days   Status On-going     PT SHORT TERM GOAL #2   Title Patient to be able to maintain correct posture at least 75% of the time in order to assist in reducing pain and improving functional task performance    Time 10   Period Days   Status Achieved     PT SHORT TERM GOAL #3   Title Paitent to demonstrate full ROM in all directions of her lumbar spine with no increase in pain to show general improvement of condition    Baseline 10/9- improving    Time 10   Period Days   Status On-going     PT SHORT TERM GOAL #4   Title Patient to be independent in correctly and consistently performing appropriate HEP, to be updated PRN    Baseline 10/9- bridges, pelvic tiles, supine marches, pOE but has peripheralization    Time 10   Period Days   Status Achieved           PT Long Term Goals - 05/19/16 1314      PT LONG TERM GOAL #1   Title Patient to demonstrate functional strength 5/5 in all tested muscle groups in order to assist in reducing pain and facilitating return to PLOF    Baseline 10/9- improving except for proximal strength L    Time 3   Period Weeks   Status On-going     PT LONG TERM GOAL #2   Title Patient to demonstrate correct functional lifting mechanics in order to assist in preventing exacerbation of pain/reinjury of lumbar area    Time 3   Period Weeks   Status Achieved     PT LONG TERM GOAL #3   Title Patient to show normalized gait mechanics, including equal pelvic mobility, reduced rigidity of lumbar and thoracic spines, and  pain with extended gait no more than 2/10 in order to improved tolerance to extended weight bearing activities    Baseline 10/9- improving    Time 3   Period Weeks   Status On-going     PT LONG TERM GOAL #4   Title Patient to report she has been able to return to her normal work based and recreational activities with pain no more than 2/10 in order to improve QOL    Baseline 10/9- pain still around 5/10; has not tried to do any tasks besides work    Time 3   Period Weeks   Status On-going               Plan - 05/19/16 1343    Clinical Impression Statement Re-assessment performed today. Patient does report some subjective improvements, including reduction in consistency and intensity of her pain, as well as improved tolerance to sitting/standing tasks and tolerance to her work in general. However, upon examination, patient does reveal ongoing lumbar hypomobility with apparent preference for flexion (extension is now causing peripheralization of symptoms), ongoing sensory impairment in L5-S1 primarily, is pain limited with functional task performance, and reduced tolerance to static activities at this time. Workers comp has officially approved 6 additional visits for patient at  this time. At this point recommend skilled PT services with a focus on flexion based activities/tasks in order to continue working on pain reduction, improving functional impairments, and assisting in return to optimal level of function.    Rehab Potential Good   PT Frequency Other (comment)  1-2 times per week    PT Duration 4 weeks   PT Treatment/Interventions ADLs/Self Care Home Management;Biofeedback;Cryotherapy;Moist Heat;Gait training;Stair training;Functional mobility training;Therapeutic activities;Therapeutic exercise;Balance training;Neuromuscular re-education;Patient/family education;Manual techniques;Passive range of motion;Energy conservation;Taping   PT Next Visit Plan focus on flexion based  activities, AVOID EXTENSION DUE TO PERIPHERALIZATION; sciatic flossing, proximal strength   PT Home Exercise Plan 9/20: bridges, cautious lumbar rotations, 12-6 pelvic clocks; 10/9: DKTC, sciatic flossing    Consulted and Agree with Plan of Care Patient      Patient will benefit from skilled therapeutic intervention in order to improve the following deficits and impairments:  Abnormal gait, Impaired sensation, Improper body mechanics, Pain, Decreased mobility, Postural dysfunction, Decreased activity tolerance, Decreased range of motion, Hypomobility, Difficulty walking  Visit Diagnosis: Muscle weakness (generalized) - Plan: PT plan of care cert/re-cert  Abnormal posture - Plan: PT plan of care cert/re-cert  Other symptoms and signs involving the musculoskeletal system - Plan: PT plan of care cert/re-cert     Problem List Patient Active Problem List   Diagnosis Date Noted  . Weakness generalized 11/15/2012  . Paresthesias 11/15/2012  . Pain in limb 11/15/2012  . Numbness and tingling 11/15/2012    Deniece Ree PT, DPT [place] [address] [place], [place], [address] Phone: [phone]   Fax:  [phone]  Name: Melissa Blackburn MRN: [id number] Date of Birth: 04/29/1956

## 2016-05-21 ENCOUNTER — Ambulatory Visit (HOSPITAL_COMMUNITY): Payer: PRIVATE HEALTH INSURANCE | Admitting: Physical Therapy

## 2016-05-21 ENCOUNTER — Other Ambulatory Visit: Payer: Self-pay | Admitting: Orthopedic Surgery

## 2016-05-21 DIAGNOSIS — M6281 Muscle weakness (generalized): Secondary | ICD-10-CM

## 2016-05-21 DIAGNOSIS — R293 Abnormal posture: Secondary | ICD-10-CM

## 2016-05-21 DIAGNOSIS — R29898 Other symptoms and signs involving the musculoskeletal system: Secondary | ICD-10-CM

## 2016-05-21 DIAGNOSIS — M4316 Spondylolisthesis, lumbar region: Secondary | ICD-10-CM

## 2016-05-21 NOTE — Therapy (Signed)
[place], [place], [address] Phone: [phone]   Fax:  [phone]  Physical Therapy Treatment  Patient Details  Name: Melissa Blackburn MRN: [id number] Date of Birth: [birth year] Referring Provider: Carlota Raspberry  Encounter Date: 05/21/2016      PT End of Session - 05/21/16 1345    Visit Number 5   Number of Visits 12   Date for PT Re-Evaluation 06/16/16   Authorization Type Worker's Comp (as of 04/30/16, patient only has 6 sessions; as of 10/9, 6 more additional sessions approved)   Authorization Time Period 04/30/16 to 123XX123; recert done [id number]   Authorization - Visit Number 5   Authorization - Number of Visits 12   PT Start Time 1302   PT Stop Time 1342   PT Time Calculation (min) 40 min   Activity Tolerance Patient tolerated treatment well   Behavior During Therapy [facility] for tasks assessed/performed      Past Medical History:  Diagnosis Date  . Anemia    hx of  . Asthma    as a child  . Complication of anesthesia   . DDD (degenerative disc disease)   . Degenerative disc disease   . Hypertension   . Migraine   . Migraine   . Needle stick injury with contaminated needle    patient undergoing testing  . Numbness and tingling 11/15/2012  . Pain in limb 11/15/2012  . Paresthesias 11/15/2012  . Pneumonia    hx of  . PONV (postoperative nausea and vomiting)    "severe nausea and vomiting"  . Sleep apnea    cpap 7  . Weakness generalized 11/15/2012    Past Surgical History:  Procedure Laterality Date  . ABDOMINAL HYSTERECTOMY    . ESOPHAGOGASTRODUODENOSCOPY (EGD) WITH PROPOFOL N/A 01/10/2016   Procedure: ESOPHAGOGASTRODUODENOSCOPY (EGD) WITH PROPOFOL;  Surgeon: Milus Banister, MD;  Location: [facility];  Service: Endoscopy;  Laterality: N/A;  . KNEE ARTHROSCOPY  04/23/2012   Procedure: ARTHROSCOPY KNEE;  Surgeon: Augustin Schooling, MD;  Location: [facility];  Service: Orthopedics;  Laterality: Left;   lateral meniscectomy  . KNEE SURGERY     x 4  . RECTOCELE REPAIR    . SHOULDER SURGERY     x 5  . UPPER ESOPHAGEAL ENDOSCOPIC ULTRASOUND (EUS)  01/10/2016   Procedure: UPPER ESOPHAGEAL ENDOSCOPIC ULTRASOUND (EUS);  Surgeon: Milus Banister, MD;  Location: [facility];  Service: Endoscopy;;    There were no vitals filed for this visit.      Subjective Assessment - 05/21/16 1305    Subjective Patient arrives stating that she is just feeling "OK" today, she had a flare up in her pain and she is not sure what caused it. No major changes since last session.    Pertinent History DDD   Patient Stated Goals get rid of pain, get back to outdoor activities like hunting, get back to PLOF    Currently in Pain? Yes   Pain Score 5                          [facility] Adult PT Treatment/Exercise - 05/21/16 0001      Lumbar Exercises: Stretches   Single Knee to Chest Stretch 5 reps;10 seconds   Double Knee to Chest Stretch 3 reps;10 seconds   Lower Trunk Rotation 5 reps;10 seconds   Piriformis Stretch 2 reps;30 seconds   Piriformis Stretch Limitations supine  Manual Therapy   Manual Therapy Soft tissue mobilization;Joint mobilization   Manual therapy comments Manual complete separate rest of tx   Joint Mobilization grade II-III PAs L1-5    Soft tissue mobilization L glutes/piriformis/paraspinals                 PT Education - 05/21/16 1507    Education provided No          PT Short Term Goals - 05/19/16 1312      PT SHORT TERM GOAL #1   Title Patient to experience pain no more than 2/10 during all functional tasks and activities in order to improve QOL and functional task performance    Baseline 10/9- at work can get to 5/10, 6/10 is a highest    Time 10   Period Days   Status On-going     PT SHORT TERM GOAL #2   Title Patient to be able to maintain correct posture at least 75% of the time in order to assist in reducing pain and improving functional task  performance    Time 10   Period Days   Status Achieved     PT SHORT TERM GOAL #3   Title Paitent to demonstrate full ROM in all directions of her lumbar spine with no increase in pain to show general improvement of condition    Baseline 10/9- improving    Time 10   Period Days   Status On-going     PT SHORT TERM GOAL #4   Title Patient to be independent in correctly and consistently performing appropriate HEP, to be updated PRN    Baseline 10/9- bridges, pelvic tiles, supine marches, pOE but has peripheralization    Time 10   Period Days   Status Achieved           PT Long Term Goals - 05/19/16 1314      PT LONG TERM GOAL #1   Title Patient to demonstrate functional strength 5/5 in all tested muscle groups in order to assist in reducing pain and facilitating return to PLOF    Baseline 10/9- improving except for proximal strength L    Time 3   Period Weeks   Status On-going     PT LONG TERM GOAL #2   Title Patient to demonstrate correct functional lifting mechanics in order to assist in preventing exacerbation of pain/reinjury of lumbar area    Time 3   Period Weeks   Status Achieved     PT LONG TERM GOAL #3   Title Patient to show normalized gait mechanics, including equal pelvic mobility, reduced rigidity of lumbar and thoracic spines, and pain with extended gait no more than 2/10 in order to improved tolerance to extended weight bearing activities    Baseline 10/9- improving    Time 3   Period Weeks   Status On-going     PT LONG TERM GOAL #4   Title Patient to report she has been able to return to her normal work based and recreational activities with pain no more than 2/10 in order to improve QOL    Baseline 10/9- pain still around 5/10; has not tried to do any tasks besides work    Time 3   Period Weeks   Status On-going               Plan - 05/21/16 1346    Clinical Impression Statement Patient arrives today stating she has had a flare-up of her  pain,  she is not sure what she did, if anything, to cause it; new HEP additions are going well with no major problems thus far. Continued work on flexion based exercises today as well as functional stretching and introduced focus on manual to L glutes/piriformis/paraspinals with good patient response, noting pain decrease to 2/10 as compared to 5/10 at beginning of session. Suspect likely piriformis/heavy soft tissue involvement and recommend increased focus on STM moving forward.     Rehab Potential Good   PT Frequency Other (comment)  1-2 times per week    PT Duration 4 weeks   PT Next Visit Plan focus on flexion based activities, AVOID EXTENSION DUE TO PERIPHERALIZATION; sciatic flossing, proximal strength. Increase focus on manual.    PT Home Exercise Plan 9/20: bridges, cautious lumbar rotations, 12-6 pelvic clocks; 10/9: DKTC, sciatic flossing    Consulted and Agree with Plan of Care Patient      Patient will benefit from skilled therapeutic intervention in order to improve the following deficits and impairments:  Abnormal gait, Impaired sensation, Improper body mechanics, Pain, Decreased mobility, Postural dysfunction, Decreased activity tolerance, Decreased range of motion, Hypomobility, Difficulty walking  Visit Diagnosis: Muscle weakness (generalized)  Abnormal posture  Other symptoms and signs involving the musculoskeletal system     Problem List Patient Active Problem List   Diagnosis Date Noted  . Weakness generalized 11/15/2012  . Paresthesias 11/15/2012  . Pain in limb 11/15/2012  . Numbness and tingling 11/15/2012    Deniece Ree PT, DPT [place] [address] [place], [place], [address] Phone: [phone]   Fax:  [phone]  Name: Melissa Blackburn MRN: [id number] Date of Birth: 08-30-1955

## 2016-05-28 ENCOUNTER — Other Ambulatory Visit: Payer: Self-pay | Admitting: Orthopedic Surgery

## 2016-05-28 ENCOUNTER — Ambulatory Visit
Admission: RE | Admit: 2016-05-28 | Discharge: 2016-05-28 | Disposition: A | Discharge status: Home / Self Care | Payer: PRIVATE HEALTH INSURANCE | Source: Ambulatory Visit | Attending: Orthopedic Surgery | Admitting: Orthopedic Surgery

## 2016-05-28 ENCOUNTER — Encounter (HOSPITAL_COMMUNITY): Payer: PRIVATE HEALTH INSURANCE

## 2016-05-28 DIAGNOSIS — M4316 Spondylolisthesis, lumbar region: Secondary | ICD-10-CM

## 2016-05-28 MED ORDER — IOPAMIDOL (ISOVUE-M 200) INJECTION 41%
1.0000 mL | Freq: Once | INTRAMUSCULAR | Status: AC
  Administered 2016-05-28: 1 mL via EPIDURAL

## 2016-05-28 MED ORDER — METHYLPREDNISOLONE ACETATE 40 MG/ML INJ SUSP (RADIOLOG
120.0000 mg | Freq: Once | INTRAMUSCULAR | Status: AC
  Administered 2016-05-28: 120 mg via EPIDURAL

## 2016-05-28 NOTE — Discharge Instructions (Signed)

## 2016-06-02 ENCOUNTER — Ambulatory Visit (HOSPITAL_COMMUNITY): Payer: PRIVATE HEALTH INSURANCE | Attending: Family Medicine

## 2016-06-02 DIAGNOSIS — M6281 Muscle weakness (generalized): Secondary | ICD-10-CM | POA: Insufficient documentation

## 2016-06-02 DIAGNOSIS — R29898 Other symptoms and signs involving the musculoskeletal system: Secondary | ICD-10-CM | POA: Diagnosis present

## 2016-06-02 DIAGNOSIS — R293 Abnormal posture: Secondary | ICD-10-CM | POA: Insufficient documentation

## 2016-06-02 NOTE — Therapy (Signed)
[place], [place], [address] Phone: [phone]   Fax:  [phone]  Physical Therapy Treatment  Patient Details  Name: Melissa Blackburn MRN: [id number] Date of Birth: [birth year] Referring Provider: Carlota Raspberry  Encounter Date: 06/02/2016      PT End of Session - 06/02/16 1349    Visit Number 6   Number of Visits 12   Date for PT Re-Evaluation 06/16/16   Authorization Type Worker's Comp (as of 04/30/16, patient only has 6 sessions; as of 10/9, 6 more additional sessions approved)   Authorization Time Period 04/30/16 to 123XX123; recert done [id number]   Authorization - Visit Number 6   Authorization - Number of Visits 12   PT Start Time [id number]   PT Stop Time 1348   PT Time Calculation (min) 31 min   Activity Tolerance Patient tolerated treatment well   Behavior During Therapy [facility] for tasks assessed/performed      Past Medical History:  Diagnosis Date  . Anemia    hx of  . Asthma    as a child  . Complication of anesthesia   . DDD (degenerative disc disease)   . Degenerative disc disease   . Hypertension   . Migraine   . Migraine   . Needle stick injury with contaminated needle    patient undergoing testing  . Numbness and tingling 11/15/2012  . Pain in limb 11/15/2012  . Paresthesias 11/15/2012  . Pneumonia    hx of  . PONV (postoperative nausea and vomiting)    "severe nausea and vomiting"  . Sleep apnea    cpap 7  . Weakness generalized 11/15/2012    Past Surgical History:  Procedure Laterality Date  . ABDOMINAL HYSTERECTOMY    . ESOPHAGOGASTRODUODENOSCOPY (EGD) WITH PROPOFOL N/A 01/10/2016   Procedure: ESOPHAGOGASTRODUODENOSCOPY (EGD) WITH PROPOFOL;  Surgeon: Milus Banister, MD;  Location: [facility];  Service: Endoscopy;  Laterality: N/A;  . KNEE ARTHROSCOPY  04/23/2012   Procedure: ARTHROSCOPY KNEE;  Surgeon: Augustin Schooling, MD;  Location: [place];  Service: Orthopedics;  Laterality: Left;   lateral meniscectomy  . KNEE SURGERY     x 4  . RECTOCELE REPAIR    . SHOULDER SURGERY     x 5  . UPPER ESOPHAGEAL ENDOSCOPIC ULTRASOUND (EUS)  01/10/2016   Procedure: UPPER ESOPHAGEAL ENDOSCOPIC ULTRASOUND (EUS);  Surgeon: Milus Banister, MD;  Location: [facility];  Service: Endoscopy;;    There were no vitals filed for this visit.      Subjective Assessment - 06/02/16 1323    Subjective Pt reports she is feeling better overall. She had an epidural on Wednesday and has been free of sciatic pain symptoms on L leg. R leg continues to feel somewhat painful.    Pertinent History DDD   Currently in Pain? Yes   Pain Score 2    Pain Location --  R sided sciatic electrical pain from buttocks to plantar surface of foot                         [facility] Adult PT Treatment/Exercise - 06/02/16 0001      Lumbar Exercises: Stretches   Single Knee to Chest Stretch 30 seconds;5 reps   Double Knee to Chest Stretch Other (comment)  Repeated Supine Flexion: 20x      Manual Therapy   Joint Mobilization --   Soft tissue mobilization L glute med/piriformis/quadratus lumborum.  13 minutes                  PT Short Term Goals - 05/19/16 1312      PT SHORT TERM GOAL #1   Title Patient to experience pain no more than 2/10 during all functional tasks and activities in order to improve QOL and functional task performance    Baseline 10/9- at work can get to 5/10, 6/10 is a highest    Time 10   Period Days   Status On-going     PT SHORT TERM GOAL #2   Title Patient to be able to maintain correct posture at least 75% of the time in order to assist in reducing pain and improving functional task performance    Time 10   Period Days   Status Achieved     PT SHORT TERM GOAL #3   Title Paitent to demonstrate full ROM in all directions of her lumbar spine with no increase in pain to show general improvement of condition    Baseline 10/9- improving    Time 10   Period  Days   Status On-going     PT SHORT TERM GOAL #4   Title Patient to be independent in correctly and consistently performing appropriate HEP, to be updated PRN    Baseline 10/9- bridges, pelvic tiles, supine marches, pOE but has peripheralization    Time 10   Period Days   Status Achieved           PT Long Term Goals - 05/19/16 1314      PT LONG TERM GOAL #1   Title Patient to demonstrate functional strength 5/5 in all tested muscle groups in order to assist in reducing pain and facilitating return to PLOF    Baseline 10/9- improving except for proximal strength L    Time 3   Period Weeks   Status On-going     PT LONG TERM GOAL #2   Title Patient to demonstrate correct functional lifting mechanics in order to assist in preventing exacerbation of pain/reinjury of lumbar area    Time 3   Period Weeks   Status Achieved     PT LONG TERM GOAL #3   Title Patient to show normalized gait mechanics, including equal pelvic mobility, reduced rigidity of lumbar and thoracic spines, and pain with extended gait no more than 2/10 in order to improved tolerance to extended weight bearing activities    Baseline 10/9- improving    Time 3   Period Weeks   Status On-going     PT LONG TERM GOAL #4   Title Patient to report she has been able to return to her normal work based and recreational activities with pain no more than 2/10 in order to improve QOL    Baseline 10/9- pain still around 5/10; has not tried to do any tasks besides work    Time 3   Period Weeks   Status On-going               Plan - 06/02/16 1349    Clinical Impression Statement Pt arrived early and squeezed into new time slot last minute. Heavy emphasis on repeated flexion, adn stretches, as well as soft tissue release to QL, piriformis, glute med and iliocostalis lumborum all on the Left side. Note release of soft tissue with strong twitch response througout QL. After treatent pt noted to be hainvg less pain and  immediate cavitation upon bed mobility, a sign that  mobility had increased. Pt tolerating treatment well, with decrease in pain, and progress toward goals overall.    Rehab Potential Good   PT Frequency Other (comment)   PT Duration 4 weeks   PT Treatment/Interventions ADLs/Self Care Home Management;Biofeedback;Cryotherapy;Moist Heat;Gait training;Stair training;Functional mobility training;Therapeutic activities;Therapeutic exercise;Balance training;Neuromuscular re-education;Patient/family education;Manual techniques;Passive range of motion;Energy conservation;Taping   PT Next Visit Plan focus on flexion based activities, AVOID EXTENSION DUE TO PERIPHERALIZATION; sciatic flossing, proximal strength. Increase focus on manual (Left glute med, Left QL)    PT Home Exercise Plan 9/20: bridges, cautious lumbar rotations, 12-6 pelvic clocks; 10/9: DKTC, sciatic flossing    Consulted and Agree with Plan of Care Patient      Patient will benefit from skilled therapeutic intervention in order to improve the following deficits and impairments:  Abnormal gait, Impaired sensation, Improper body mechanics, Pain, Decreased mobility, Postural dysfunction, Decreased activity tolerance, Decreased range of motion, Hypomobility, Difficulty walking  Visit Diagnosis: Muscle weakness (generalized)  Abnormal posture  Other symptoms and signs involving the musculoskeletal system     Problem List Patient Active Problem List   Diagnosis Date Noted  . Weakness generalized 11/15/2012  . Paresthesias 11/15/2012  . Pain in limb 11/15/2012  . Numbness and tingling 11/15/2012   1:53 PM, 06/02/16 Etta Grandchild, PT, DPT Physical Therapist at [employer] [phone] (office)     [place] [address] [place], [place], [address] Phone: [phone]   Fax:  [phone]  Name: Melissa Blackburn MRN: [id number] Date of Birth:  April 16, 1956

## 2016-06-10 DIAGNOSIS — G9050 Complex regional pain syndrome I, unspecified: Secondary | ICD-10-CM | POA: Diagnosis not present

## 2016-06-13 ENCOUNTER — Ambulatory Visit (HOSPITAL_COMMUNITY): Payer: PRIVATE HEALTH INSURANCE | Attending: Family Medicine

## 2016-06-13 DIAGNOSIS — R293 Abnormal posture: Secondary | ICD-10-CM | POA: Diagnosis present

## 2016-06-13 DIAGNOSIS — M6281 Muscle weakness (generalized): Secondary | ICD-10-CM | POA: Diagnosis not present

## 2016-06-13 DIAGNOSIS — R29898 Other symptoms and signs involving the musculoskeletal system: Secondary | ICD-10-CM | POA: Insufficient documentation

## 2016-06-13 NOTE — Therapy (Signed)
[place], [place], [address] Phone: [phone]   Fax:  [phone]  Physical Therapy Treatment  Patient Details  Name: Melissa Blackburn MRN: [id number] Date of Birth: [birth year] Referring Provider: Carlota Raspberry  Encounter Date: 06/13/2016      PT End of Session - 06/13/16 1526    Visit Number 7   Number of Visits 12   Date for PT Re-Evaluation 06/16/16   Authorization Type Worker's Comp (as of 04/30/16, patient only has 6 sessions; as of 10/9, 6 more additional sessions approved)   Authorization Time Period 04/30/16 to [id number]; recert done [id number]   Authorization - Visit Number 7   Authorization - Number of Visits 12   PT Start Time [id number]   PT Stop Time 1349   PT Time Calculation (min) 46 min   Activity Tolerance Patient tolerated treatment well   Behavior During Therapy [facility] for tasks assessed/performed      Past Medical History:  Diagnosis Date  . Anemia    hx of  . Asthma    as a child  . Complication of anesthesia   . DDD (degenerative disc disease)   . Degenerative disc disease   . Hypertension   . Migraine   . Migraine   . Needle stick injury with contaminated needle    patient undergoing testing  . Numbness and tingling 11/15/2012  . Pain in limb 11/15/2012  . Paresthesias 11/15/2012  . Pneumonia    hx of  . PONV (postoperative nausea and vomiting)    "severe nausea and vomiting"  . Sleep apnea    cpap 7  . Weakness generalized 11/15/2012    Past Surgical History:  Procedure Laterality Date  . ABDOMINAL HYSTERECTOMY    . ESOPHAGOGASTRODUODENOSCOPY (EGD) WITH PROPOFOL N/A 01/10/2016   Procedure: ESOPHAGOGASTRODUODENOSCOPY (EGD) WITH PROPOFOL;  Surgeon: Milus Banister, MD;  Location: [facility];  Service: Endoscopy;  Laterality: N/A;  . KNEE ARTHROSCOPY  04/23/2012   Procedure: ARTHROSCOPY KNEE;  Surgeon: Augustin Schooling, MD;  Location: [facility];  Service: Orthopedics;  Laterality: Left;   lateral meniscectomy  . KNEE SURGERY     x 4  . RECTOCELE REPAIR    . SHOULDER SURGERY     x 5  . UPPER ESOPHAGEAL ENDOSCOPIC ULTRASOUND (EUS)  01/10/2016   Procedure: UPPER ESOPHAGEAL ENDOSCOPIC ULTRASOUND (EUS);  Surgeon: Milus Banister, MD;  Location: [facility];  Service: Endoscopy;;    There were no vitals filed for this visit.      Subjective Assessment - 06/13/16 1304    Subjective Pt reports she is feeling like she is making improvements. She continues to work on [employer]. She said she has no symptoms in the left leg and reduced pain in the right leg only to the calf.   Pertinent History DDD   Currently in Pain? Yes   Pain Score 2    Pain Orientation Right   Pain Descriptors / Indicators Burning            [facility] PT Assessment - 06/13/16 0001      Flexibility   Hamstrings --  HS feel hypotonic on Left side; Calf, mildly different   Piriformis Spasm on Right, with Spasm in glute max as well.     Special Tests    Special Tests --  SLR: excessive on Left side, sciatic pain worse at 170 degre  [facility] Adult PT Treatment/Exercise - 06/13/16 0001      Lumbar Exercises: Stretches   Single Knee to Chest Stretch 30 seconds;3 reps   Double Knee to Chest Stretch Other (comment)  Repeated Supine Flexion: 25x    Lower Trunk Rotation --  Chair rotation Stretch 2x30sec bilat (HEP addition)    Prone on Elbows Stretch Other (comment)  Repeated prone in extension: 3x10   Prone on Elbows Stretch Limitations 1x10 overpessure at L4, 1x10 OP @ L3, 1x10 OP @ L2     Manual Therapy   Manual Therapy Passive ROM;Joint mobilization   Joint Mobilization grade IV PA mobs L1-5, 1x60sec   PA hip glide 2x45sec Left side   Passive ROM Prone Hip extension stretch 2x30s, stabilizing pelvis  Hip IR/ER stretch bilat 2x30sec                  PT Short Term Goals - 05/19/16 1312      PT SHORT TERM GOAL #1   Title Patient to experience pain no more than  2/10 during all functional tasks and activities in order to improve QOL and functional task performance    Baseline 10/9- at work can get to 5/10, 6/10 is a highest    Time 10   Period Days   Status On-going     PT SHORT TERM GOAL #2   Title Patient to be able to maintain correct posture at least 75% of the time in order to assist in reducing pain and improving functional task performance    Time 10   Period Days   Status Achieved     PT SHORT TERM GOAL #3   Title Paitent to demonstrate full ROM in all directions of her lumbar spine with no increase in pain to show general improvement of condition    Baseline 10/9- improving    Time 10   Period Days   Status On-going     PT SHORT TERM GOAL #4   Title Patient to be independent in correctly and consistently performing appropriate HEP, to be updated PRN    Baseline 10/9- bridges, pelvic tiles, supine marches, pOE but has peripheralization    Time 10   Period Days   Status Achieved           PT Long Term Goals - 05/19/16 1314      PT LONG TERM GOAL #1   Title Patient to demonstrate functional strength 5/5 in all tested muscle groups in order to assist in reducing pain and facilitating return to PLOF    Baseline 10/9- improving except for proximal strength L    Time 3   Period Weeks   Status On-going     PT LONG TERM GOAL #2   Title Patient to demonstrate correct functional lifting mechanics in order to assist in preventing exacerbation of pain/reinjury of lumbar area    Time 3   Period Weeks   Status Achieved     PT LONG TERM GOAL #3   Title Patient to show normalized gait mechanics, including equal pelvic mobility, reduced rigidity of lumbar and thoracic spines, and pain with extended gait no more than 2/10 in order to improved tolerance to extended weight bearing activities    Baseline 10/9- improving    Time 3   Period Weeks   Status On-going     PT LONG TERM GOAL #4   Title Patient to report she has been able to  return to her normal work based and  recreational activities with pain no more than 2/10 in order to improve QOL    Baseline 10/9- pain still around 5/10; has not tried to do any tasks besides work    Time 3   Period Weeks   Status On-going               Plan - 06/13/16 1527    Clinical Impression Statement Pt reports stable progress overall with continued sciatic pain from posterior R hip to the mid calf that does not change with activity; SLR only aggravates Sx after extreme end range. Hamstrings on left side feeling somewhat lower tone, but unclear if this is related to surgical history. Pt able to perform all exercises at home and in session within safe range with improved proprioception, but it is unclear if progress has been more a product of epidural injection than PT. Limited extension noted in Left hip (50-75% that of contrlateral side) which responded well to manual therapy. Upper lumber extension also limited, but responding well to mobilization.    Rehab Potential Good   PT Frequency Other (comment)   PT Duration 4 weeks   PT Treatment/Interventions ADLs/Self Care Home Management;Biofeedback;Cryotherapy;Moist Heat;Gait training;Stair training;Functional mobility training;Therapeutic activities;Therapeutic exercise;Balance training;Neuromuscular re-education;Patient/family education;Manual techniques;Passive range of motion;Energy conservation;Taping   PT Next Visit Plan focus on flexion based activities, AVOID EXTENSION DUE TO PERIPHERALIZATION; sciatic flossing, proximal strength. Increase focus on manual (Left glute med, Left QL)    PT Home Exercise Plan 9/20: bridges, cautious lumbar rotations, 12-6 pelvic clocks; 10/9: DKTC, sciatic flossing; 11/3: Added rotational stretch in chair.    Consulted and Agree with Plan of Care Patient      Patient will benefit from skilled therapeutic intervention in order to improve the following deficits and impairments:  Abnormal gait,  Impaired sensation, Improper body mechanics, Pain, Decreased mobility, Postural dysfunction, Decreased activity tolerance, Decreased range of motion, Hypomobility, Difficulty walking  Visit Diagnosis: Muscle weakness (generalized)  Abnormal posture  Other symptoms and signs involving the musculoskeletal system     Problem List Patient Active Problem List   Diagnosis Date Noted  . Weakness generalized 11/15/2012  . Paresthesias 11/15/2012  . Pain in limb 11/15/2012  . Numbness and tingling 11/15/2012    3:32 PM, 06/13/16 Etta Grandchild, PT, DPT Physical Therapist at [facility] [phone] (office)     [place] [address] [place], [place], [address] Phone: [phone]   Fax:  [phone]  Name: Melissa Blackburn MRN: [id number] Date of Birth: 08/30/1955

## 2016-06-16 ENCOUNTER — Telehealth (HOSPITAL_COMMUNITY): Payer: Self-pay | Admitting: Physical Therapy

## 2016-06-16 MED FILL — METHOCARBAMOL 500 MG TABLET: 500 | 20 days supply | Qty: 60 | Fill #0

## 2016-06-16 MED FILL — ATENOLOL 25 MG TABLET: 25 | 30 days supply | Qty: 30 | Fill #0

## 2016-06-16 MED FILL — AMITRIPTYLINE HCL 50 MG TAB: 50 | 30 days supply | Qty: 60 | Fill #4

## 2016-06-16 NOTE — Telephone Encounter (Signed)
no payment due   Worker's Comp Called pt to schedule l/m, approved for 8 more visits per Dr. Lynann Bologna, Please include this MD to receive progress notes at [facility] in [place].NF 06/16/16

## 2016-06-17 ENCOUNTER — Telehealth (HOSPITAL_COMMUNITY): Payer: Self-pay | Admitting: Physical Therapy

## 2016-06-17 NOTE — Telephone Encounter (Signed)
Returned phone call concerning this pt l/m with Jeannie to say I'll be glad to help with this pt when she calls me back. NF

## 2016-06-18 ENCOUNTER — Telehealth (HOSPITAL_COMMUNITY): Payer: Self-pay | Admitting: Physical Therapy

## 2016-06-18 ENCOUNTER — Ambulatory Visit (HOSPITAL_COMMUNITY): Payer: PRIVATE HEALTH INSURANCE | Attending: Family Medicine

## 2016-06-18 DIAGNOSIS — R293 Abnormal posture: Secondary | ICD-10-CM | POA: Diagnosis present

## 2016-06-18 DIAGNOSIS — R29898 Other symptoms and signs involving the musculoskeletal system: Secondary | ICD-10-CM | POA: Diagnosis present

## 2016-06-18 DIAGNOSIS — M6281 Muscle weakness (generalized): Secondary | ICD-10-CM | POA: Diagnosis present

## 2016-06-18 NOTE — Therapy (Signed)
[place], [place], [address] Phone: [phone]   Fax:  [phone]  Physical Therapy Treatment  Patient Details  Name: Melissa Blackburn MRN: [id number] Date of Birth: 1955/09/24 Referring Provider: Carlota Raspberry  Encounter Date: 06/18/2016      PT End of Session - 06/18/16 1652    Visit Number 8   Number of Visits 18  12+6 additional sessions approved   Date for PT Re-Evaluation 08/01/16   Authorization Type Worker's Comp (as of 04/30/16, patient only has 6 sessions; as of 10/9, 6 more additional sessions approved)   Authorization Time Period 04/30/16 to 123XX123; recert done [id number]   Authorization - Visit Number 8   Authorization - Number of Visits 18   PT Start Time 1300   PT Stop Time 1345   PT Time Calculation (min) 45 min   Activity Tolerance Patient limited by pain   Behavior During Therapy [facility] for tasks assessed/performed      Past Medical History:  Diagnosis Date  . Anemia    hx of  . Asthma    as a child  . Complication of anesthesia   . DDD (degenerative disc disease)   . Degenerative disc disease   . Hypertension   . Migraine   . Migraine   . Needle stick injury with contaminated needle    patient undergoing testing  . Numbness and tingling 11/15/2012  . Pain in limb 11/15/2012  . Paresthesias 11/15/2012  . Pneumonia    hx of  . PONV (postoperative nausea and vomiting)    "severe nausea and vomiting"  . Sleep apnea    cpap 7  . Weakness generalized 11/15/2012    Past Surgical History:  Procedure Laterality Date  . ABDOMINAL HYSTERECTOMY    . ESOPHAGOGASTRODUODENOSCOPY (EGD) WITH PROPOFOL N/A 01/10/2016   Procedure: ESOPHAGOGASTRODUODENOSCOPY (EGD) WITH PROPOFOL;  Surgeon: Milus Banister, MD;  Location: [facility];  Service: Endoscopy;  Laterality: N/A;  . KNEE ARTHROSCOPY  04/23/2012   Procedure: ARTHROSCOPY KNEE;  Surgeon: Augustin Schooling, MD;  Location: [place];  Service:  Orthopedics;  Laterality: Left;  lateral meniscectomy  . KNEE SURGERY     x 4  . RECTOCELE REPAIR    . SHOULDER SURGERY     x 5  . UPPER ESOPHAGEAL ENDOSCOPIC ULTRASOUND (EUS)  01/10/2016   Procedure: UPPER ESOPHAGEAL ENDOSCOPIC ULTRASOUND (EUS);  Surgeon: Milus Banister, MD;  Location: [facility];  Service: Endoscopy;;    There were no vitals filed for this visit.      Subjective Assessment - 06/18/16 1302    Subjective Spoke with the physician yesterday, and the facet on one side is gone.  When she flexes fwd, it is causing the vertebrae to slide over top and pinching the nerves.  Has decided that she will need surgery.  Wants to do it in Jan.  Will be a decompression with a fusion and a cage.     Pertinent History DDD   How long can you sit comfortably? 10/9- 45-60 minutes    How long can you stand comfortably? 10/9- 45-60 minutes, rectal pain has improved    How long can you walk comfortably? 10/9- better, can do a full shift at work but really feels the pain later on    Patient Stated Goals get rid of pain, get back to outdoor activities like hunting, get back to PLOF    Currently in Pain? Yes   Pain Score  4    Pain Location Leg   Pain Orientation Right   Pain Descriptors / Indicators Burning   Pain Type Acute pain   Pain Radiating Towards down to the calf.    Pain Onset 1 to 4 weeks ago   Pain Frequency Constant   Aggravating Factors  bending over, twisting, lifting.     Pain Relieving Factors laying with LE's elevated   Effect of Pain on Daily Activities interfering with job duties/PLOF.              [facility] PT Assessment - 06/18/16 0001      Assessment   Medical Diagnosis lumbar spine sprain    Referring Provider Carlota Raspberry   Onset Date/Surgical Date 04/10/16   Next MD Visit Pt also reports she had an appt today.       Precautions   Precaution Comments cannot lift more than 10#, don't stand for long periods of time, limit bending      Strength   Right  Hip Flexion 5/5   Right Hip Extension 3+/5   Right Hip ABduction 3+/5   Left Hip Flexion 5/5   Left Hip Extension 3+/5   Left Hip ABduction 3+/5   Right Knee Flexion 5/5   Right Knee Extension 5/5   Left Knee Flexion 5/5   Left Knee Extension 4/5  with pain   Right Ankle Dorsiflexion 5/5   Left Ankle Dorsiflexion 5/5     Flexibility   Hamstrings Spasm in R HS multiple times when activating HS or glutes                     [facility] Adult PT Treatment/Exercise - 06/18/16 0001      Lumbar Exercises: Stretches   Active Hamstring Stretch 2 reps;30 seconds  R -seated due to cramping.      Lumbar Exercises: Standing   Other Standing Lumbar Exercises pelvic tilt in standing x 10 reps.      Lumbar Exercises: Supine   Ab Set 10 reps   Other Supine Lumbar Exercises sciatic flossing supine x 15 bilaterally.    Other Supine Lumbar Exercises pelvic clock - limited due to pain with increased lordosis.     Lumbar Exercises: Prone   Other Prone Lumbar Exercises POE x 1 min, then POE with Alt UE's x 10    Other Prone Lumbar Exercises Attempted modified plank on elbows, but unable due to HS spasm     Lumbar Exercises: Quadruped   Straight Leg Raise Limitations  Attempted, but unable due to R hamstring spasm     Manual Therapy   Manual Therapy Soft tissue mobilization;Myofascial release   Manual therapy comments Manual complete separate rest of tx   Soft tissue mobilization L glute med/piriformis/quadratus lumborum.    Myofascial Release QL longitudinal stretch in sidelying.                 PT Education - 06/18/16 1651    Education provided Yes   Education Details Updated HEP due to new restrictions with no bending/flexion per MD.     Terence Lux) Educated Patient   Methods Explanation;Demonstration;Handout   Comprehension Verbalized understanding;Returned demonstration          PT Short Term Goals - 06/18/16 1704      PT SHORT TERM GOAL #1   Title Patient to  experience pain no more than 2/10 during all functional tasks and activities in order to improve QOL and functional task performance  Baseline 10/9- at work can get to 5/10, 6/10 is a highest    Time 10   Period Days   Status On-going     PT SHORT TERM GOAL #2   Title Patient to be able to maintain correct posture at least 75% of the time in order to assist in reducing pain and improving functional task performance    Time 10   Period Days   Status Achieved     PT SHORT TERM GOAL #3   Title Paitent to demonstrate full ROM in all directions of her lumbar spine with no increase in pain to show general improvement of condition    Baseline 10/9- improving    Time 10   Period Days   Status On-going     PT SHORT TERM GOAL #4   Title Patient to be independent in correctly and consistently performing appropriate HEP, to be updated PRN    Baseline 10/9- bridges, pelvic tiles, supine marches, pOE but has peripheralization    Time 10   Period Days   Status Achieved           PT Long Term Goals - 06/18/16 1704      PT LONG TERM GOAL #1   Title Patient to demonstrate functional strength 5/5 in all tested muscle groups in order to assist in reducing pain and facilitating return to PLOF    Baseline 10/9- improving except for proximal strength L    Time 3   Period Weeks   Status On-going     PT LONG TERM GOAL #2   Title Patient to demonstrate correct functional lifting mechanics in order to assist in preventing exacerbation of pain/reinjury of lumbar area    Time 3   Period Weeks   Status Achieved     PT LONG TERM GOAL #3   Title Patient to show normalized gait mechanics, including equal pelvic mobility, reduced rigidity of lumbar and thoracic spines, and pain with extended gait no more than 2/10 in order to improved tolerance to extended weight bearing activities    Baseline 10/9- improving    Time 3   Period Weeks   Status On-going     PT LONG TERM GOAL #4   Title Patient to  report she has been able to return to her normal work based and recreational activities with pain no more than 2/10 in order to improve QOL    Baseline 10/9- pain still around 5/10; has not tried to do any tasks besides work    Time 3   Period Weeks   Status On-going               Plan - 06/18/16 1701    Clinical Impression Statement Pt presents today informing me that she will need to have surgery, and she is hoping to schedule it for sometime in January.  She states, that per MD request, she is restricted from doing flexion based exercises due to potential nerve damage with how the facet joint has deteriorated.  Pt was very limited with exercises today due to multiple R HS spasms when attempting to activate HS's or glutes.  Pt did well with bridging and with POE.  Will continue to progress stabilization while avoiding flexion of lumbar spine.     Rehab Potential Good   PT Frequency Other (comment)   PT Duration 4 weeks   PT Treatment/Interventions ADLs/Self Care Home Management;Biofeedback;Cryotherapy;Moist Heat;Gait training;Stair training;Functional mobility training;Therapeutic activities;Therapeutic exercise;Balance training;Neuromuscular re-education;Patient/family education;Manual techniques;Passive range of  motion;Energy conservation;Taping   PT Next Visit Plan PER MD - NO FLEXION BASED EXERCISES.  Focus on core stabilization with proximal strengthening,  Attempt tall kneeling due to unable to tolerate quadruped.   PT Home Exercise Plan 9/20: bridges, cautious lumbar rotations, 12-6 pelvic clocks; 10/9: DKTC, sciatic flossing; 11/3: Added rotational stretch in chair. 11/8 - removed all flexion based exercises from HEP, now doing bridges, POE, sciatic nerve flossing, and pelvic clocks with new handout given   Consulted and Agree with Plan of Care Patient      Patient will benefit from skilled therapeutic intervention in order to improve the following deficits and impairments:   Abnormal gait, Impaired sensation, Improper body mechanics, Pain, Decreased mobility, Postural dysfunction, Decreased activity tolerance, Decreased range of motion, Hypomobility, Difficulty walking  Visit Diagnosis: Muscle weakness (generalized)  Abnormal posture  Other symptoms and signs involving the musculoskeletal system     Problem List Patient Active Problem List   Diagnosis Date Noted  . Weakness generalized 11/15/2012  . Paresthesias 11/15/2012  . Pain in limb 11/15/2012  . Numbness and tingling 11/15/2012    Beth Dayveon Halley, PT, DPT X: [phone]   [place] [address] [place], [place], [address] Phone: [phone]   Fax:  [phone]  Name: Melissa Blackburn MRN: [id number] Date of Birth: 1955/09/15

## 2016-06-18 NOTE — Telephone Encounter (Signed)
L/m 06/16/16 and 06/17/16 @ [phone] returned phone call and l/m the this patient needed to reschedule more appointments. NF

## 2016-06-19 DIAGNOSIS — G43719 Chronic migraine without aura, intractable, without status migrainosus: Secondary | ICD-10-CM | POA: Diagnosis not present

## 2016-06-25 ENCOUNTER — Ambulatory Visit (HOSPITAL_COMMUNITY): Payer: PRIVATE HEALTH INSURANCE | Attending: Internal Medicine | Admitting: Physical Therapy

## 2016-06-25 DIAGNOSIS — M6281 Muscle weakness (generalized): Secondary | ICD-10-CM | POA: Diagnosis present

## 2016-06-25 DIAGNOSIS — R29898 Other symptoms and signs involving the musculoskeletal system: Secondary | ICD-10-CM | POA: Insufficient documentation

## 2016-06-25 DIAGNOSIS — R293 Abnormal posture: Secondary | ICD-10-CM | POA: Insufficient documentation

## 2016-06-25 NOTE — Addendum Note (Signed)
Addended by: Hunt Oris on: 06/25/2016 04:44 PM   Modules accepted: Orders

## 2016-06-25 NOTE — Therapy (Signed)
[place], [place], [address] Phone: [phone]   Fax:  [phone]  Physical Therapy Treatment  Patient Details  Name: CHESSICA GILLEAN MRN: [id number] Date of Birth: [birth year] Referring Provider: Carlota Raspberry  Encounter Date: 06/25/2016      PT End of Session - 06/25/16 1342    Visit Number 9   Number of Visits 18   Date for PT Re-Evaluation 08/01/16   Authorization Type Worker's Comp (as of 04/30/16, patient only has 6 sessions; as of 10/9, 6 more additional sessions approved)   Authorization Time Period 04/30/16 to [id number]; recert done [id number]   Authorization - Visit Number 9   Authorization - Number of Visits 18   PT Start Time 1302   PT Stop Time 1341   PT Time Calculation (min) 39 min   Activity Tolerance Patient tolerated treatment well   Behavior During Therapy [facility] for tasks assessed/performed      Past Medical History:  Diagnosis Date  . Anemia    hx of  . Asthma    as a child  . Complication of anesthesia   . DDD (degenerative disc disease)   . Degenerative disc disease   . Hypertension   . Migraine   . Migraine   . Needle stick injury with contaminated needle    patient undergoing testing  . Numbness and tingling 11/15/2012  . Pain in limb 11/15/2012  . Paresthesias 11/15/2012  . Pneumonia    hx of  . PONV (postoperative nausea and vomiting)    "severe nausea and vomiting"  . Sleep apnea    cpap 7  . Weakness generalized 11/15/2012    Past Surgical History:  Procedure Laterality Date  . ABDOMINAL HYSTERECTOMY    . ESOPHAGOGASTRODUODENOSCOPY (EGD) WITH PROPOFOL N/A 01/10/2016   Procedure: ESOPHAGOGASTRODUODENOSCOPY (EGD) WITH PROPOFOL;  Surgeon: Milus Banister, MD;  Location: [facility];  Service: Endoscopy;  Laterality: N/A;  . KNEE ARTHROSCOPY  04/23/2012   Procedure: ARTHROSCOPY KNEE;  Surgeon: Augustin Schooling, MD;  Location: [place];  Service: Orthopedics;  Laterality: Left;   lateral meniscectomy  . KNEE SURGERY     x 4  . RECTOCELE REPAIR    . SHOULDER SURGERY     x 5  . UPPER ESOPHAGEAL ENDOSCOPIC ULTRASOUND (EUS)  01/10/2016   Procedure: UPPER ESOPHAGEAL ENDOSCOPIC ULTRASOUND (EUS);  Surgeon: Milus Banister, MD;  Location: [facility];  Service: Endoscopy;;    There were no vitals filed for this visit.      Subjective Assessment - 06/25/16 1304    Subjective Patient arrives today stating that they are planning on surgery in January, MD wants PT to change focus to stability rather than ROM for safety for now. She has still been having really bad msucle spasms especially in her legs. She has been limited by muscle spasms during exercise; spasms have gotten bad more recently    Pertinent History DDD   Patient Stated Goals get rid of pain, get back to outdoor activities like hunting, get back to PLOF    Currently in Pain? Yes   Pain Score 5    Pain Location Other (Comment)  back and legs    Pain Orientation Right;Left   Pain Descriptors / Indicators Stabbing;Aching  stabbing legs, aching back    Pain Type Chronic pain   Pain Radiating Towards R LE down to calf, L LE is just the calf    Pain Onset More than a  month ago   Pain Frequency Constant   Aggravating Factors  bending/twisting/lifting, being in one position too long    Pain Relieving Factors Laying with LE's elevated    Effect of Pain on Daily Activities interferes with job duties/PLOF                          [facility] Adult PT Treatment/Exercise - 06/25/16 0001      Lumbar Exercises: Supine   Ab Set 15 reps   AB Set Limitations 3 second holds    Clam 10 reps   Clam Limitations red TB    Bent Knee Raise 10 reps   Bent Knee Raise Limitations with core set    Bridge 10 reps   Other Supine Lumbar Exercises dead bugs 1x10 with core set      Lumbar Exercises: Sidelying   Clam 10 reps   Clam Limitations red TB      Lumbar Exercises: Prone   Other Prone Lumbar Exercises  modified plank 3x5 seconds   Other Prone Lumbar Exercises hamstring curls 1x15 with 2# B      Lumbar Exercises: Quadruped   Straight Leg Raise 10 reps     Manual Therapy   Manual Therapy Soft tissue mobilization   Manual therapy comments Manual complete separate rest of tx   Soft tissue mobilization B lumbar paraspinals and proximal glutes                 PT Education - 06/25/16 1342    Education provided No          PT Short Term Goals - 06/18/16 1704      PT SHORT TERM GOAL #1   Title Patient to experience pain no more than 2/10 during all functional tasks and activities in order to improve QOL and functional task performance    Baseline 10/9- at work can get to 5/10, 6/10 is a highest    Time 10   Period Days   Status On-going     PT SHORT TERM GOAL #2   Title Patient to be able to maintain correct posture at least 75% of the time in order to assist in reducing pain and improving functional task performance    Time 10   Period Days   Status Achieved     PT SHORT TERM GOAL #3   Title Paitent to demonstrate full ROM in all directions of her lumbar spine with no increase in pain to show general improvement of condition    Baseline 10/9- improving    Time 10   Period Days   Status On-going     PT SHORT TERM GOAL #4   Title Patient to be independent in correctly and consistently performing appropriate HEP, to be updated PRN    Baseline 10/9- bridges, pelvic tiles, supine marches, pOE but has peripheralization    Time 10   Period Days   Status Achieved           PT Long Term Goals - 06/18/16 1704      PT LONG TERM GOAL #1   Title Patient to demonstrate functional strength 5/5 in all tested muscle groups in order to assist in reducing pain and facilitating return to PLOF    Baseline 10/9- improving except for proximal strength L    Time 3   Period Weeks   Status On-going     PT LONG TERM GOAL #2   Title Patient to  demonstrate correct functional  lifting mechanics in order to assist in preventing exacerbation of pain/reinjury of lumbar area    Time 3   Period Weeks   Status Achieved     PT LONG TERM GOAL #3   Title Patient to show normalized gait mechanics, including equal pelvic mobility, reduced rigidity of lumbar and thoracic spines, and pain with extended gait no more than 2/10 in order to improved tolerance to extended weight bearing activities    Baseline 10/9- improving    Time 3   Period Weeks   Status On-going     PT LONG TERM GOAL #4   Title Patient to report she has been able to return to her normal work based and recreational activities with pain no more than 2/10 in order to improve QOL    Baseline 10/9- pain still around 5/10; has not tried to do any tasks besides work    Time 3   Period Weeks   Status On-going               Plan - 06/25/16 1343    Clinical Impression Statement Patient arrives today confirming her precautions from MD and that the focus needs to be on stabilization at this point; focused on proximal strengthening today to patient tolerance and while attempting to remain within boundaries of exercise without causing muscle spasms. Also performed manual to low back musculature as well for pain relief this session. She states that she has been able to somewhat manage spasms via weight bearing when they happen, MD is not aware of these yet.    Rehab Potential Good   PT Frequency Other (comment)  as patient can schedule    PT Duration 4 weeks   PT Treatment/Interventions ADLs/Self Care Home Management;Biofeedback;Cryotherapy;Moist Heat;Gait training;Stair training;Functional mobility training;Therapeutic activities;Therapeutic exercise;Balance training;Neuromuscular re-education;Patient/family education;Manual techniques;Passive range of motion;Energy conservation;Taping   PT Next Visit Plan PER MD - NO FLEXION OR EXTENSION BASED EXERCISES. NEEDS TO STAY IN RELATIVE NEUTRAL.   Focus on core  stabilization with proximal strengthening,  Attempt tall kneeling due to unable to tolerate quadruped.   PT Home Exercise Plan 9/20: bridges, cautious lumbar rotations, 12-6 pelvic clocks; 10/9: DKTC, sciatic flossing; 11/3: Added rotational stretch in chair. 11/8 - removed all flexion based exercises from HEP, now doing bridges, POE, sciatic nerve flossing, and pelvic clocks with new handout given   Consulted and Agree with Plan of Care Patient      Patient will benefit from skilled therapeutic intervention in order to improve the following deficits and impairments:  Abnormal gait, Impaired sensation, Improper body mechanics, Pain, Decreased mobility, Postural dysfunction, Decreased activity tolerance, Decreased range of motion, Hypomobility, Difficulty walking  Visit Diagnosis: Muscle weakness (generalized)  Abnormal posture  Other symptoms and signs involving the musculoskeletal system     Problem List Patient Active Problem List   Diagnosis Date Noted  . Weakness generalized 11/15/2012  . Paresthesias 11/15/2012  . Pain in limb 11/15/2012  . Numbness and tingling 11/15/2012    Deniece Ree PT, DPT [place] [address] [place], [place], [address] Phone: [phone]   Fax:  [phone]  Name: DARYL DESLAURIERS MRN: [id number] Date of Birth: 10/19/1955

## 2016-07-10 DIAGNOSIS — G9059 Complex regional pain syndrome I of other specified site: Secondary | ICD-10-CM | POA: Diagnosis not present

## 2016-07-10 DIAGNOSIS — G9050 Complex regional pain syndrome I, unspecified: Secondary | ICD-10-CM | POA: Diagnosis not present

## 2016-07-10 DIAGNOSIS — I872 Venous insufficiency (chronic) (peripheral): Secondary | ICD-10-CM | POA: Diagnosis not present

## 2016-07-10 DIAGNOSIS — G894 Chronic pain syndrome: Secondary | ICD-10-CM | POA: Diagnosis not present

## 2016-07-10 DIAGNOSIS — M542 Cervicalgia: Secondary | ICD-10-CM | POA: Diagnosis not present

## 2016-07-10 DIAGNOSIS — J45909 Unspecified asthma, uncomplicated: Secondary | ICD-10-CM | POA: Diagnosis not present

## 2016-07-11 HISTORY — PX: OTHER SURGICAL HISTORY: SHX169

## 2016-07-15 MED FILL — METHOCARBAMOL 500 MG TABLET: 500 | 20 days supply | Qty: 60 | Fill #0

## 2016-07-16 ENCOUNTER — Ambulatory Visit (HOSPITAL_COMMUNITY): Payer: Self-pay

## 2016-07-18 ENCOUNTER — Other Ambulatory Visit: Payer: Self-pay | Admitting: Orthopedic Surgery

## 2016-07-18 DIAGNOSIS — M48061 Spinal stenosis, lumbar region without neurogenic claudication: Secondary | ICD-10-CM

## 2016-07-28 MED FILL — ATENOLOL 25 MG TABLET: 25 | 30 days supply | Qty: 30 | Fill #1

## 2016-08-01 ENCOUNTER — Ambulatory Visit
Admission: RE | Admit: 2016-08-01 | Discharge: 2016-08-01 | Disposition: A | Discharge status: Home / Self Care | Payer: Worker's Compensation | Source: Ambulatory Visit | Attending: Orthopedic Surgery | Admitting: Orthopedic Surgery

## 2016-08-01 DIAGNOSIS — M48061 Spinal stenosis, lumbar region without neurogenic claudication: Secondary | ICD-10-CM

## 2016-08-01 MED ORDER — IOPAMIDOL (ISOVUE-M 200) INJECTION 41%
1.0000 mL | Freq: Once | INTRAMUSCULAR | Status: AC
  Administered 2016-08-01: 1 mL via EPIDURAL

## 2016-08-01 MED ORDER — METHYLPREDNISOLONE ACETATE 40 MG/ML INJ SUSP (RADIOLOG
120.0000 mg | Freq: Once | INTRAMUSCULAR | Status: AC
  Administered 2016-08-01: 120 mg via EPIDURAL

## 2016-08-01 NOTE — Discharge Instructions (Signed)

## 2016-08-07 DIAGNOSIS — J45909 Unspecified asthma, uncomplicated: Secondary | ICD-10-CM | POA: Diagnosis not present

## 2016-08-07 DIAGNOSIS — G9059 Complex regional pain syndrome I of other specified site: Secondary | ICD-10-CM | POA: Diagnosis not present

## 2016-08-07 DIAGNOSIS — G894 Chronic pain syndrome: Secondary | ICD-10-CM | POA: Diagnosis not present

## 2016-08-07 DIAGNOSIS — G9050 Complex regional pain syndrome I, unspecified: Secondary | ICD-10-CM | POA: Diagnosis not present

## 2016-08-07 DIAGNOSIS — I10 Essential (primary) hypertension: Secondary | ICD-10-CM | POA: Diagnosis not present

## 2016-08-07 DIAGNOSIS — M546 Pain in thoracic spine: Secondary | ICD-10-CM | POA: Diagnosis not present

## 2016-08-08 DIAGNOSIS — G9050 Complex regional pain syndrome I, unspecified: Secondary | ICD-10-CM | POA: Diagnosis not present

## 2016-08-08 DIAGNOSIS — J45909 Unspecified asthma, uncomplicated: Secondary | ICD-10-CM | POA: Diagnosis not present

## 2016-08-08 DIAGNOSIS — I10 Essential (primary) hypertension: Secondary | ICD-10-CM | POA: Diagnosis not present

## 2016-08-21 ENCOUNTER — Other Ambulatory Visit: Payer: Self-pay | Admitting: Orthopedic Surgery

## 2016-08-26 MED FILL — ATENOLOL 25 MG TABLET: 25 | 30 days supply | Qty: 30 | Fill #2

## 2016-09-04 DIAGNOSIS — H2513 Age-related nuclear cataract, bilateral: Secondary | ICD-10-CM | POA: Diagnosis not present

## 2016-09-04 DIAGNOSIS — H43393 Other vitreous opacities, bilateral: Secondary | ICD-10-CM | POA: Diagnosis not present

## 2016-09-04 DIAGNOSIS — D3132 Benign neoplasm of left choroid: Secondary | ICD-10-CM | POA: Diagnosis not present

## 2016-09-05 ENCOUNTER — Encounter (HOSPITAL_COMMUNITY): Payer: Self-pay

## 2016-09-05 ENCOUNTER — Encounter (HOSPITAL_COMMUNITY)
Admission: RE | Admit: 2016-09-05 | Discharge: 2016-09-05 | Disposition: A | Discharge status: Home / Self Care | Payer: PRIVATE HEALTH INSURANCE | Source: Ambulatory Visit | Attending: Orthopedic Surgery | Admitting: Orthopedic Surgery

## 2016-09-05 ENCOUNTER — Ambulatory Visit (HOSPITAL_COMMUNITY)
Admission: RE | Admit: 2016-09-05 | Discharge: 2016-09-05 | Disposition: A | Discharge status: Home / Self Care | Payer: PRIVATE HEALTH INSURANCE | Source: Ambulatory Visit | Attending: Orthopedic Surgery | Admitting: Orthopedic Surgery

## 2016-09-05 DIAGNOSIS — Z01818 Encounter for other preprocedural examination: Secondary | ICD-10-CM | POA: Insufficient documentation

## 2016-09-05 DIAGNOSIS — Z01810 Encounter for preprocedural cardiovascular examination: Secondary | ICD-10-CM | POA: Insufficient documentation

## 2016-09-05 DIAGNOSIS — M5416 Radiculopathy, lumbar region: Secondary | ICD-10-CM | POA: Diagnosis not present

## 2016-09-05 LAB — COMPREHENSIVE METABOLIC PANEL
ALBUMIN: 3.7 g/dL (ref 3.5–5.0)
ALT: 34 U/L (ref 14–54)
AST: 26 U/L (ref 15–41)
Alkaline Phosphatase: 90 U/L (ref 38–126)
Anion gap: 6 (ref 5–15)
BUN: 10 mg/dL (ref 6–20)
CHLORIDE: 110 mmol/L (ref 101–111)
CO2: 28 mmol/L (ref 22–32)
CREATININE: 0.8 mg/dL (ref 0.44–1.00)
Calcium: 9.6 mg/dL (ref 8.9–10.3)
GFR calc Af Amer: >60 mL/min (ref >60)
GFR calc non Af Amer: >60 mL/min (ref >60)
GLUCOSE: 78 mg/dL (ref 65–99)
Potassium: 3.3 mmol/L — ABNORMAL LOW (ref 3.5–5.1)
SODIUM: 144 mmol/L (ref 135–145)
Total Bilirubin: 0.5 mg/dL (ref 0.3–1.2)
Total Protein: 6.7 g/dL (ref 6.5–8.1)

## 2016-09-05 LAB — CBC WITH DIFFERENTIAL/PLATELET
BASOS ABS: 0 10*3/uL (ref 0.0–0.1)
Basophils Relative: 1 %
EOS ABS: 0.1 10*3/uL (ref 0.0–0.7)
EOS PCT: 3 %
HCT: 41.4 % (ref 36.0–46.0)
Hemoglobin: 13.3 g/dL (ref 12.0–15.0)
LYMPHS PCT: 39 %
Lymphs Abs: 1.7 10*3/uL (ref 0.7–4.0)
MCH: 30.4 pg (ref 26.0–34.0)
MCHC: 32.1 g/dL (ref 30.0–36.0)
MCV: 94.7 fL (ref 78.0–100.0)
Monocytes Absolute: 0.4 10*3/uL (ref 0.1–1.0)
Monocytes Relative: 9 %
Neutro Abs: 2.1 10*3/uL (ref 1.7–7.7)
Neutrophils Relative %: 48 %
PLATELETS: 225 10*3/uL (ref 150–400)
RBC: 4.37 MIL/uL (ref 3.87–5.11)
RDW: 13.8 % (ref 11.5–15.5)
WBC: 4.3 10*3/uL (ref 4.0–10.5)

## 2016-09-05 LAB — TYPE AND SCREEN
ABO/RH(D): O POS
ANTIBODY SCREEN: NEGATIVE

## 2016-09-05 LAB — PROTIME-INR
INR: 1.01
Prothrombin Time: 13.3 seconds (ref 11.4–15.2)

## 2016-09-05 LAB — URINALYSIS, ROUTINE W REFLEX MICROSCOPIC
BACTERIA UA: NONE SEEN
BILIRUBIN URINE: NEGATIVE
Glucose, UA: NEGATIVE mg/dL
HGB URINE DIPSTICK: NEGATIVE
KETONES UR: NEGATIVE mg/dL
Nitrite: NEGATIVE
Protein, ur: NEGATIVE mg/dL
Specific Gravity, Urine: 1.009 (ref 1.005–1.030)
pH: 6 (ref 5.0–8.0)

## 2016-09-05 LAB — SURGICAL PCR SCREEN
MRSA, PCR: NEGATIVE
STAPHYLOCOCCUS AUREUS: NEGATIVE

## 2016-09-05 LAB — APTT: APTT: 31 s (ref 24–36)

## 2016-09-05 LAB — ABO/RH: ABO/RH(D): O POS

## 2016-09-05 NOTE — Pre-Procedure Instructions (Signed)
    Melissa Blackburn  09/05/2016      [place], [place] - [address] [facility]. [address] [place] [address] Phone: [phone] Fax: [phone]    Your procedure is scheduled on 09-11-2016     Thursday   Report to [facility] Admitting at  12:25  PM   Call this number if you have problems the morning of surgery:  [phone]   Remember:  Do not eat food or drink liquids after midnight.   Take these medicines the morning of surgery with A SIP OF WATER methocarbamol(Robaxin),if needed   STOP ASPIRIN,ANTIINFLAMATORIES (IBUPROFEN,ALEVE,MOTRIN,ADVIL,GOODY'S POWDERS),HERBAL SUPPLEMENTS,FISH OIL,AND VITAMINS 5-7 DAYS PRIOR TO SURGERY   Do not wear jewelry, make-up or nail polish.  Do not wear lotions, powders, or perfumes, or deoderant.  Do not shave 48 hours prior to surgery.  .  Do not bring valuables to the hospital.  [facility] is not responsible for any belongings or valuables.  Contacts, dentures or bridgework may not be worn into surgery.  Leave your suitcase in the car.  After surgery it may be brought to your room.  For patients admitted to the hospital, discharge time will be determined by your treatment team.  Patients discharged the day of surgery will not be allowed to drive home.    Special instructions:  See attached Sheet for instructions on CHG showers  Please read over the following fact sheets that you were given. Incentive Spirometry

## 2016-09-10 NOTE — H&P (Signed)
PREOPERATIVE H&P  Chief Complaint: Bilateral leg pain  HPI: Melissa Blackburn is a 61 y.o. female who presents with ongoing pain in the left leg  MRI reveals stenosis at L4/5 and x-rays reveal a grade 1 spondy at L4/5  Patient has failed multiple forms of conservative care and continues to have pain (see office notes for additional details regarding the patient's full course of treatment)  Past Medical History:  Diagnosis Date  . Anemia    hx of  . Asthma    as a child  . Complication of anesthesia   . DDD (degenerative disc disease)   . Degenerative disc disease   . Hypertension   . Migraine   . Migraine   . Needle stick injury with contaminated needle    patient undergoing testing  . Numbness and tingling 11/15/2012  . Pain in limb 11/15/2012  . Paresthesias 11/15/2012  . Pneumonia    hx of  . PONV (postoperative nausea and vomiting)    "severe nausea and vomiting"  . Sleep apnea    cpap 7  . Weakness generalized 11/15/2012   Past Surgical History:  Procedure Laterality Date  . ABDOMINAL HYSTERECTOMY    . ESOPHAGOGASTRODUODENOSCOPY (EGD) WITH PROPOFOL N/A 01/10/2016   Procedure: ESOPHAGOGASTRODUODENOSCOPY (EGD) WITH PROPOFOL;  Surgeon: Milus Banister, MD;  Location: [facility];  Service: Endoscopy;  Laterality: N/A;  . KNEE ARTHROSCOPY  04/23/2012   Procedure: ARTHROSCOPY KNEE;  Surgeon: Augustin Schooling, MD;  Location: [facility];  Service: Orthopedics;  Laterality: Left;  lateral meniscectomy  . KNEE SURGERY     x 4  . RECTOCELE REPAIR    . SHOULDER SURGERY     x 5  . SPINAL CORD STIMULATOR IMPLANT Right   . UPPER ESOPHAGEAL ENDOSCOPIC ULTRASOUND (EUS)  01/10/2016   Procedure: UPPER ESOPHAGEAL ENDOSCOPIC ULTRASOUND (EUS);  Surgeon: Milus Banister, MD;  Location: [facility];  Service: Endoscopy;;   Social History   Social History  . Marital status: Married    Spouse name: N/A  . Number of children: 2  . Years of education: N/A   Occupational History  . Nurse  [employer]   Social History Main Topics  . Smoking status: Never Smoker  . Smokeless tobacco: Never Used  . Alcohol use No  . Drug use: No  . Sexual activity: Not on file   Other Topics Concern  . Not on file   Social History Narrative   ** Merged History Encounter **       Family History  Problem Relation Age of Onset  . Cancer Mother     Breast cancer  . Hypertension Father   . COPD Father   . Coronary artery disease Father   . Cancer Maternal Grandmother     Bone cancer  . Coronary artery disease Maternal Grandfather    Allergies  Allergen Reactions  . Verapamil Other (See Comments)    Heart block per patient  . Avelox [Moxifloxacin Hcl In Nacl] Diarrhea  . Flexeril [Cyclobenzaprine Hcl] Other (See Comments)    Systolic Levels "bottoms out"  . Imitrex [Sumatriptan] Other (See Comments)    "Messes with my blood pressure - will cause it to go up and then down"   . Midazolam Hcl Nausea And Vomiting  . Neurontin [Gabapentin] Other (See Comments)    Muscle spasms  . Pregabalin Other (See Comments)    Muscle spasms  . Tape Other (See Comments)    Blisters  skin   Prior to Admission medications   Medication Sig Start Date End Date Taking? Authorizing Provider  amitriptyline (ELAVIL) 50 MG tablet Take 50 mg by mouth at bedtime. For migraines.   Yes Historical Provider, MD  atenolol (TENORMIN) 25 MG tablet Take 25 mg by mouth at bedtime.  12/18/15  Yes Historical Provider, MD  docusate sodium (COLACE) 100 MG capsule Take 200 mg by mouth 2 (two) times daily as needed for mild constipation.   Yes Historical Provider, MD  ibuprofen (ADVIL,MOTRIN) 200 MG tablet Take 800 mg by mouth every 6 (six) hours as needed (For pain.).   Yes Historical Provider, MD  methocarbamol (ROBAXIN) 500 MG tablet Take 500 mg by mouth 3 (three) times daily as needed for muscle spasms. 07/15/16  Yes Historical Provider, MD  promethazine (PHENERGAN) 25 MG tablet Take 25 mg by mouth every 6  (six) hours as needed for nausea or vomiting.   Yes Historical Provider, MD     All other systems have been reviewed and were otherwise negative with the exception of those mentioned in the HPI and as above.  Physical Exam: There were no vitals filed for this visit.  General: Alert, no acute distress Cardiovascular: No pedal edema Respiratory: No cyanosis, no use of accessory musculature Skin: No lesions in the area of chief complaint Neurologic: Sensation intact distally Psychiatric: Patient is competent for consent with normal mood and affect Lymphatic: No axillary or cervical lymphadenopathy  Assessment/Plan: Left-sided LumbarRadiculopathy Plan for Procedure(s): LEFT SIDED LUMBAR 4-5 TRANSFORAMINAL LUMBAR INTERBODY FUSION WITH INSTRUMENTATION AND ALLOGRAFT   Sinclair Ship, MD 09/10/2016 1:51 PM

## 2016-09-11 ENCOUNTER — Inpatient Hospital Stay (HOSPITAL_COMMUNITY)
Admission: RE | Admit: 2016-09-11 | Discharge: 2016-09-12 | DRG: 455 | Disposition: A | Discharge status: Home / Self Care | Payer: PRIVATE HEALTH INSURANCE | Source: Ambulatory Visit | Attending: Orthopedic Surgery | Admitting: Orthopedic Surgery

## 2016-09-11 ENCOUNTER — Encounter (HOSPITAL_COMMUNITY)
Admission: RE | Disposition: A | Discharge status: Home / Self Care | Payer: Self-pay | Source: Ambulatory Visit | Attending: Orthopedic Surgery

## 2016-09-11 ENCOUNTER — Inpatient Hospital Stay (HOSPITAL_COMMUNITY): Payer: PRIVATE HEALTH INSURANCE | Admitting: Certified Registered Nurse Anesthetist

## 2016-09-11 ENCOUNTER — Inpatient Hospital Stay (HOSPITAL_COMMUNITY): Payer: PRIVATE HEALTH INSURANCE

## 2016-09-11 ENCOUNTER — Encounter (HOSPITAL_COMMUNITY): Payer: Self-pay | Admitting: Certified Registered Nurse Anesthetist

## 2016-09-11 DIAGNOSIS — J45909 Unspecified asthma, uncomplicated: Secondary | ICD-10-CM | POA: Diagnosis present

## 2016-09-11 DIAGNOSIS — I10 Essential (primary) hypertension: Secondary | ICD-10-CM | POA: Diagnosis present

## 2016-09-11 DIAGNOSIS — Z419 Encounter for procedure for purposes other than remedying health state, unspecified: Secondary | ICD-10-CM

## 2016-09-11 DIAGNOSIS — M48061 Spinal stenosis, lumbar region without neurogenic claudication: Secondary | ICD-10-CM | POA: Diagnosis present

## 2016-09-11 DIAGNOSIS — M4316 Spondylolisthesis, lumbar region: Secondary | ICD-10-CM | POA: Diagnosis present

## 2016-09-11 DIAGNOSIS — Z79899 Other long term (current) drug therapy: Secondary | ICD-10-CM

## 2016-09-11 DIAGNOSIS — G4733 Obstructive sleep apnea (adult) (pediatric): Secondary | ICD-10-CM | POA: Diagnosis present

## 2016-09-11 DIAGNOSIS — Z888 Allergy status to other drugs, medicaments and biological substances status: Secondary | ICD-10-CM | POA: Diagnosis not present

## 2016-09-11 DIAGNOSIS — M5410 Radiculopathy, site unspecified: Secondary | ICD-10-CM | POA: Diagnosis present

## 2016-09-11 DIAGNOSIS — Z01818 Encounter for other preprocedural examination: Secondary | ICD-10-CM

## 2016-09-11 HISTORY — PX: LUMBAR FUSION: SHX111

## 2016-09-11 HISTORY — DX: Obstructive sleep apnea (adult) (pediatric): G47.33

## 2016-09-11 HISTORY — DX: Dependence on other enabling machines and devices: Z99.89

## 2016-09-11 LAB — GLUCOSE, CAPILLARY
GLUCOSE-CAPILLARY: 81 mg/dL (ref 65–99)
GLUCOSE-CAPILLARY: 118 mg/dL — AB (ref 65–99)
Glucose-Capillary: 67 mg/dL (ref 65–99)

## 2016-09-11 SURGERY — POSTERIOR LUMBAR FUSION 1 LEVEL
Anesthesia: General | Site: Spine Lumbar | Laterality: Left

## 2016-09-11 MED ORDER — LIDOCAINE 2% (20 MG/ML) 5 ML SYRINGE
INTRAMUSCULAR | Status: AC
  Filled 2016-09-11: qty 5

## 2016-09-11 MED ORDER — HYDROMORPHONE HCL 1 MG/ML IJ SOLN
INTRAMUSCULAR | Status: AC
  Filled 2016-09-11: qty 1

## 2016-09-11 MED ORDER — ROCURONIUM BROMIDE 50 MG/5ML IV SOSY
PREFILLED_SYRINGE | INTRAVENOUS | Status: AC
  Filled 2016-09-11: qty 5

## 2016-09-11 MED ORDER — DIPHENHYDRAMINE HCL 50 MG/ML IJ SOLN
INTRAMUSCULAR | Status: DC | PRN
  Administered 2016-09-11: 25 mg via INTRAVENOUS

## 2016-09-11 MED ORDER — THROMBIN 20000 UNITS EX KIT
PACK | CUTANEOUS | Status: DC | PRN
  Administered 2016-09-11: 20000 [IU] via TOPICAL

## 2016-09-11 MED ORDER — 0.9 % SODIUM CHLORIDE (POUR BTL) OPTIME
TOPICAL | Status: DC | PRN
  Administered 2016-09-11: 1000 mL

## 2016-09-11 MED ORDER — SCOPOLAMINE 1 MG/3DAYS TD PT72
MEDICATED_PATCH | TRANSDERMAL | Status: AC
  Filled 2016-09-11: qty 1

## 2016-09-11 MED ORDER — PROPOFOL 10 MG/ML IV BOLUS
INTRAVENOUS | Status: AC
  Filled 2016-09-11: qty 20

## 2016-09-11 MED ORDER — ROCURONIUM BROMIDE 100 MG/10ML IV SOLN
INTRAVENOUS | Status: DC | PRN
  Administered 2016-09-11: 30 mg via INTRAVENOUS
  Administered 2016-09-11: 50 mg via INTRAVENOUS

## 2016-09-11 MED ORDER — MENTHOL 3 MG MT LOZG: 1.0000 | LOZENGE | OROMUCOSAL | Status: DC | PRN

## 2016-09-11 MED ORDER — LIDOCAINE HCL (CARDIAC) 20 MG/ML IV SOLN
INTRAVENOUS | Status: DC | PRN
  Administered 2016-09-11: 60 mg via INTRATRACHEAL

## 2016-09-11 MED ORDER — BUPIVACAINE HCL (PF) 0.25 % IJ SOLN
INTRAMUSCULAR | Status: AC
  Filled 2016-09-11: qty 30

## 2016-09-11 MED ORDER — ACETAMINOPHEN 650 MG RE SUPP: 650.0000 mg | RECTAL | Status: DC | PRN

## 2016-09-11 MED ORDER — POTASSIUM CHLORIDE IN NACL 20-0.9 MEQ/L-% IV SOLN
INTRAVENOUS | Status: DC
  Administered 2016-09-11: 22:00:00 via INTRAVENOUS
  Filled 2016-09-11: qty 1000

## 2016-09-11 MED ORDER — DOCUSATE SODIUM 100 MG PO CAPS
100.0000 mg | ORAL_CAPSULE | Freq: Two times a day (BID) | ORAL | Status: DC
  Administered 2016-09-11 – 2016-09-12 (×2): 100 mg via ORAL
  Filled 2016-09-11 (×2): qty 1

## 2016-09-11 MED ORDER — PHENYLEPHRINE HCL 10 MG/ML IJ SOLN
INTRAMUSCULAR | Status: DC | PRN
  Administered 2016-09-11: 25 ug/min via INTRAVENOUS

## 2016-09-11 MED ORDER — FLEET ENEMA 7-19 GM/118ML RE ENEM: 1.0000 | ENEMA | Freq: Once | RECTAL | Status: DC | PRN

## 2016-09-11 MED ORDER — FENTANYL CITRATE (PF) 100 MCG/2ML IJ SOLN
INTRAMUSCULAR | Status: AC
  Filled 2016-09-11: qty 2

## 2016-09-11 MED ORDER — CEFAZOLIN SODIUM-DEXTROSE 2-4 GM/100ML-% IV SOLN
2.0000 g | INTRAVENOUS | Status: AC
  Administered 2016-09-11: 2 g via INTRAVENOUS

## 2016-09-11 MED ORDER — MORPHINE SULFATE (PF) 2 MG/ML IV SOLN: 1.0000 mg | INTRAVENOUS | Status: DC | PRN

## 2016-09-11 MED ORDER — DOCUSATE SODIUM 100 MG PO CAPS: 200.0000 mg | ORAL_CAPSULE | Freq: Two times a day (BID) | ORAL | Status: DC | PRN

## 2016-09-11 MED ORDER — METHOCARBAMOL 500 MG PO TABS
500.0000 mg | ORAL_TABLET | Freq: Three times a day (TID) | ORAL | Status: DC | PRN
  Administered 2016-09-11 – 2016-09-12 (×2): 500 mg via ORAL
  Filled 2016-09-11 (×2): qty 1

## 2016-09-11 MED ORDER — SODIUM CHLORIDE 0.9% FLUSH: 3.0000 mL | INTRAVENOUS | Status: DC | PRN

## 2016-09-11 MED ORDER — GLYCOPYRROLATE 0.2 MG/ML IJ SOLN
INTRAMUSCULAR | Status: DC | PRN
  Administered 2016-09-11 (×2): 0.1 mg via INTRAVENOUS

## 2016-09-11 MED ORDER — LACTATED RINGERS IV SOLN
INTRAVENOUS | Status: DC
  Administered 2016-09-11 (×3): via INTRAVENOUS

## 2016-09-11 MED ORDER — METHYLENE BLUE 0.5 % INJ SOLN
INTRAVENOUS | Status: DC | PRN
  Administered 2016-09-11: .2 mL via INTRADERMAL

## 2016-09-11 MED ORDER — DEXTROSE 50 % IV SOLN
25.0000 mL | Freq: Once | INTRAVENOUS | Status: AC
  Administered 2016-09-11: 25 mL via INTRAVENOUS
  Filled 2016-09-11: qty 50

## 2016-09-11 MED ORDER — MINERAL OIL LIGHT 100 % EX OIL
TOPICAL_OIL | CUTANEOUS | Status: DC | PRN
  Administered 2016-09-11: 1 via TOPICAL

## 2016-09-11 MED ORDER — ATENOLOL 25 MG PO TABS
25.0000 mg | ORAL_TABLET | Freq: Every day | ORAL | Status: DC
  Administered 2016-09-11: 25 mg via ORAL
  Filled 2016-09-11: qty 1

## 2016-09-11 MED ORDER — SENNOSIDES-DOCUSATE SODIUM 8.6-50 MG PO TABS: 1.0000 | ORAL_TABLET | Freq: Every evening | ORAL | Status: DC | PRN

## 2016-09-11 MED ORDER — ONDANSETRON HCL 4 MG/2ML IJ SOLN
INTRAMUSCULAR | Status: AC
  Filled 2016-09-11: qty 2

## 2016-09-11 MED ORDER — OXYCODONE-ACETAMINOPHEN 5-325 MG PO TABS
1.0000 | ORAL_TABLET | ORAL | Status: DC | PRN
  Administered 2016-09-11: 2 via ORAL
  Administered 2016-09-12: 1 via ORAL
  Administered 2016-09-12: 2 via ORAL
  Filled 2016-09-11: qty 1
  Filled 2016-09-11 (×3): qty 2

## 2016-09-11 MED ORDER — DIPHENHYDRAMINE HCL 50 MG/ML IJ SOLN
INTRAMUSCULAR | Status: AC
  Filled 2016-09-11: qty 1

## 2016-09-11 MED ORDER — SUGAMMADEX SODIUM 200 MG/2ML IV SOLN
INTRAVENOUS | Status: AC
  Filled 2016-09-11: qty 2

## 2016-09-11 MED ORDER — PROPOFOL 10 MG/ML IV BOLUS
INTRAVENOUS | Status: DC | PRN
  Administered 2016-09-11: 200 mg via INTRAVENOUS

## 2016-09-11 MED ORDER — FENTANYL CITRATE (PF) 100 MCG/2ML IJ SOLN
INTRAMUSCULAR | Status: DC | PRN
  Administered 2016-09-11: 25 ug via INTRAVENOUS
  Administered 2016-09-11: 100 ug via INTRAVENOUS
  Administered 2016-09-11: 75 ug via INTRAVENOUS
  Administered 2016-09-11: 100 ug via INTRAVENOUS

## 2016-09-11 MED ORDER — BISACODYL 5 MG PO TBEC: 5.0000 mg | DELAYED_RELEASE_TABLET | Freq: Every day | ORAL | Status: DC | PRN

## 2016-09-11 MED ORDER — HYDROCODONE-ACETAMINOPHEN 5-325 MG PO TABS
ORAL_TABLET | ORAL | Status: AC
  Filled 2016-09-11: qty 1

## 2016-09-11 MED ORDER — HYDROCODONE-ACETAMINOPHEN 5-325 MG PO TABS
1.0000 | ORAL_TABLET | ORAL | Status: DC | PRN
  Administered 2016-09-11: 1 via ORAL
  Administered 2016-09-12: 2 via ORAL
  Filled 2016-09-11: qty 2

## 2016-09-11 MED ORDER — ZOLPIDEM TARTRATE 5 MG PO TABS: 5.0000 mg | ORAL_TABLET | Freq: Every evening | ORAL | Status: DC | PRN

## 2016-09-11 MED ORDER — BUPIVACAINE LIPOSOME 1.3 % IJ SUSP
20.0000 mL | Freq: Once | INTRAMUSCULAR | Status: DC
  Filled 2016-09-11: qty 20

## 2016-09-11 MED ORDER — THROMBIN 20000 UNITS EX SOLR
CUTANEOUS | Status: AC
  Filled 2016-09-11: qty 20000

## 2016-09-11 MED ORDER — ARTIFICIAL TEARS OP OINT
TOPICAL_OINTMENT | OPHTHALMIC | Status: DC | PRN
  Administered 2016-09-11: 1 via OPHTHALMIC

## 2016-09-11 MED ORDER — DIAZEPAM 5 MG PO TABS
5.0000 mg | ORAL_TABLET | Freq: Four times a day (QID) | ORAL | Status: DC | PRN
  Administered 2016-09-11: 5 mg via ORAL

## 2016-09-11 MED ORDER — POVIDONE-IODINE 7.5 % EX SOLN: Freq: Once | CUTANEOUS | Status: DC

## 2016-09-11 MED ORDER — SODIUM CHLORIDE 0.9 % IV SOLN: 250.0000 mL | INTRAVENOUS | Status: DC

## 2016-09-11 MED ORDER — DEXAMETHASONE SODIUM PHOSPHATE 10 MG/ML IJ SOLN
INTRAMUSCULAR | Status: DC | PRN
  Administered 2016-09-11: 10 mg via INTRAVENOUS

## 2016-09-11 MED ORDER — PROMETHAZINE HCL 25 MG PO TABS: 25.0000 mg | ORAL_TABLET | Freq: Four times a day (QID) | ORAL | Status: DC | PRN

## 2016-09-11 MED ORDER — PROPOFOL 500 MG/50ML IV EMUL
INTRAVENOUS | Status: DC | PRN
  Administered 2016-09-11: 50 ug/kg/min via INTRAVENOUS

## 2016-09-11 MED ORDER — SUGAMMADEX SODIUM 200 MG/2ML IV SOLN
INTRAVENOUS | Status: DC | PRN
  Administered 2016-09-11: 173.2 mg via INTRAVENOUS

## 2016-09-11 MED ORDER — ONDANSETRON HCL 4 MG/2ML IJ SOLN: 4.0000 mg | INTRAMUSCULAR | Status: DC | PRN

## 2016-09-11 MED ORDER — ARTIFICIAL TEARS OP OINT
TOPICAL_OINTMENT | OPHTHALMIC | Status: AC
  Filled 2016-09-11: qty 3.5

## 2016-09-11 MED ORDER — DEXTROSE 50 % IV SOLN
INTRAVENOUS | Status: AC
  Filled 2016-09-11: qty 50

## 2016-09-11 MED ORDER — PHENOL 1.4 % MT LIQD
1.0000 | OROMUCOSAL | Status: DC | PRN
Start: 2016-09-11 — End: 2016-09-12

## 2016-09-11 MED ORDER — ONDANSETRON HCL 4 MG/2ML IJ SOLN
INTRAMUSCULAR | Status: DC | PRN
  Administered 2016-09-11 (×2): 4 mg via INTRAVENOUS

## 2016-09-11 MED ORDER — DIAZEPAM 5 MG PO TABS
ORAL_TABLET | ORAL | Status: AC
  Filled 2016-09-11: qty 1

## 2016-09-11 MED ORDER — CEFAZOLIN SODIUM-DEXTROSE 2-4 GM/100ML-% IV SOLN
2.0000 g | Freq: Three times a day (TID) | INTRAVENOUS | Status: AC
  Administered 2016-09-11 – 2016-09-12 (×2): 2 g via INTRAVENOUS
  Filled 2016-09-11 (×3): qty 100

## 2016-09-11 MED ORDER — HYDROMORPHONE HCL 1 MG/ML IJ SOLN
0.2500 mg | INTRAMUSCULAR | Status: DC | PRN
  Administered 2016-09-11 (×4): 0.5 mg via INTRAVENOUS

## 2016-09-11 MED ORDER — EPINEPHRINE PF 1 MG/ML IJ SOLN
INTRAMUSCULAR | Status: AC
  Filled 2016-09-11: qty 1

## 2016-09-11 MED ORDER — PROPOFOL 1000 MG/100ML IV EMUL
INTRAVENOUS | Status: AC
  Filled 2016-09-11: qty 100

## 2016-09-11 MED ORDER — ALUM & MAG HYDROXIDE-SIMETH 200-200-20 MG/5ML PO SUSP: 30.0000 mL | Freq: Four times a day (QID) | ORAL | Status: DC | PRN

## 2016-09-11 MED ORDER — ACETAMINOPHEN 325 MG PO TABS: 650.0000 mg | ORAL_TABLET | ORAL | Status: DC | PRN

## 2016-09-11 MED ORDER — MINERAL OIL LIGHT 100 % EX OIL
TOPICAL_OIL | CUTANEOUS | Status: AC
  Filled 2016-09-11: qty 25

## 2016-09-11 MED ORDER — AMITRIPTYLINE HCL 25 MG PO TABS
50.0000 mg | ORAL_TABLET | Freq: Every day | ORAL | Status: DC
  Administered 2016-09-11: 50 mg via ORAL
  Filled 2016-09-11: qty 2

## 2016-09-11 MED ORDER — SODIUM CHLORIDE 0.9% FLUSH: 3.0000 mL | Freq: Two times a day (BID) | INTRAVENOUS | Status: DC

## 2016-09-11 MED ORDER — SCOPOLAMINE 1 MG/3DAYS TD PT72
1.0000 | MEDICATED_PATCH | TRANSDERMAL | Status: DC
  Administered 2016-09-11: 1.5 mg via TRANSDERMAL
  Filled 2016-09-11: qty 1

## 2016-09-11 MED ORDER — METHYLENE BLUE 0.5 % INJ SOLN
INTRAVENOUS | Status: AC
  Filled 2016-09-11: qty 10

## 2016-09-11 SURGICAL SUPPLY — 91 items
BENZOIN TINCTURE PRP APPL 2/3 (GAUZE/BANDAGES/DRESSINGS) ×3 IMPLANT
BIT DRILL 3.2 (BIT) ×2
BIT DRILL 65X3.2XQC STP NS (BIT) ×1 IMPLANT
BIT DRL 65X3.2XQC STP NS (BIT) ×1
BLADE SURG ROTATE 9660 (MISCELLANEOUS) IMPLANT
BONE VIVIGEN FORMABLE 10CC (Bone Implant) ×3 IMPLANT
BUR PRESCISION 1.7 ELITE (BURR) ×3 IMPLANT
BUR ROUND PRECISION 4.0 (BURR) IMPLANT
BUR ROUND PRECISION 4.0MM (BURR)
BUR SABER RD CUTTING 3.0 (BURR) IMPLANT
BUR SABER RD CUTTING 3.0MM (BURR)
CAGE CONCORDE BULLET 9X11X27 (Cage) ×1 IMPLANT
CAGE CONCORDE BULLET 9X11X27MM (Cage) ×1 IMPLANT
CAGE SPNL 5D BLT NOSE 27X9X11 (Cage) ×1 IMPLANT
CARTRIDGE OIL MAESTRO DRILL (MISCELLANEOUS) ×1 IMPLANT
CLOSURE STERI-STRIP 1/2X4 (GAUZE/BANDAGES/DRESSINGS) ×1
CLOSURE WOUND 1/2 X4 (GAUZE/BANDAGES/DRESSINGS) ×2
CLSR STERI-STRIP ANTIMIC 1/2X4 (GAUZE/BANDAGES/DRESSINGS) ×2 IMPLANT
CONT SPEC STER OR (MISCELLANEOUS) ×3 IMPLANT
COVER MAYO STAND STRL (DRAPES) ×6 IMPLANT
COVER SURGICAL LIGHT HANDLE (MISCELLANEOUS) ×3 IMPLANT
DIFFUSER DRILL AIR PNEUMATIC (MISCELLANEOUS) ×3 IMPLANT
DRAIN CHANNEL 15F RND FF W/TCR (WOUND CARE) IMPLANT
DRAPE C-ARM 42X72 X-RAY (DRAPES) ×3 IMPLANT
DRAPE C-ARMOR (DRAPES) IMPLANT
DRAPE POUCH INSTRU U-SHP 10X18 (DRAPES) ×3 IMPLANT
DRAPE SURG 17X23 STRL (DRAPES) ×12 IMPLANT
DURAPREP 26ML APPLICATOR (WOUND CARE) ×3 IMPLANT
ELECT BLADE 4.0 EZ CLEAN MEGAD (MISCELLANEOUS) ×3
ELECT CAUTERY BLADE 6.4 (BLADE) ×3 IMPLANT
ELECT REM PT RETURN 9FT ADLT (ELECTROSURGICAL) ×3
ELECTRODE BLDE 4.0 EZ CLN MEGD (MISCELLANEOUS) ×1 IMPLANT
ELECTRODE REM PT RTRN 9FT ADLT (ELECTROSURGICAL) ×1 IMPLANT
EVACUATOR SILICONE 100CC (DRAIN) IMPLANT
FEE INTRAOP MONITOR IMPULS NCS (MISCELLANEOUS) ×1 IMPLANT
GAUZE SPONGE 4X4 12PLY STRL (GAUZE/BANDAGES/DRESSINGS) ×3 IMPLANT
GAUZE SPONGE 4X4 16PLY XRAY LF (GAUZE/BANDAGES/DRESSINGS) ×3 IMPLANT
GLOVE BIO SURGEON STRL SZ7 (GLOVE) ×3 IMPLANT
GLOVE BIO SURGEON STRL SZ8 (GLOVE) ×3 IMPLANT
GLOVE BIOGEL PI IND STRL 7.0 (GLOVE) ×1 IMPLANT
GLOVE BIOGEL PI IND STRL 8 (GLOVE) ×1 IMPLANT
GLOVE BIOGEL PI INDICATOR 7.0 (GLOVE) ×2
GLOVE BIOGEL PI INDICATOR 8 (GLOVE) ×2
GOWN STRL REUS W/ TWL LRG LVL3 (GOWN DISPOSABLE) ×2 IMPLANT
GOWN STRL REUS W/ TWL XL LVL3 (GOWN DISPOSABLE) ×1 IMPLANT
GOWN STRL REUS W/TWL LRG LVL3 (GOWN DISPOSABLE) ×4
GOWN STRL REUS W/TWL XL LVL3 (GOWN DISPOSABLE) ×2
GRAFT BNE MATRIX VG FRMBL L 10 (Bone Implant) ×1 IMPLANT
INTRAOP MONITOR FEE IMPULS NCS (MISCELLANEOUS) ×1
INTRAOP MONITOR FEE IMPULSE (MISCELLANEOUS) ×2
IV CATH 14GX2 1/4 (CATHETERS) ×3 IMPLANT
KIT BASIN OR (CUSTOM PROCEDURE TRAY) ×3 IMPLANT
KIT POSITION SURG JACKSON T1 (MISCELLANEOUS) ×3 IMPLANT
KIT ROOM TURNOVER OR (KITS) ×3 IMPLANT
MARKER SKIN DUAL TIP RULER LAB (MISCELLANEOUS) ×3 IMPLANT
NDL SAFETY ECLIPSE 18X1.5 (NEEDLE) ×1 IMPLANT
NEEDLE 22X1 1/2 (OR ONLY) (NEEDLE) ×3 IMPLANT
NEEDLE HYPO 18GX1.5 SHARP (NEEDLE) ×2
NEEDLE HYPO 25GX1X1/2 BEV (NEEDLE) ×3 IMPLANT
NEEDLE SPNL 18GX3.5 QUINCKE PK (NEEDLE) ×6 IMPLANT
NS IRRIG 1000ML POUR BTL (IV SOLUTION) ×3 IMPLANT
OIL CARTRIDGE MAESTRO DRILL (MISCELLANEOUS) ×3
PACK LAMINECTOMY ORTHO (CUSTOM PROCEDURE TRAY) ×3 IMPLANT
PACK UNIVERSAL I (CUSTOM PROCEDURE TRAY) ×3 IMPLANT
PAD ARMBOARD 7.5X6 YLW CONV (MISCELLANEOUS) ×6 IMPLANT
PATTIES SURGICAL .5 X1 (DISPOSABLE) ×3 IMPLANT
PATTIES SURGICAL .5X1.5 (GAUZE/BANDAGES/DRESSINGS) ×3 IMPLANT
PROBE PEDCLE PROBE MAGSTM DISP (MISCELLANEOUS) ×3 IMPLANT
ROD PRE BENT EXP 40MM (Rod) ×3 IMPLANT
ROD PRE LORDOSED 5.5X45 (Rod) ×3 IMPLANT
SCREW SET SINGLE INNER (Screw) ×12 IMPLANT
SCREW VIPER CORT FIX 6X35 (Screw) ×12 IMPLANT
SPONGE INTESTINAL PEANUT (DISPOSABLE) ×3 IMPLANT
SPONGE SURGIFOAM ABS GEL 100 (HEMOSTASIS) ×3 IMPLANT
STRIP CLOSURE SKIN 1/2X4 (GAUZE/BANDAGES/DRESSINGS) ×4 IMPLANT
SURGIFLO W/THROMBIN 8M KIT (HEMOSTASIS) IMPLANT
SUT MNCRL AB 4-0 PS2 18 (SUTURE) ×3 IMPLANT
SUT VIC AB 0 CT1 18XCR BRD 8 (SUTURE) ×1 IMPLANT
SUT VIC AB 0 CT1 8-18 (SUTURE) ×2
SUT VIC AB 1 CT1 18XCR BRD 8 (SUTURE) ×1 IMPLANT
SUT VIC AB 1 CT1 8-18 (SUTURE) ×2
SUT VIC AB 2-0 CT2 18 VCP726D (SUTURE) ×3 IMPLANT
SYR 20CC LL (SYRINGE) ×3 IMPLANT
SYR BULB IRRIGATION 50ML (SYRINGE) ×3 IMPLANT
SYR CONTROL 10ML LL (SYRINGE) ×6 IMPLANT
SYR TB 1ML LUER SLIP (SYRINGE) ×3 IMPLANT
TOWEL OR 17X24 6PK STRL BLUE (TOWEL DISPOSABLE) ×3 IMPLANT
TOWEL OR 17X26 10 PK STRL BLUE (TOWEL DISPOSABLE) ×3 IMPLANT
TRAY FOLEY CATH 16FRSI W/METER (SET/KITS/TRAYS/PACK) ×3 IMPLANT
WATER STERILE IRR 1000ML POUR (IV SOLUTION) ×3 IMPLANT
YANKAUER SUCT BULB TIP NO VENT (SUCTIONS) ×3 IMPLANT

## 2016-09-11 NOTE — Anesthesia Preprocedure Evaluation (Addendum)
Anesthesia Evaluation  Patient identified by MRN, date of birth, ID band Patient awake    Reviewed: Allergy & Precautions, H&P , NPO status , Patient's Chart, lab work & pertinent test results, reviewed documented beta blocker date and time   History of Anesthesia Complications (+) PONV  Airway Mallampati: II  TM Distance: >3 FB Neck ROM: Full    Dental no notable dental hx. (+) Teeth Intact, Dental Advisory Given   Pulmonary asthma , sleep apnea and Continuous Positive Airway Pressure Ventilation ,    Pulmonary exam normal breath sounds clear to auscultation       Cardiovascular hypertension, Pt. on medications and Pt. on home beta blockers negative cardio ROS   Rhythm:Regular Rate:Normal     Neuro/Psych  Headaches, negative psych ROS   GI/Hepatic negative GI ROS, Neg liver ROS,   Endo/Other  negative endocrine ROS  Renal/GU negative Renal ROS  negative genitourinary   Musculoskeletal  (+) Arthritis , Osteoarthritis,    Abdominal   Peds  Hematology negative hematology ROS (+) anemia ,   Anesthesia Other Findings   Reproductive/Obstetrics negative OB ROS                           Anesthesia Physical Anesthesia Plan  ASA: III  Anesthesia Plan: General   Post-op Pain Management:    Induction: Intravenous  Airway Management Planned: Oral ETT  Additional Equipment:   Intra-op Plan:   Post-operative Plan: Extubation in OR  Informed Consent: I have reviewed the patients History and Physical, chart, labs and discussed the procedure including the risks, benefits and alternatives for the proposed anesthesia with the patient or authorized representative who has indicated his/her understanding and acceptance.   Dental advisory given  Plan Discussed with: CRNA  Anesthesia Plan Comments:         Anesthesia Quick Evaluation

## 2016-09-11 NOTE — Transfer of Care (Signed)
Immediate Anesthesia Transfer of Care Note  Patient: Melissa Blackburn  Procedure(s) Performed: Procedure(s) with comments: LEFT SIDED LUMBAR 4-5 TRANSFORAMINAL LUMBAR INTERBODY FUSION WITH INSTRUMENTATION AND ALLOGRAFT (Left) - LEFT SIDED LUMBAR 4-5 TRANSFORAMINAL LUMBAR INTERBODY FUSION WITH INSTRUMENTATION AND ALLOGRAFT  Patient Location: PACU  Anesthesia Type:General  Level of Consciousness: awake  Airway & Oxygen Therapy: Patient Spontanous Breathing and Patient connected to nasal cannula oxygen  Post-op Assessment: Report given to RN and Post -op Vital signs reviewed and stable  Post vital signs: Reviewed and stable  Last Vitals:  Vitals:   09/11/16 0958  BP: 128/71  Pulse: 79  Resp: 20  Temp: 37.1 C    Last Pain:  Vitals:   09/11/16 0958  TempSrc: Oral  PainSc:          Complications: No apparent anesthesia complications

## 2016-09-11 NOTE — Anesthesia Procedure Notes (Signed)
Procedure Name: Intubation Date/Time: 09/11/2016 12:59 PM Performed by: Roderic Palau Pre-anesthesia Checklist: Patient identified, Emergency Drugs available, Suction available and Patient being monitored Patient Re-evaluated:Patient Re-evaluated prior to inductionOxygen Delivery Method: Circle system utilized Preoxygenation: Pre-oxygenation with 100% oxygen Intubation Type: IV induction Ventilation: Mask ventilation without difficulty Laryngoscope Size: Mac and 3 Grade View: Grade II Tube type: Oral Tube size: 7.0 mm Number of attempts: 1 Airway Equipment and Method: Stylet Placement Confirmation: ETT inserted through vocal cords under direct vision,  positive ETCO2 and breath sounds checked- equal and bilateral Secured at: 22 cm Dental Injury: Teeth and Oropharynx as per pre-operative assessment

## 2016-09-11 NOTE — Anesthesia Postprocedure Evaluation (Signed)
Anesthesia Post Note  Patient: Melissa Blackburn  Procedure(s) Performed: Procedure(s) (LRB): LEFT SIDED LUMBAR 4-5 TRANSFORAMINAL LUMBAR INTERBODY FUSION WITH INSTRUMENTATION AND ALLOGRAFT (Left)  Patient location during evaluation: PACU Anesthesia Type: General Level of consciousness: awake and awake and alert Pain management: pain level controlled Vital Signs Assessment: post-procedure vital signs reviewed and stable Respiratory status: spontaneous breathing, nonlabored ventilation and respiratory function stable Cardiovascular status: blood pressure returned to baseline Anesthetic complications: no       Last Vitals:  Vitals:   09/11/16 1807 09/11/16 1827  BP:    Pulse: 63 (!) 58  Resp: 17 20  Temp:      Last Pain:  Vitals:   09/11/16 1827  TempSrc:   PainSc: 3                  Cadance Raus COKER

## 2016-09-11 NOTE — Progress Notes (Signed)
Dr Linna Caprice here-giving IV Precedex for pain. Will continue to monitor

## 2016-09-12 MED FILL — Thrombin For Soln 20000 Unit: CUTANEOUS | Qty: 1 | Status: AC

## 2016-09-12 MED FILL — Heparin Sodium (Porcine) Inj 1000 Unit/ML: INTRAMUSCULAR | Qty: 30 | Status: AC

## 2016-09-12 MED FILL — Sodium Chloride IV Soln 0.9%: INTRAVENOUS | Qty: 1000 | Status: AC

## 2016-09-12 MED FILL — diazePAM 5 MG TABS: 5 | 7 days supply | Qty: 30 | Fill #0

## 2016-09-12 MED FILL — OXYCODONE W/APAP 5/325 TAB: 5-325 | 7 days supply | Qty: 40 | Fill #0

## 2016-09-12 NOTE — Op Note (Signed)
NAME:  ZAWADI, BAILIN NO.:  [id number]  MEDICAL RECORD NO.:  [id number]  LOCATION:                                 FACILITY:  PHYSICIAN:  Phylliss Bob, MD      DATE OF BIRTH:  August 31, 1955  DATE OF PROCEDURE:  09/11/2016                              OPERATIVE REPORT   PREOPERATIVE DIAGNOSES: 1. Grade 1 L4-5 spondylolisthesis. 2. L4-5 spinal stenosis. 3. Bilateral leg pain.  POSTOPERATIVE DIAGNOSES: 1. Grade 1 L4-5 spondylolisthesis. 2. L4-5 spinal stenosis. 3. Bilateral leg pain.  PROCEDURE: 1. Bilateral L4-5 laminectomy with bilateral partial facetectomy with     decompression of the right and left lateral recess. 2. Left-sided L4-5 transforaminal lumbar interbody fusion. 3. Right-sided L4-5 posterolateral fusion. 4. Insertion of interbody device x1 (CONCORDE bullet cage). 5. Placement of posterior instrumentation L4, L5. 6. Use of local autograft. 7. Use of morselized allograft.  SURGEON:  Phylliss Bob, MD.  ASSISTANTPricilla Holm, PA-C.  ANESTHESIA:  General endotracheal anesthesia.  COMPLICATIONS:  None.  DISPOSITION:  Stable.  ESTIMATED BLOOD LOSS:  150 mL.  INDICATIONS FOR SURGERY:  Briefly, Ms. Skeete is a pleasant 61 year old female, who did present to me with ongoing pain in her bilateral legs, right greater than left.  The patient did have an MRI which was notable for rather equivocal findings.  She did have minimal to moderate stenosis at L4-5, however, there was a slight spondylolisthesis also noted at L4-5.  Her pain was consistent with neurogenic claudication. She did get temporary improvement with her initial steroid injection, particularly with regard to the left leg.  The patient was interested in surgical intervention.  I did express to her that her symptoms were not 100% classic with neurogenic claudication from spinal stenosis.  I did also expressed to her the findings on her MRI, and that her stenosis was minimal to  moderate.  This did give me some pause as to whether proceeding with surgery was the most appropriate course of action. However, given her ongoing pain, we did elect to proceed.  She was aware of the risks and limitations of surgery, specifically, the notion that she may or may not benefit from surgery.  OPERATIVE DETAILS:  On September 11, 2016, the patient was brought to surgery and general endotracheal anesthesia was administered.  The patient was placed prone on an operating room bed with a Wilson frame. The back was prepped and draped and a time-out procedure was performed. A midline incision was made overlying the L4-5 intervertebral space. The fascia was incised at the midline.  I then subperiosteally exposed the lamina of L4 and L5.  The landmarks of the starting points of the L4 and L5 pedicles were identified and exposed.  Using intraoperative fluoroscopy, I did cannulate the L4 and L5 pedicles bilaterally.  On the right side, I did decorticate the posterior elements and posterolateral gutter to help aid in the success of the fusion.  A 6 x 35 mm screws were placed on the right.  A 45 mm rod was placed and distraction was applied across the rod on the right side.  On the left side, bone wax was placed over  the cannulated pedicle holes.  I then proceeded with a bilateral L4-5 lateral recess decompression, performing a partial facetectomy bilaterally.  I was pleased with the decompression.  On the left side, I did extend the partial facetectomy more laterally.  With an assistant holding medial retraction of the left L5 nerve, I did perform a thorough and complete L4-5 intervertebral diskectomy.  The endplates were then prepared with a series of curettes and the intervertebral space was liberally packed with allograft and autograft.  Specifically, the allograft use was ViviGen.  An 11 x 27 mm intervertebral spacer was then also packed with autograft and allograft and tamped into  position. I was very pleased with the press-fit of the spacer.  I did liberally use AP and lateral fluoroscopy.  Distraction was then released on the contralateral right side.  On the left, I did place 6 x 35 mm screws.  A 40 mm rod was placed and caps were placed and a final locking procedure was performed over all 4 screws.  I was very pleased with the final AP and lateral fluoroscopic images.  I did then liberally packed allograft and autograft along the posterior elements on the right and also along the posterolateral gutter.  The wound was copiously irrigated with a total of approximately 2 L of normal saline prior to placing bone graft. The fascia was then closed using #1 Vicryl.  The remainder of the wound was closed using 2-0 Vicryl followed by 4-0 Monocryl.  Benzoin and Steri- Strips were applied followed by sterile dressing.  All instrument counts were correct at the termination of the procedure.  Of note, Pricilla Holm was my assistant throughout surgery, and did aid in retraction, suctioning, and closure from start to finish.     Phylliss Bob, MD   ______________________________ Phylliss Bob, MD    MD/MEDQ  D:  09/11/2016  T:  09/12/2016  Job:  [id number]

## 2016-09-12 NOTE — Progress Notes (Signed)
    Patient doing well Minimal back discomfort Has been ambulating Patient reports resolution of her bilateral leg pain   Physical Exam: Vitals:   09/12/16 0049 09/12/16 0700  BP: (!) 98/55 98/66  Pulse: (!) 58 66  Resp: 17 17  Temp: 98.1 F (36.7 C) 98.5 F (36.9 C)    Dressing in place NVI  POD #1 s/p L4/5 decompression and fusion, doing very well  - up with PT/OT, encourage ambulation - Percocet for pain, Valium for muscle spasms - likely d/c home later today with f/u in 2 weeks

## 2016-09-12 NOTE — Progress Notes (Signed)
Patient D/C home, alert and oriented, no new concerns, pain levels managed with current regimen. Teach back, D/C instructions and education provided, pt Verbalize understanding.

## 2016-09-12 NOTE — Progress Notes (Signed)
Occupational Therapy Evaluation Patient Details Name: Melissa Blackburn MRN: [id number] DOB: February 27, 1956 Today's Date: 09/12/2016    History of Present Illness LEFT SIDED LUMBAR 4-5 TRANSFORAMINAL LUMBAR INTERBODY FUSION WITH INSTRUMENTATION AND ALLOGRAFT   Clinical Impression   PTA, pt independent with ADL and mobility and works as Health visitor at [employer]. Pt making excellent progress despite pain. BP spine 90/47. Standing 101/68. After activity 100/67. Asymptomatic. Pt ready to DC home with intermittent S when medically stable. Will need 3 in1 for DC. OT signing off.     Follow Up Recommendations  No OT follow up;Supervision - Intermittent    Equipment Recommendations  3 in 1 bedside commode    Recommendations for Other Services       Precautions / Restrictions Precautions Precautions: Back Precaution Booklet Issued: Yes (comment) Required Braces or Orthoses: Spinal Brace Spinal Brace: Applied in sitting position;Applied in standing position Restrictions Other Position/Activity Restrictions: limitations with RUE due to recent stimulator      Mobility Bed Mobility Overal bed mobility: Modified Independent             General bed mobility comments: good use of log rolling with supine - sit  Transfers Overall transfer level: Needs assistance   Transfers: Sit to/from Stand Sit to Stand: Supervision         General transfer comment: vc for precuations    Balance Overall balance assessment: No apparent balance deficits (not formally assessed)                                          ADL Overall ADL's : Needs assistance/impaired                                     Functional mobility during ADLs: Supervision/safety General ADL Comments: Overall min A for LB ADL. Educated pt on compensatory techniques and use of AE for LB ADL. REcommended use of reacher adn long handled sponge. Family will be able ot assist. Pt able ot properly  instruct this therapist how to donn/doff TLSO. Educated on use of 3 in 1 for toilet and as a shower chair. Also educated on proper set up at home to maximize functional level of independence and reduce risk of falls. Pt verbalized understnading.      Vision     Perception     Praxis      Pertinent Vitals/Pain Pain Assessment: 0-10 Pain Score: 4  Pain Location: back Pain Descriptors / Indicators: Aching;Sharp Pain Intervention(s): Limited activity within patient's tolerance;Repositioned;Ice applied     Hand Dominance Right   Extremity/Trunk Assessment Upper Extremity Assessment Upper Extremity Assessment: RUE deficits/detail (neural stimulator implanted in R shoulder/ant chest for migr)   Lower Extremity Assessment Lower Extremity Assessment: Overall WFL for tasks assessed   Cervical / Trunk Assessment Cervical / Trunk Assessment: Other exceptions (L4-5 fusion)   Communication Communication Communication: No difficulties   Cognition Arousal/Alertness: Awake/alert Behavior During Therapy: WFL for tasks assessed/performed Overall Cognitive Status: Within Functional Limits for tasks assessed                     General Comments       Exercises       Shoulder Instructions      Home Living Family/patient expects to be discharged to:: Private  residence Living Arrangements: Spouse/significant other Available Help at Discharge: Family;Available 24 hours/day Type of Home: House Home Access: Stairs to enter [employer] of Steps: 2 Entrance Stairs-Rails: None Home Layout: Two level;Able to live on main level with bedroom/bathroom     Bathroom Shower/Tub: Tub/shower unit Shower/tub characteristics: Architectural technologist: Standard Bathroom Accessibility: Yes How Accessible: Accessible via walker Home Equipment: None          Prior Functioning/Environment Level of Independence: Independent        Comments: works as Health visitor at [employer] List: Decreased activity tolerance;Decreased knowledge of use of DME or AE;Decreased knowledge of precautions;Pain   OT Treatment/Interventions:      OT Goals(Current goals can be found in the care plan section) Acute Rehab OT Goals Patient Stated Goal: to go home OT Goal Formulation: All assessment and education complete, DC therapy  OT Frequency:     Barriers to D/C:            Co-evaluation              End of Session Equipment Utilized During Treatment: Back brace Nurse Communication: Mobility status  Activity Tolerance: Patient tolerated treatment well Patient left: in chair;with call bell/phone within reach   Time: [id number] OT Time Calculation (min): 30 min Charges:  OT General Charges $OT Visit: 1 Procedure OT Evaluation $OT Eval Moderate Complexity: 1 Procedure OT Treatments $Self Care/Home Management : 8-22 mins G-Codes:    Arietta Eisenstein,HILLARY October 05, 2016, 9:43 AM  Maurie Boettcher, OTR/L  [phone] October 05, 2016

## 2016-09-12 NOTE — Progress Notes (Signed)
Pt walked from bathroom, to bed, to nursing station and placed in chair.  Call bell and phone placed next to pt.  Pt was steady throughout.

## 2016-09-12 NOTE — Progress Notes (Addendum)
Pt. Placed on home CPAP with home nasal pillows, O2 bled in at 2 lpm, humidity filled tolerating well.

## 2016-09-12 NOTE — Care Management Note (Signed)
Case Management Note  Patient Details  Name: CARREY VITTITOE MRN: [id number] Date of Birth: [birth year]  Subjective/Objective:                    Action/Plan: Pt discharged home with self care. Pt with insurance and PCP. No f/u per PT and no DME needs.   Expected Discharge Date:  09/12/16               Expected Discharge Plan:  Home/Self Care  In-House Referral:     Discharge planning Services     Post Acute Care Choice:    Choice offered to:     DME Arranged:    DME Agency:     HH Arranged:    HH Agency:     Status of Service:  Completed, signed off  If discussed at [employer] of Stay Meetings, dates discussed:    Additional Comments:  Pollie Friar, RN 09/12/2016, 1:39 PM

## 2016-09-12 NOTE — Evaluation (Signed)
Physical Therapy Evaluation Patient Details Name: Melissa Blackburn MRN: [id number] DOB: 10-23-1955 Today's Date: 09/12/2016   History of Present Illness  LEFT SIDED LUMBAR 4-5 TRANSFORAMINAL LUMBAR INTERBODY FUSION WITH INSTRUMENTATION AND ALLOGRAFT  Clinical Impression  Pt is independent in mobility. She ambulated 200' without an assistive device, no loss of balance. She demonstrates understanding of log rolling and back precautions. From PT standpoint she is ready to DC home, no f/u PT nor DME needs. Encouraged pt to ambulate frequently. PT signing off.     Follow Up Recommendations No PT follow up    Equipment Recommendations  None recommended by PT    Recommendations for Other Services       Precautions / Restrictions Precautions Precautions: Back Precaution Booklet Issued: Yes (comment) Precaution Comments: reviewed back precautions Required Braces or Orthoses: Spinal Brace Spinal Brace: Applied in sitting position;Applied in standing position Restrictions Other Position/Activity Restrictions: limitations with RUE due to recent stimulator      Mobility  Bed Mobility Overal bed mobility: Modified Independent             General bed mobility comments: good technique with sit to sidelying to supine  Transfers Overall transfer level: Independent Equipment used: None Transfers: Sit to/from Stand Sit to Stand: Independent         General transfer comment: vc for precuations  Ambulation/Gait Ambulation/Gait assistance: Independent Ambulation Distance (Feet): 200 Feet Assistive device: None Gait Pattern/deviations: Decreased step length - right;Decreased step length - left;Wide base of support   Gait velocity interpretation: Below normal speed for age/gender General Gait Details: steady, no LOB, decr velocity 2* pain  Stairs            Wheelchair Mobility    Modified Rankin (Stroke Patients Only)       Balance Overall balance assessment: No  apparent balance deficits (not formally assessed)                                           Pertinent Vitals/Pain Pain Assessment: 0-10 Pain Score: 6  Pain Location: back Pain Descriptors / Indicators: Sore Pain Intervention(s): Limited activity within patient's tolerance;Monitored during session;Patient requesting pain meds-RN notified    Home Living Family/patient expects to be discharged to:: Private residence Living Arrangements: Spouse/significant other Available Help at Discharge: Family;Available 24 hours/day Type of Home: House Home Access: Stairs to enter Entrance Stairs-Rails: None Entrance Stairs-Number of Steps: 2 Home Layout: Two level;Able to live on main level with bedroom/bathroom Home Equipment: None      Prior Function Level of Independence: Independent         Comments: 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    Extremity/Trunk Assessment   Upper Extremity Assessment Upper Extremity Assessment: Defer to OT evaluation    Lower Extremity Assessment Lower Extremity Assessment: Overall WFL for tasks assessed (sensation intact to light touch BLEs)    Cervical / Trunk Assessment Cervical / Trunk Assessment: Other exceptions (L4-5 fusion)  Communication   Communication: No difficulties  Cognition Arousal/Alertness: Awake/alert Behavior During Therapy: WFL for tasks assessed/performed Overall Cognitive Status: Within Functional Limits for tasks assessed                      General Comments      Exercises     Assessment/Plan  PT Assessment Patent does not need any further PT services  PT Problem List            PT Treatment Interventions      PT Goals (Current goals can be found in the Care Plan section)  Acute Rehab PT Goals Patient Stated Goal: likes to hunt, fish, play football with grandkids PT Goal Formulation: All assessment and education complete, DC therapy     Frequency     Barriers to discharge        Co-evaluation               End of Session Equipment Utilized During Treatment: Back brace Activity Tolerance: Patient limited by pain Patient left: in bed;with call bell/phone within reach;with nursing/sitter in room Nurse Communication: Mobility status         Time: [id number] PT Time Calculation (min) (ACUTE ONLY): 13 min   Charges:   PT Evaluation $PT Eval Low Complexity: 1 Procedure     PT G CodesPhilomena Doheny 09/12/2016, 10:04 AM [phone]

## 2016-09-18 NOTE — Discharge Summary (Signed)
Patient ID: CHANA WARBRITTON MRN: [id number] DOB/AGE: 12/20/1955 61 y.o.  Admit date: 09/11/2016 Discharge date: 09/12/2016  Admission Diagnoses:  Active Problems:   Radiculopathy spinal stenosis  Discharge Diagnoses:  Same  Past Medical History:  Diagnosis Date  . Anemia    hx of  . Asthma    "adult onset" (09/11/2016)  . DDD (degenerative disc disease)    "hands, knees" (09/11/2016)  . Hypertension   . Migraine    "none since stimulator placed 07/2016" (09/11/2016)  . Needle stick injury with contaminated needle    patient undergoing testing  . Numbness and tingling 11/15/2012  . OSA on CPAP    cpap 7 (09/11/2016)  . Pain in limb 11/15/2012  . Paresthesias 11/15/2012  . Pneumonia 1980s X 1  . PONV (postoperative nausea and vomiting)    "severe nausea and vomiting"  . Weakness generalized 11/15/2012    Surgeries: Procedure(s): LEFT SIDED LUMBAR 4-5 TRANSFORAMINAL LUMBAR INTERBODY FUSION WITH INSTRUMENTATION AND ALLOGRAFT on 09/11/2016   Consultants: None  Discharged Condition: Improved  Hospital Course: HETVI OLDENKAMP is an 61 y.o. female who was admitted 09/11/2016 for operative treatment of spinal stenosis. Patient has severe unremitting pain that affects sleep, daily activities, and work/hobbies. After pre-op clearance the patient was taken to the operating room on 09/11/2016 and underwent  Procedure(s): LEFT SIDED LUMBAR 4-5 TRANSFORAMINAL LUMBAR INTERBODY FUSION WITH INSTRUMENTATION AND ALLOGRAFT.    Patient was given perioperative antibiotics:  Anti-infectives    Start     Dose/Rate Route Frequency Ordered Stop   09/11/16 2115  ceFAZolin (ANCEF) IVPB 2g/100 mL premix     2 g 200 mL/hr over 30 Minutes Intravenous Every 8 hours 09/11/16 2016 09/12/16 0545   09/11/16 0850  ceFAZolin (ANCEF) IVPB 2g/100 mL premix     2 g 200 mL/hr over 30 Minutes Intravenous On call to O.R. 09/11/16 0850 09/11/16 1315       Patient was given sequential compression devices, early ambulation  to prevent DVT.  Patient benefited maximally from hospital stay and there were no complications.    Recent vital signs: BP 96/63 (BP Location: Right Arm)   Pulse 73   Temp 98.5 F (36.9 C) (Oral)   Resp 17   Ht 5\' 11"  (1.803 m)   Wt 87.2 kg (192 lb 4.8 oz)   SpO2 100%   BMI 26.82 kg/m   Discharge Medications:   Allergies as of 09/12/2016      Reactions   Verapamil Other (See Comments)   Heart block per patient   Avelox [moxifloxacin Hcl In Nacl] Diarrhea   Flexeril [cyclobenzaprine Hcl] Other (See Comments)   Systolic Levels "bottoms out"   Imitrex [sumatriptan] Other (See Comments)   "Messes with my blood pressure - will cause it to go up and then down"   Midazolam Hcl Nausea And Vomiting   Neurontin [gabapentin] Other (See Comments)   Muscle spasms   Pregabalin Other (See Comments)   Muscle spasms   Tape Other (See Comments)   Blisters skin      Medication List    TAKE these medications   amitriptyline 50 MG tablet Commonly known as:  ELAVIL Take 50 mg by mouth at bedtime. For migraines.   atenolol 25 MG tablet Commonly known as:  TENORMIN Take 25 mg by mouth at bedtime.   docusate sodium 100 MG capsule Commonly known as:  COLACE Take 200 mg by mouth 2 (two) times daily as needed for mild constipation.  methocarbamol 500 MG tablet Commonly known as:  ROBAXIN Take 500 mg by mouth 3 (three) times daily as needed for muscle spasms.   promethazine 25 MG tablet Commonly known as:  PHENERGAN Take 25 mg by mouth every 6 (six) hours as needed for nausea or vomiting.       Diagnostic Studies: Dg Chest 2 View  Result Date: 09/05/2016 CLINICAL DATA:  Preop testing EXAM: CHEST  2 VIEW COMPARISON:  10/02/2012 FINDINGS: Stimulator battery pack projects over the right chest. Heart and mediastinal contours are within normal limits. No focal opacities or effusions. No acute bony abnormality. IMPRESSION: No active cardiopulmonary disease. Electronically Signed   By: Rolm Baptise M.D.   On: 09/05/2016 13:22   Dg Lumbar Spine 2-3 Views  Result Date: 09/11/2016 CLINICAL DATA:  Left-sided L4-5 lumbar fusion. Fluoro time: 39 seconds EXAM: LUMBAR SPINE - 2-3 VIEW COMPARISON:  None. FINDINGS: AP and lateral fluoroscopic intraoperative images of the lower lumbar spine are provided showing posterior intrapedicular fixation hardware at the L4 and L5 levels. Hardware appears intact and appropriately positioned. Intervening disc spacer/cage in place. IMPRESSION: Fixation hardware at the L4 and L5 levels. No evidence of surgical complicating feature. Electronically Signed   By: Franki Cabot M.D.   On: 09/11/2016 16:30   Dg Lumbar Spine 1 View  Result Date: 09/11/2016 CLINICAL DATA:  Preop, localization of L4-5 for fusion EXAM: LUMBAR SPINE - 1 VIEW COMPARISON:  MR lumbar spine of 05/07/2016 and lumbar spine films of 04/16/2016 FINDINGS: Cross-table lateral view from the operating room labeled 1 shows needles posteriorly for localization and directed toward the L3 spinous process and just above the spinous process of L5. IMPRESSION: Needles for localization as described above. I called this report to the operating room at the time of interpretation. Electronically Signed   By: Ivar Drape M.D.   On: 09/11/2016 13:33   Dg C-arm 61-120 Min  Result Date: 09/11/2016 CLINICAL DATA:  Left-sided L4-5 lumbar fusion EXAM: DG C-ARM 61-120 MIN COMPARISON:  Lumbar spine films from today FINDINGS: C-arm fluoroscopy was provided during posterior fusion at L4-5. Fluoroscopy time of 39 seconds was recorded. IMPRESSION: C-arm fluoroscopy provided during posterior fusion at L4-5. Electronically Signed   By: Ivar Drape M.D.   On: 09/11/2016 16:29    Disposition: 01-Home or Self Care   POD #1 s/p L4/5 decompression and fusion, doing very well  - up with PT/OT, encourage ambulation - Percocet for pain, Valium for muscle spasms -Written scripts for pain signed and in chart -D/C instructions sheet  printed and in chart -D/C today  -F/U in office 2 weeks   Signed: Justice Britain 09/18/2016, 12:35 PM

## 2016-09-26 MED FILL — OXYCODONE W/APAP 5/325 TAB: 5-325 | 3 days supply | Qty: 20 | Fill #0

## 2016-10-02 ENCOUNTER — Emergency Department (HOSPITAL_COMMUNITY): Payer: 59

## 2016-10-02 ENCOUNTER — Encounter (HOSPITAL_COMMUNITY): Payer: Self-pay | Admitting: Emergency Medicine

## 2016-10-02 ENCOUNTER — Emergency Department (HOSPITAL_COMMUNITY)
Admission: EM | Admit: 2016-10-02 | Discharge: 2016-10-02 | Disposition: A | Discharge status: Home / Self Care | Payer: 59 | Attending: Emergency Medicine | Admitting: Emergency Medicine

## 2016-10-02 DIAGNOSIS — R05 Cough: Secondary | ICD-10-CM | POA: Insufficient documentation

## 2016-10-02 DIAGNOSIS — R0600 Dyspnea, unspecified: Secondary | ICD-10-CM

## 2016-10-02 DIAGNOSIS — I10 Essential (primary) hypertension: Secondary | ICD-10-CM | POA: Diagnosis not present

## 2016-10-02 DIAGNOSIS — R0602 Shortness of breath: Secondary | ICD-10-CM | POA: Insufficient documentation

## 2016-10-02 DIAGNOSIS — J45909 Unspecified asthma, uncomplicated: Secondary | ICD-10-CM | POA: Diagnosis not present

## 2016-10-02 DIAGNOSIS — Z79899 Other long term (current) drug therapy: Secondary | ICD-10-CM | POA: Diagnosis not present

## 2016-10-02 LAB — COMPREHENSIVE METABOLIC PANEL
ALK PHOS: 170 U/L — AB (ref 38–126)
ALT: 132 U/L — AB (ref 14–54)
AST: 71 U/L — ABNORMAL HIGH (ref 15–41)
Albumin: 3.9 g/dL (ref 3.5–5.0)
Anion gap: 10 (ref 5–15)
BUN: 12 mg/dL (ref 6–20)
CALCIUM: 9.4 mg/dL (ref 8.9–10.3)
CO2: 24 mmol/L (ref 22–32)
CREATININE: 0.9 mg/dL (ref 0.44–1.00)
Chloride: 104 mmol/L (ref 101–111)
GFR calc non Af Amer: >60 mL/min (ref >60)
Glucose, Bld: 92 mg/dL (ref 65–99)
Potassium: 3.4 mmol/L — ABNORMAL LOW (ref 3.5–5.1)
SODIUM: 138 mmol/L (ref 135–145)
Total Bilirubin: 0.6 mg/dL (ref 0.3–1.2)
Total Protein: 7.2 g/dL (ref 6.5–8.1)

## 2016-10-02 LAB — CBC WITH DIFFERENTIAL/PLATELET
Basophils Absolute: 0 10*3/uL (ref 0.0–0.1)
Basophils Relative: 0 %
EOS ABS: 0.1 10*3/uL (ref 0.0–0.7)
Eosinophils Relative: 2 %
HCT: 40.5 % (ref 36.0–46.0)
Hemoglobin: 13.2 g/dL (ref 12.0–15.0)
LYMPHS ABS: 1.4 10*3/uL (ref 0.7–4.0)
Lymphocytes Relative: 35 %
MCH: 30.8 pg (ref 26.0–34.0)
MCHC: 32.6 g/dL (ref 30.0–36.0)
MCV: 94.4 fL (ref 78.0–100.0)
MONOS PCT: 16 %
Monocytes Absolute: 0.7 10*3/uL (ref 0.1–1.0)
Neutro Abs: 1.9 10*3/uL (ref 1.7–7.7)
Neutrophils Relative %: 47 %
PLATELETS: 295 10*3/uL (ref 150–400)
RBC: 4.29 MIL/uL (ref 3.87–5.11)
RDW: 13.7 % (ref 11.5–15.5)
WBC: 4 10*3/uL (ref 4.0–10.5)

## 2016-10-02 LAB — BRAIN NATRIURETIC PEPTIDE: B Natriuretic Peptide: 5 pg/mL (ref 0.0–100.0)

## 2016-10-02 LAB — TROPONIN I: Troponin I: <0.03 ng/mL (ref <0.03)

## 2016-10-02 LAB — D-DIMER, QUANTITATIVE (NOT AT ARMC): D DIMER QUANT: 1.52 ug{FEU}/mL — AB (ref 0.00–0.50)

## 2016-10-02 MED ORDER — ACETAMINOPHEN 500 MG PO TABS
1000.0000 mg | ORAL_TABLET | Freq: Once | ORAL | Status: AC
  Administered 2016-10-02: 1000 mg via ORAL
  Filled 2016-10-02: qty 2

## 2016-10-02 MED ORDER — IOPAMIDOL (ISOVUE-370) INJECTION 76%
75.0000 mL | Freq: Once | INTRAVENOUS | Status: AC | PRN
  Administered 2016-10-02: 75 mL via INTRAVENOUS

## 2016-10-02 MED ORDER — IPRATROPIUM-ALBUTEROL 0.5-2.5 (3) MG/3ML IN SOLN
3.0000 mL | Freq: Once | RESPIRATORY_TRACT | Status: AC
  Administered 2016-10-02: 3 mL via RESPIRATORY_TRACT
  Filled 2016-10-02: qty 3

## 2016-10-02 NOTE — Discharge Instructions (Signed)
Continue your medications as before. ° °Return to the emergency department if your symptoms significantly worsen or change. °

## 2016-10-02 NOTE — ED Provider Notes (Signed)
[facility] DEPT Provider Note   CSN: [id number] Arrival date & time: 10/02/16  1721     History   Chief Complaint Chief Complaint  Patient presents with  . Shortness of Breath    HPI Melissa Blackburn is a 61 y.o. female.  Patient is a 61 year old female with past medical history of hypertension, asthma, anemia. She presents for evaluation of shortness of breath. She reports multiple family members with flulike symptoms at home. She started 5 days ago with chest congestion and cough. This afternoon she developed difficulty breathing. She does report productive cough. She denies chest pain, leg swelling, or fevers.   The history is provided by the patient.  Shortness of Breath  This is a new problem. The problem occurs continuously.The problem has been gradually worsening. Associated symptoms include cough and sputum production. Pertinent negatives include no fever, no chest pain, no leg pain and no leg swelling. She has tried nothing for the symptoms. Associated medical issues do not include COPD or CAD.    Past Medical History:  Diagnosis Date  . Anemia    hx of  . Asthma    "adult onset" (09/11/2016)  . DDD (degenerative disc disease)    "hands, knees" (09/11/2016)  . Hypertension   . Migraine    "none since stimulator placed 07/2016" (09/11/2016)  . Needle stick injury with contaminated needle    patient undergoing testing  . Numbness and tingling 11/15/2012  . OSA on CPAP    cpap 7 (09/11/2016)  . Pain in limb 11/15/2012  . Paresthesias 11/15/2012  . Pneumonia 1980s X 1  . PONV (postoperative nausea and vomiting)    "severe nausea and vomiting"  . Weakness generalized 11/15/2012    Patient Active Problem List   Diagnosis Date Noted  . Radiculopathy 09/11/2016  . Weakness generalized 11/15/2012  . Paresthesias 11/15/2012  . Pain in limb 11/15/2012  . Numbness and tingling 11/15/2012    Past Surgical History:  Procedure Laterality Date  . BACK SURGERY    .  ESOPHAGOGASTRODUODENOSCOPY (EGD) WITH PROPOFOL N/A 01/10/2016   Procedure: ESOPHAGOGASTRODUODENOSCOPY (EGD) WITH PROPOFOL;  Surgeon: Milus Banister, MD;  Location: [facility];  Service: Endoscopy;  Laterality: N/A;  . KNEE ARTHROSCOPY  04/23/2012   Procedure: ARTHROSCOPY KNEE;  Surgeon: Augustin Schooling, MD;  Location: [facility];  Service: Orthopedics;  Laterality: Left;  lateral meniscectomy  . KNEE ARTHROSCOPY Bilateral    "bone fragments"  . KNEE ARTHROSCOPY W/ ACL RECONSTRUCTION Left 2009  . KNEE ARTHROSCOPY W/ MENISCAL REPAIR Left 1981  . KNEE ARTHROSCOPY W/ MENISCAL REPAIR Left 2012  . LUMBAR FUSION Left 09/11/2016   LUMBAR 4-5 TRANSFORAMINAL LUMBAR INTERBODY FUSION WITH INSTRUMENTATION AND Gertha Calkin Archie Endo 09/11/2016  . PERIPHERAL NERVE STIMULATOR Right 07/2016   "in my chest; to treat migraine headaches"  . RECTOCELE REPAIR    . SHOULDER ARTHROSCOPY W/ SUPERIOR LABRAL ANTERIOR POSTERIOR REPAIR Bilateral    "I have rivets in both shoulders to hold them together" (09/11/2016)  . SHOULDER ARTHROSCOPY WITH SUBACROMIAL DECOMPRESSION Right 1994 X 2; 1995; ~ 2010  . UPPER ESOPHAGEAL ENDOSCOPIC ULTRASOUND (EUS)  01/10/2016   Procedure: UPPER ESOPHAGEAL ENDOSCOPIC ULTRASOUND (EUS);  Surgeon: Milus Banister, MD;  Location: [facility];  Service: Endoscopy;;  . VAGINAL HYSTERECTOMY  1986   "partial"    OB History    Gravida Para Term Preterm AB Living   3 2 2   1 2    SAB TAB Ectopic Multiple Live Births   1  Home Medications    Prior to Admission medications   Medication Sig Start Date End Date Taking? Authorizing Provider  amitriptyline (ELAVIL) 50 MG tablet Take 50 mg by mouth at bedtime. For migraines.    Historical Provider, MD  atenolol (TENORMIN) 25 MG tablet Take 25 mg by mouth at bedtime.  12/18/15   Historical Provider, MD  docusate sodium (COLACE) 100 MG capsule Take 200 mg by mouth 2 (two) times daily as needed for mild constipation.    Historical Provider, MD    methocarbamol (ROBAXIN) 500 MG tablet Take 500 mg by mouth 3 (three) times daily as needed for muscle spasms. 07/15/16   Historical Provider, MD  promethazine (PHENERGAN) 25 MG tablet Take 25 mg by mouth every 6 (six) hours as needed for nausea or vomiting.    Historical Provider, MD    Family History Family History  Problem Relation Age of Onset  . Cancer Mother     Breast cancer  . Hypertension Father   . COPD Father   . Coronary artery disease Father   . Cancer Maternal Grandmother     Bone cancer  . Coronary artery disease Maternal Grandfather     Social History Social History  Substance Use Topics  . Smoking status: Never Smoker  . Smokeless tobacco: Never Used  . Alcohol use No     Allergies   Verapamil; Avelox [moxifloxacin hcl in nacl]; Flexeril [cyclobenzaprine hcl]; Imitrex [sumatriptan]; Midazolam hcl; Neurontin [gabapentin]; Pregabalin; and Tape   Review of Systems Review of Systems  Constitutional: Negative for fever.  Respiratory: Positive for cough, sputum production and shortness of breath.   Cardiovascular: Negative for chest pain and leg swelling.  All other systems reviewed and are negative.    Physical Exam Updated Vital Signs BP 131/80 (BP Location: Left Arm)   Pulse 115   Temp 98.3 F (36.8 C) (Oral)   Resp 26   Ht 5\' 11"  (1.803 m)   Wt 185 lb (83.9 kg)   SpO2 100%   BMI 25.80 kg/m   Physical Exam  Constitutional: She is oriented to person, place, and time. She appears well-developed and well-nourished. No distress.  HENT:  Head: Normocephalic and atraumatic.  Mouth/Throat: Oropharynx is clear and moist.  Neck: Normal range of motion. Neck supple.  Cardiovascular: Normal rate and regular rhythm.  Exam reveals no gallop and no friction rub.   No murmur heard. Pulmonary/Chest: Effort normal and breath sounds normal. No respiratory distress. She has no wheezes.  Abdominal: Soft. Bowel sounds are normal. She exhibits no distension. There  is no tenderness.  Musculoskeletal: Normal range of motion.  Neurological: She is alert and oriented to person, place, and time.  Skin: Skin is warm and dry. She is not diaphoretic.  Nursing note and vitals reviewed.    ED Treatments / Results  Labs (all labs ordered are listed, but only abnormal results are displayed) Labs Reviewed  COMPREHENSIVE METABOLIC PANEL  BRAIN NATRIURETIC PEPTIDE  CBC WITH DIFFERENTIAL/PLATELET  D-DIMER, QUANTITATIVE (NOT AT [facility])    EKG  EKG Interpretation None       Radiology No results found.  Procedures Procedures (including critical care time)  Medications Ordered in ED Medications  ipratropium-albuterol (DUONEB) 0.5-2.5 (3) MG/3ML nebulizer solution 3 mL (not administered)     Initial Impression / Assessment and Plan / ED Course  I have reviewed the triage vital signs and the nursing notes.  Pertinent labs & imaging results that were available during my care of  the patient were reviewed by me and considered in my medical decision making (see chart for details).  Patient presents with complaints of shortness of breath, cough, and congestion. She reports several family members having had the flu.  She also underwent some sort of spinal surgery approximately 2 weeks ago, raising my concern for pulmonary embolism. She did have a positive d-dimer, however her PE study was negative.  Throughout her emergency department course, she has remained without hypoxia and her vitals have been stable. There are no other emergent findings on her CAT scan. When I examine her for a final time, her oxygen saturations were 100%. Will be discharged, to follow-up as needed.  Final Clinical Impressions(s) / ED Diagnoses   Final diagnoses:  None    New Prescriptions New Prescriptions   No medications on file     Veryl Speak, MD 10/02/16 2117

## 2016-10-02 NOTE — ED Notes (Signed)
Ambulated to restroom without assistance, pt became more sob with ambulation

## 2016-10-02 NOTE — ED Triage Notes (Signed)
Pt became sob yesterday, recently had flu and back surgery. Pt alert and oriented. Pt also weak and dizzy with ambulation.

## 2016-10-02 NOTE — ED Notes (Signed)
Pulse Ox while ambulating:  99%-100%. 

## 2016-10-06 MED FILL — AMITRIPTYLINE HCL 50 MG TAB: 50 | 30 days supply | Qty: 60 | Fill #0

## 2016-10-06 MED FILL — ATENOLOL 25 MG TABLET: 25 | 30 days supply | Qty: 30 | Fill #3

## 2016-10-29 ENCOUNTER — Encounter (HOSPITAL_COMMUNITY): Payer: Self-pay | Admitting: Physical Therapy

## 2016-10-29 ENCOUNTER — Ambulatory Visit (HOSPITAL_COMMUNITY): Payer: PRIVATE HEALTH INSURANCE | Attending: Orthopedic Surgery | Admitting: Physical Therapy

## 2016-10-29 DIAGNOSIS — M6281 Muscle weakness (generalized): Secondary | ICD-10-CM | POA: Insufficient documentation

## 2016-10-29 DIAGNOSIS — R293 Abnormal posture: Secondary | ICD-10-CM | POA: Insufficient documentation

## 2016-10-29 DIAGNOSIS — R262 Difficulty in walking, not elsewhere classified: Secondary | ICD-10-CM | POA: Diagnosis present

## 2016-10-29 DIAGNOSIS — R29898 Other symptoms and signs involving the musculoskeletal system: Secondary | ICD-10-CM | POA: Insufficient documentation

## 2016-10-29 NOTE — Therapy (Signed)
[place], [place], [address] Phone: [phone]   Fax:  [phone]  Physical Therapy Evaluation  Patient Details  Name: Melissa Blackburn MRN: [id number] Date of Birth: Oct 24, 1955 Referring Provider: Phylliss Bob   Encounter Date: 10/29/2016      PT End of Session - 10/29/16 1354    Visit Number 1   Number of Visits 12   Date for PT Re-Evaluation 11/19/16   Authorization Type Worker's Comp (12 sessions approved as of 10/29/16)   Authorization Time Period 10/29/16 to 12/10/16   Authorization - Visit Number 1   Authorization - Number of Visits 12   PT Start Time 1308   PT Stop Time 1343   PT Time Calculation (min) 35 min   Activity Tolerance Patient tolerated treatment well   Behavior During Therapy [facility] for tasks assessed/performed      Past Medical History:  Diagnosis Date  . Anemia    hx of  . Asthma    "adult onset" (09/11/2016)  . DDD (degenerative disc disease)    "hands, knees" (09/11/2016)  . Hypertension   . Migraine    "none since stimulator placed 07/2016" (09/11/2016)  . Needle stick injury with contaminated needle    patient undergoing testing  . Numbness and tingling 11/15/2012  . OSA on CPAP    cpap 7 (09/11/2016)  . Pain in limb 11/15/2012  . Paresthesias 11/15/2012  . Pneumonia 1980s X 1  . PONV (postoperative nausea and vomiting)    "severe nausea and vomiting"  . Weakness generalized 11/15/2012    Past Surgical History:  Procedure Laterality Date  . BACK SURGERY    . ESOPHAGOGASTRODUODENOSCOPY (EGD) WITH PROPOFOL N/A 01/10/2016   Procedure: ESOPHAGOGASTRODUODENOSCOPY (EGD) WITH PROPOFOL;  Surgeon: Milus Banister, MD;  Location: [facility];  Service: Endoscopy;  Laterality: N/A;  . KNEE ARTHROSCOPY  04/23/2012   Procedure: ARTHROSCOPY KNEE;  Surgeon: Augustin Schooling, MD;  Location: [place];  Service: Orthopedics;  Laterality: Left;  lateral meniscectomy  . KNEE ARTHROSCOPY Bilateral    "bone  fragments"  . KNEE ARTHROSCOPY W/ ACL RECONSTRUCTION Left 2009  . KNEE ARTHROSCOPY W/ MENISCAL REPAIR Left 1981  . KNEE ARTHROSCOPY W/ MENISCAL REPAIR Left 2012  . LUMBAR FUSION Left 09/11/2016   LUMBAR 4-5 TRANSFORAMINAL LUMBAR INTERBODY FUSION WITH INSTRUMENTATION AND Gertha Calkin Archie Endo 09/11/2016  . PERIPHERAL NERVE STIMULATOR Right 07/2016   "in my chest; to treat migraine headaches"  . RECTOCELE REPAIR    . SHOULDER ARTHROSCOPY W/ SUPERIOR LABRAL ANTERIOR POSTERIOR REPAIR Bilateral    "I have rivets in both shoulders to hold them together" (09/11/2016)  . SHOULDER ARTHROSCOPY WITH SUBACROMIAL DECOMPRESSION Right 1994 X 2; 1995; ~ 2010  . UPPER ESOPHAGEAL ENDOSCOPIC ULTRASOUND (EUS)  01/10/2016   Procedure: UPPER ESOPHAGEAL ENDOSCOPIC ULTRASOUND (EUS);  Surgeon: Milus Banister, MD;  Location: [facility];  Service: Endoscopy;;  . VAGINAL HYSTERECTOMY  1986   "partial"    There were no vitals filed for this visit.       Subjective Assessment - 10/29/16 1311    Subjective Patient was injured at work in August 2017; she received PT at this clinic but it did not make a significant difference due to structural damage in the spine. She had a lumbar fusion performed by Dr. Lynann Bologna on 09/11/16; she is still in the [place]. Her biggest complaint right now is hip pain as she was told she has developed bursitis. She reports the  MD decided to send her to PT early due to severe hip pain. She is severely limited by hip pain, she cannot walk as much as she'd like to. She states the MD said no twisting, straining, cannot bend, no squatting, cannot pick anything up, no straining in PT. No balance issues.    Pertinent History DDD, permanent nerve stimular implant for migranes, multiple knee surgeries, lumbar fusion 09/11/16   How long can you sit comfortably? 3/21- 30 minutes    How long can you stand comfortably? 3/21- 30 minutes at best    How long can you walk comfortably? 3/21- household distances are  fine, but stairs are difficult. Pain increases with extended walking.    Patient Stated Goals get rid of pain, get back to outdoor activities like hunting, get back to PLOF    Currently in Pain? Yes   Pain Score 4    Pain Location Hip   Pain Orientation Left   Pain Descriptors / Indicators Aching   Pain Type Chronic pain   Pain Radiating Towards radiates down into groin and lateral thigh    Pain Onset More than a month ago   Pain Frequency Constant   Aggravating Factors  walking too long, stairs, pressure on the area    Pain Relieving Factors ice    Effect of Pain on Daily Activities severe impact             [facility] PT Assessment - 10/29/16 0001      Assessment   Medical Diagnosis lumbar fusion    Referring Provider Phylliss Bob    Onset Date/Surgical Date 09/11/16   Next MD Visit Dr. Lynann Bologna April 13th      Precautions   Precaution Comments no squatting, twisting, bending, picking things up, straining, etc. She must be in the [place] brace for 3 months (until May).      Balance Screen   Has the patient fallen in the past 6 months No   Has the patient had a decrease in activity level because of a fear of falling?  No   Is the patient reluctant to leave their home because of a fear of falling?  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   Leisure hunting, outdoor activities      Strength   Right Hip Flexion 3+/5   Right Hip ABduction 4/5   Left Hip Flexion 3+/5   Left Hip ABduction 4/5   Right Knee Flexion 5/5   Right Knee Extension 5/5   Left Knee Flexion 5/5   Left Knee Extension 5/5   Right Ankle Dorsiflexion 5/5   Left Ankle Dorsiflexion 5/5     Flexibility   Hamstrings WNL    Piriformis mild limitation L LE      Palpation   Palpation comment noted edema lateral L hip, tenderness in general area of bursa of hip      Transfers   Five time sit  to stand comments  17.33 no UEs      6 minute walk test results    Aerobic Endurance Distance Walked 634   Endurance additional comments 3MWT     High Level Balance   High Level Balance Comments TUG 11.8  PT Education - 10/29/16 1353    Education provided Yes   Education Details prognosis, POC, HEP; she needs to be very proactive in lettting [place] know if an exercise is causing pain at surgical site or excessive strain in order to protect post-op area/adhere to MD precautions    Person(s) Educated Patient   Methods Explanation;Demonstration   Comprehension Verbalized understanding;Returned demonstration;Need further instruction          PT Short Term Goals - 10/29/16 1359      PT SHORT TERM GOAL #1   Title Patient to experience pain no more than 2/10 during all functional tasks and activities in order to improve QOL and functional task performance    Time 3   Period Weeks   Status New     PT SHORT TERM GOAL #2   Title Patient to be able to complete at least 60% of walking program recommended by MD without pain exacerbation in order to assist in adhering to post-op recommendations and to improve  recovery    Time 3   Period Weeks   Status New     PT SHORT TERM GOAL #3   Title Patient to be independent in correctly and consistently performing targeted HEP, to be updated as appropriate    Time 3   Period Weeks   Status New           PT Long Term Goals - 10/29/16 1401      PT LONG TERM GOAL #1   Title Patient to demonstrate functional strength 5/5 in all tested muscle groups in order to assist in reducing pain and facilitating return to PLOF    Time 6   Period Weeks   Status New     PT LONG TERM GOAL #2   Title Patient to be able to complete 100% of MD recommended walking program without pain exacerbation in order to allow her to follow post-op recommendations and to aide recovery    Time 6   Period Weeks   Status New      PT LONG TERM GOAL #3   Title Patient to report she has been able to sleep through the night without waking due to pain in order to improve overall QOL    Time 6   Period Weeks   Status New     PT LONG TERM GOAL #4   Title Patient to demonstrate improved gait mechanics including elimination of antalgic pattern and L Hip hike in order to reduce pain and improve efficiency of overall mobility    Time 6   Period Weeks   Status New               Plan - 10/29/16 1357    Clinical Impression Statement Patient arrives approximately 1.5 months after lumbar fusion surgery; she is still in the [place] brace and reports that her MD actually sent her to PT early as she has been having severe hip bursitis symptoms ever since surgery. She is not to bend, twist, lift anything, squat, strain, etc at this point, and her pain is so severe that she has had difficulty in adhering to MD's walking program recommendations post surgery.  Examination reveals significant gait impairment, mild unsteadiness, and mild functional strength deviations; also note localized edema in general area of L trochanteric bursa. Patient will benefit from cautious skilled PT services in order to address functional limitations, reduce pain, and allow patient to return to MD prescribed walking program post-op.  Rehab Potential Fair   Clinical Impairments Affecting Rehab Potential (+) motivated to participate in skilled PT services, high PLOF; (-) extensive lumbar surgeries, circumstances leading to difficulty with pain control   PT Frequency 2x / week   PT Duration 6 weeks   PT Treatment/Interventions ADLs/Self Care Home Management;Biofeedback;Cryotherapy;Moist Heat;Gait training;Stair training;Functional mobility training;Therapeutic activities;Therapeutic exercise;Balance training;Neuromuscular re-education;Patient/family education;Manual techniques;Passive range of motion;Energy conservation;Taping   PT Next Visit Plan PER MD- NO  BENDING, LIFTING, TWISTING, SQUATTING, STRAINING, ETC. Review HEP and initial eval. Cautious strengthening of proximal musculature, ice massage to L hip. Gait training.    PT Home Exercise Plan 3/21- seated marches with neutral spine, seated clams, sit to stand with no UEs (low reps)   Consulted and Agree with Plan of Care Patient      Patient will benefit from skilled therapeutic intervention in order to improve the following deficits and impairments:  Abnormal gait, Improper body mechanics, Pain, Decreased coordination, Decreased mobility, Decreased activity tolerance, Decreased strength, Difficulty walking  Visit Diagnosis: Muscle weakness (generalized) - Plan: PT plan of care cert/re-cert  Abnormal posture - Plan: PT plan of care cert/re-cert  Other symptoms and signs involving the musculoskeletal system - Plan: PT plan of care cert/re-cert  Difficulty in walking, not elsewhere classified - Plan: PT plan of care cert/re-cert     Problem List Patient Active Problem List   Diagnosis Date Noted  . Radiculopathy 09/11/2016  . Weakness generalized 11/15/2012  . Paresthesias 11/15/2012  . Pain in limb 11/15/2012  . Numbness and tingling 11/15/2012    Deniece Ree PT, DPT [place] [address] [place], [place], [address] Phone: [phone]   Fax:  [phone]  Name: Melissa Blackburn MRN: [id number] Date of Birth: 1955-09-19

## 2016-10-29 NOTE — Patient Instructions (Signed)
   SEATED MARCHING  While seated in a chair, lift up your foot and knee, set it down and then perform on the other leg.   Repeat this alternating movement 10 times each side, twice a day.    ELASTIC BAND - SEATED CLAMS  While sitting in a chair and an elastic band wrapped around your knees, move both knees to the sides to separate your legs. Keep contact of your feet on the floor the entire time.   Repeat 10 times, twice a day.     SIT TO STAND - NO SUPPORT  Start by scooting close to the front of the chair.  Next, lean forward at your trunk and reach forward with your arms and rise to standing without using your hands to push off from the chair or other object.   Use your arms as a counter-balance by reaching forward when in sitting and lower them as you approach standing.  Only repeat 2-3 times; as it becomes more tolerable, you may add 1-2 reps to progress yourself.  Repeat twice a day.

## 2016-10-30 ENCOUNTER — Ambulatory Visit (HOSPITAL_COMMUNITY): Payer: PRIVATE HEALTH INSURANCE | Admitting: Physical Therapy

## 2016-10-30 ENCOUNTER — Telehealth (HOSPITAL_COMMUNITY): Payer: Self-pay | Admitting: Physical Therapy

## 2016-10-30 NOTE — Telephone Encounter (Signed)
Patient a no-show for today's session. Called and spoke to patient, who reports that she thought her appointment was at 10:45. Educated we do not have this slot, she reports she must have mis-read her schedule and apologizes for missing today.  She plans to be at her appointment on 3/27.   Deniece Ree PT, DPT [phone]

## 2016-11-03 ENCOUNTER — Telehealth (HOSPITAL_COMMUNITY): Payer: Self-pay | Admitting: Physical Therapy

## 2016-11-03 NOTE — Telephone Encounter (Signed)
She will have dental surgery

## 2016-11-04 ENCOUNTER — Ambulatory Visit (HOSPITAL_COMMUNITY): Payer: PRIVATE HEALTH INSURANCE | Admitting: Physical Therapy

## 2016-11-05 MED FILL — ATENOLOL 25 MG TABLET: 25 | 30 days supply | Qty: 30 | Fill #4

## 2016-11-06 ENCOUNTER — Encounter (HOSPITAL_COMMUNITY): Payer: Self-pay

## 2016-11-06 ENCOUNTER — Ambulatory Visit (HOSPITAL_COMMUNITY): Payer: PRIVATE HEALTH INSURANCE

## 2016-11-06 DIAGNOSIS — M6281 Muscle weakness (generalized): Secondary | ICD-10-CM | POA: Diagnosis not present

## 2016-11-06 DIAGNOSIS — R29898 Other symptoms and signs involving the musculoskeletal system: Secondary | ICD-10-CM

## 2016-11-06 DIAGNOSIS — R293 Abnormal posture: Secondary | ICD-10-CM

## 2016-11-06 DIAGNOSIS — R262 Difficulty in walking, not elsewhere classified: Secondary | ICD-10-CM

## 2016-11-06 NOTE — Therapy (Signed)
[place], [place], [address] Phone: [phone]   Fax:  [phone]  Physical Therapy Treatment  Patient Details  Name: Melissa Blackburn MRN: [id number] Date of Birth: 1955-08-29 Referring Provider: Phylliss Bob   Encounter Date: 11/06/2016      PT End of Session - 11/06/16 1133    Visit Number 2   Number of Visits 12   Date for PT Re-Evaluation 11/19/16   Authorization Type Worker's Comp (12 sessions approved as of 10/29/16)   Authorization Time Period 10/29/16 to 12/10/16   Authorization - Visit Number 2   Authorization - Number of Visits 12   PT Start Time 0962   PT Stop Time 1135   PT Time Calculation (min) 50 min   Activity Tolerance Patient tolerated treatment well   Behavior During Therapy [facility] for tasks assessed/performed      Past Medical History:  Diagnosis Date  . Anemia    hx of  . Asthma    "adult onset" (09/11/2016)  . DDD (degenerative disc disease)    "hands, knees" (09/11/2016)  . Hypertension   . Migraine    "none since stimulator placed 07/2016" (09/11/2016)  . Needle stick injury with contaminated needle    patient undergoing testing  . Numbness and tingling 11/15/2012  . OSA on CPAP    cpap 7 (09/11/2016)  . Pain in limb 11/15/2012  . Paresthesias 11/15/2012  . Pneumonia 1980s X 1  . PONV (postoperative nausea and vomiting)    "severe nausea and vomiting"  . Weakness generalized 11/15/2012    Past Surgical History:  Procedure Laterality Date  . BACK SURGERY    . ESOPHAGOGASTRODUODENOSCOPY (EGD) WITH PROPOFOL N/A 01/10/2016   Procedure: ESOPHAGOGASTRODUODENOSCOPY (EGD) WITH PROPOFOL;  Surgeon: Milus Banister, MD;  Location: [facility];  Service: Endoscopy;  Laterality: N/A;  . KNEE ARTHROSCOPY  04/23/2012   Procedure: ARTHROSCOPY KNEE;  Surgeon: Augustin Schooling, MD;  Location: [place];  Service: Orthopedics;  Laterality: Left;  lateral meniscectomy  . KNEE ARTHROSCOPY Bilateral    "bone  fragments"  . KNEE ARTHROSCOPY W/ ACL RECONSTRUCTION Left 2009  . KNEE ARTHROSCOPY W/ MENISCAL REPAIR Left 1981  . KNEE ARTHROSCOPY W/ MENISCAL REPAIR Left 2012  . LUMBAR FUSION Left 09/11/2016   LUMBAR 4-5 TRANSFORAMINAL LUMBAR INTERBODY FUSION WITH INSTRUMENTATION AND Gertha Calkin Archie Endo 09/11/2016  . PERIPHERAL NERVE STIMULATOR Right 07/2016   "in my chest; to treat migraine headaches"  . RECTOCELE REPAIR    . SHOULDER ARTHROSCOPY W/ SUPERIOR LABRAL ANTERIOR POSTERIOR REPAIR Bilateral    "I have rivets in both shoulders to hold them together" (09/11/2016)  . SHOULDER ARTHROSCOPY WITH SUBACROMIAL DECOMPRESSION Right 1994 X 2; 1995; ~ 2010  . UPPER ESOPHAGEAL ENDOSCOPIC ULTRASOUND (EUS)  01/10/2016   Procedure: UPPER ESOPHAGEAL ENDOSCOPIC ULTRASOUND (EUS);  Surgeon: Milus Banister, MD;  Location: [facility];  Service: Endoscopy;;  . VAGINAL HYSTERECTOMY  1986   "partial"    There were no vitals filed for this visit.      Subjective Assessment - 11/06/16 1046    Subjective Pt states she is a little sore this morning in her back and her L hip. She states she feels like her L hip pain has improved slightly but it depends on how long she has to sit.   Pertinent History DDD, permanent nerve stimular implant for migranes, multiple knee surgeries, lumbar fusion 09/11/16   How long can you sit comfortably? 3/21- 30 minutes  How long can you stand comfortably? 3/21- 30 minutes at best    How long can you walk comfortably? 3/21- household distances are fine, but stairs are difficult. Pain increases with extended walking.    Patient Stated Goals get rid of pain, get back to outdoor activities like hunting, get back to PLOF    Currently in Pain? Yes   Pain Score 3    Pain Location --  hip and back   Pain Orientation Left   Pain Descriptors / Indicators Aching   Pain Type Chronic pain   Pain Onset More than a month ago            [facility] Adult PT Treatment/Exercise - 11/06/16 0001       Ambulation/Gait   Ambulation Distance (Feet) 452 Feet   Assistive device None   Gait Pattern Step-through pattern;Antalgic  increased L anterior pelvic rotation, decreased hip ext on L   Gait Comments antalic on L     Lumbar Exercises: Standing   Other Standing Lumbar Exercises sit <> stand from table, 2x10 with no UE support; bil side stepping 46ft x 3 laps (pt felt some discomfort in L hip but no increase in pain); standing hip abd and ext 2x10 each with BUE support; BLE toe taps on 2" step (too easy) x 3 and 2x10 on 4" step with 1 UE support     Lumbar Exercises: Seated   Hip Flexion on Ball Limitations seated hip flexion with RTB (on mat table), 3x10 each, maintaining neutral spine     Lumbar Exercises: Supine   Clam 10 reps  RTB   Clam Limitations 2 sets, seated   Other Supine Lumbar Exercises seated adductor squeezes with ball, 2x10     Lumbar Exercises: Sidelying   Clam 10 reps  2 sets, sidelying, no resistance     Modalities   Modalities Cryotherapy     Cryotherapy   Number Minutes Cryotherapy 10 Minutes  unbilled   Cryotherapy Location Hip  L greater trochanter area   Type of Cryotherapy Ice massage             PT Education - 11/06/16 1132    Education provided Yes   Education Details reviewed goals, HEP, will continue POC as planned within lumbar fusion precautions and to tolerance of L hip pain   Person(s) Educated Patient   Methods Explanation;Demonstration   Comprehension Verbalized understanding;Returned demonstration          PT Short Term Goals - 10/29/16 1359      PT SHORT TERM GOAL #1   Title Patient to experience pain no more than 2/10 during all functional tasks and activities in order to improve QOL and functional task performance    Time 3   Period Weeks   Status New     PT SHORT TERM GOAL #2   Title Patient to be able to complete at least 60% of walking program recommended by MD without pain exacerbation in order to assist in adhering  to post-op recommendations and to improve  recovery    Time 3   Period Weeks   Status New     PT SHORT TERM GOAL #3   Title Patient to be independent in correctly and consistently performing targeted HEP, to be updated as appropriate    Time 3   Period Weeks   Status New           PT Long Term Goals - 10/29/16 1401  PT LONG TERM GOAL #1   Title Patient to demonstrate functional strength 5/5 in all tested muscle groups in order to assist in reducing pain and facilitating return to PLOF    Time 6   Period Weeks   Status New     PT LONG TERM GOAL #2   Title Patient to be able to complete 100% of MD recommended walking program without pain exacerbation in order to allow her to follow post-op recommendations and to aide recovery    Time 6   Period Weeks   Status New     PT LONG TERM GOAL #3   Title Patient to report she has been able to sleep through the night without waking due to pain in order to improve overall QOL    Time 6   Period Weeks   Status New     PT LONG TERM GOAL #4   Title Patient to demonstrate improved gait mechanics including elimination of antalgic pattern and L Hip hike in order to reduce pain and improve efficiency of overall mobility    Time 6   Period Weeks   Status New               Plan - 11/06/16 1133    Clinical Impression Statement Pt presents with continued L hip pain and antalgic gait. Pt responded well to ice massage at beginning of session and reported decreased pain (unbilled). She tolerated light therex (within lumbar precautions) well this date but did have some increased discomfort with side stepping and demonstrated fatigue towards EOS. Pt's pain slightly elevated at EOS so finished treatment with ice pack x 10 mins (unbilled) and following pt reported decreased pain to 1/10. Continue current POC   Rehab Potential Fair   Clinical Impairments Affecting Rehab Potential (+) motivated to participate in skilled PT services, high  PLOF; (-) extensive lumbar surgeries, circumstances leading to difficulty with pain control   PT Frequency 2x / week   PT Duration 6 weeks   PT Treatment/Interventions ADLs/Self Care Home Management;Biofeedback;Cryotherapy;Moist Heat;Gait training;Stair training;Functional mobility training;Therapeutic activities;Therapeutic exercise;Balance training;Neuromuscular re-education;Patient/family education;Manual techniques;Passive range of motion;Energy conservation;Taping   PT Next Visit Plan PER MD- NO BENDING, LIFTING, TWISTING, SQUATTING, STRAINING, ETC. Cautious strengthening of proximal musculature, continueice massage to L hip. Gait training. Continue hip flexion strengthening and hip abd strengthening carefully within lumbar precautions. Perform STM to L hip greater trochanter area   PT Home Exercise Plan 3/21- seated marches with neutral spine, seated clams, sit to stand with no UEs (low reps)   Consulted and Agree with Plan of Care Patient      Patient will benefit from skilled therapeutic intervention in order to improve the following deficits and impairments:  Abnormal gait, Improper body mechanics, Pain, Decreased coordination, Decreased mobility, Decreased activity tolerance, Decreased strength, Difficulty walking  Visit Diagnosis: Muscle weakness (generalized)  Abnormal posture  Other symptoms and signs involving the musculoskeletal system  Difficulty in walking, not elsewhere classified     Problem List Patient Active Problem List   Diagnosis Date Noted  . Radiculopathy 09/11/2016  . Weakness generalized 11/15/2012  . Paresthesias 11/15/2012  . Pain in limb 11/15/2012  . Numbness and tingling 11/15/2012     Geraldine Solar PT, DPT    [facility] [address] [place], [place], [address] Phone: [phone]   Fax:  [phone]  Name: Melissa Blackburn MRN: [id number] Date of Birth: Apr 27, 1956

## 2016-11-11 ENCOUNTER — Ambulatory Visit (HOSPITAL_COMMUNITY): Payer: PRIVATE HEALTH INSURANCE | Attending: Orthopedic Surgery

## 2016-11-11 DIAGNOSIS — R262 Difficulty in walking, not elsewhere classified: Secondary | ICD-10-CM | POA: Insufficient documentation

## 2016-11-11 DIAGNOSIS — M6281 Muscle weakness (generalized): Secondary | ICD-10-CM | POA: Insufficient documentation

## 2016-11-11 DIAGNOSIS — R293 Abnormal posture: Secondary | ICD-10-CM | POA: Insufficient documentation

## 2016-11-11 DIAGNOSIS — R29898 Other symptoms and signs involving the musculoskeletal system: Secondary | ICD-10-CM | POA: Diagnosis present

## 2016-11-11 NOTE — Therapy (Signed)
[place], [place], [address] Phone: [phone]   Fax:  [phone]  Physical Therapy Treatment  Patient Details  Name: Melissa Blackburn MRN: [id number] Date of Birth: 12-12-1955 Referring Provider: Phylliss Bob  Encounter Date: 11/11/2016      PT End of Session - 11/11/16 1047    Visit Number 3   Number of Visits 12   Date for PT Re-Evaluation 11/19/16  MD apt on 4/13   Authorization Type Worker's Comp (12 sessions approved as of 10/29/16)   Authorization Time Period 10/29/16 to 12/10/16   Authorization - Visit Number 3   Authorization - Number of Visits 12   PT Start Time 1034   PT Stop Time 1116   PT Time Calculation (min) 42 min   Activity Tolerance Patient tolerated treatment well;No increased pain   Behavior During Therapy WFL for tasks assessed/performed      Past Medical History:  Diagnosis Date  . Anemia    hx of  . Asthma    "adult onset" (09/11/2016)  . DDD (degenerative disc disease)    "hands, knees" (09/11/2016)  . Hypertension   . Migraine    "none since stimulator placed 07/2016" (09/11/2016)  . Needle stick injury with contaminated needle    patient undergoing testing  . Numbness and tingling 11/15/2012  . OSA on CPAP    cpap 7 (09/11/2016)  . Pain in limb 11/15/2012  . Paresthesias 11/15/2012  . Pneumonia 1980s X 1  . PONV (postoperative nausea and vomiting)    "severe nausea and vomiting"  . Weakness generalized 11/15/2012    Past Surgical History:  Procedure Laterality Date  . BACK SURGERY    . ESOPHAGOGASTRODUODENOSCOPY (EGD) WITH PROPOFOL N/A 01/10/2016   Procedure: ESOPHAGOGASTRODUODENOSCOPY (EGD) WITH PROPOFOL;  Surgeon: Milus Banister, MD;  Location: [facility];  Service: Endoscopy;  Laterality: N/A;  . KNEE ARTHROSCOPY  04/23/2012   Procedure: ARTHROSCOPY KNEE;  Surgeon: Augustin Schooling, MD;  Location: [facility];  Service: Orthopedics;  Laterality: Left;  lateral meniscectomy  . KNEE  ARTHROSCOPY Bilateral    "bone fragments"  . KNEE ARTHROSCOPY W/ ACL RECONSTRUCTION Left 2009  . KNEE ARTHROSCOPY W/ MENISCAL REPAIR Left 1981  . KNEE ARTHROSCOPY W/ MENISCAL REPAIR Left 2012  . LUMBAR FUSION Left 09/11/2016   LUMBAR 4-5 TRANSFORAMINAL LUMBAR INTERBODY FUSION WITH INSTRUMENTATION AND Gertha Calkin Archie Endo 09/11/2016  . PERIPHERAL NERVE STIMULATOR Right 07/2016   "in my chest; to treat migraine headaches"  . RECTOCELE REPAIR    . SHOULDER ARTHROSCOPY W/ SUPERIOR LABRAL ANTERIOR POSTERIOR REPAIR Bilateral    "I have rivets in both shoulders to hold them together" (09/11/2016)  . SHOULDER ARTHROSCOPY WITH SUBACROMIAL DECOMPRESSION Right 1994 X 2; 1995; ~ 2010  . UPPER ESOPHAGEAL ENDOSCOPIC ULTRASOUND (EUS)  01/10/2016   Procedure: UPPER ESOPHAGEAL ENDOSCOPIC ULTRASOUND (EUS);  Surgeon: Milus Banister, MD;  Location: [facility];  Service: Endoscopy;;  . VAGINAL HYSTERECTOMY  1986   "partial"    There were no vitals filed for this visit.      Subjective Assessment - 11/11/16 1038    Subjective Pt stated she has increased dull soreness in lower back and Lt hip today.  Reports increased pain especially with sidestepping to Lt hip.   Pertinent History DDD, permanent nerve stimular implant for migranes, multiple knee surgeries, lumbar fusion 09/11/16   Patient Stated Goals get rid of pain, get back to outdoor activities like hunting, get back to [facility]  Currently in Pain? Yes   Pain Score 4    Pain Location Back  lower back and Lt hip   Pain Orientation Left;Lower   Pain Descriptors / Indicators Aching;Dull;Sore   Pain Type Chronic pain   Pain Radiating Towards radiated down into groin and lateral thigh   Pain Onset More than a month ago   Pain Frequency Constant   Aggravating Factors  walking too long, stairs, pressure on the area   Pain Relieving Factors ice   Effect of Pain on Daily Activities severe impact            [facility] PT Assessment - 11/11/16 0001       Assessment   Medical Diagnosis lumbar fusion    Referring Provider Phylliss Bob   Onset Date/Surgical Date 09/11/16   Next MD Visit Dr. Lynann Bologna April 13th                      [facility] Adult PT Treatment/Exercise - 11/11/16 0001      Lumbar Exercises: Standing   Other Standing Lumbar Exercises standing abd and ext 2x10 reps wiht cueing to reduce ER     Lumbar Exercises: Seated   Hip Flexion on Ball Limitations sit to stand 2x 10 cueing for equal weight bearing with     Lumbar Exercises: Supine   Clam 10 reps  RTB   Clam Limitations 2 sets supine   Bent Knee Raise 10 reps   Bent Knee Raise Limitations 2 sets with diaphragmatic breathing for core activation   Other Supine Lumbar Exercises seated adductor squeezes with ball, 2x10     Lumbar Exercises: Sidelying   Clam 10 reps  RTB 2 sets     Modalities   Modalities Cryotherapy     Cryotherapy   Number Minutes Cryotherapy 6 Minutes   Cryotherapy Location Hip  Lt greater trochanter area in Rt S/L with pillow between kne   Type of Cryotherapy Ice massage                  PT Short Term Goals - 10/29/16 1359      PT SHORT TERM GOAL #1   Title Patient to experience pain no more than 2/10 during all functional tasks and activities in order to improve QOL and functional task performance    Time 3   Period Weeks   Status New     PT SHORT TERM GOAL #2   Title Patient to be able to complete at least 60% of walking program recommended by MD without pain exacerbation in order to assist in adhering to post-op recommendations and to improve  recovery    Time 3   Period Weeks   Status New     PT SHORT TERM GOAL #3   Title Patient to be independent in correctly and consistently performing targeted HEP, to be updated as appropriate    Time 3   Period Weeks   Status New           PT Long Term Goals - 10/29/16 1401      PT LONG TERM GOAL #1   Title Patient to demonstrate functional strength 5/5 in all  tested muscle groups in order to assist in reducing pain and facilitating return to PLOF    Time 6   Period Weeks   Status New     PT LONG TERM GOAL #2   Title Patient to be able to complete 100% of MD recommended walking  program without pain exacerbation in order to allow her to follow post-op recommendations and to aide recovery    Time 6   Period Weeks   Status New     PT LONG TERM GOAL #3   Title Patient to report she has been able to sleep through the night without waking due to pain in order to improve overall QOL    Time 6   Period Weeks   Status New     PT LONG TERM GOAL #4   Title Patient to demonstrate improved gait mechanics including elimination of antalgic pattern and L Hip hike in order to reduce pain and improve efficiency of overall mobility    Time 6   Period Weeks   Status New               Plan - 11/11/16 1112    Clinical Impression Statement Pt reports increased pain with Lt LE weight bearing especially with abduction movements.  Pt able to verbalize appropriate precautions with lumbar fusion, reports compliance with HEP and feels confident with self ice massages.  Session foucs on improving proximal strengthening especially flexion and abduction.  Pt reports slight increase in pain following sidelying exercise, ice massage complete to assist with pain with improved tolerance for therex following.  Added resistance with clam for strengthening with minimal difficulty.  No reports of increased pain at EOS.     Clinical Impairments Affecting Rehab Potential (+) motivated to participate in skilled PT services, high PLOF; (-) extensive lumbar surgeries, circumstances leading to difficulty with pain control   PT Frequency 2x / week   PT Duration 6 weeks   PT Treatment/Interventions ADLs/Self Care Home Management;Biofeedback;Cryotherapy;Moist Heat;Gait training;Stair training;Functional mobility training;Therapeutic activities;Therapeutic exercise;Balance  training;Neuromuscular re-education;Patient/family education;Manual techniques;Passive range of motion;Energy conservation;Taping   PT Next Visit Plan PER MD- NO BENDING, LIFTING, TWISTING, SQUATTING, STRAINING, ETC. Cautious strengthening of proximal musculature, continueice massage to L hip. Gait training. Continue hip flexion strengthening and hip abd strengthening carefully within lumbar precautions. Perform STM to L hip greater trochanter area   PT Home Exercise Plan 3/21- seated marches with neutral spine, seated clams, sit to stand with no UEs (low reps)      Patient will benefit from skilled therapeutic intervention in order to improve the following deficits and impairments:  Abnormal gait, Improper body mechanics, Pain, Decreased coordination, Decreased mobility, Decreased activity tolerance, Decreased strength, Difficulty walking  Visit Diagnosis: Muscle weakness (generalized)  Abnormal posture  Other symptoms and signs involving the musculoskeletal system  Difficulty in walking, not elsewhere classified     Problem List Patient Active Problem List   Diagnosis Date Noted  . Radiculopathy 09/11/2016  . Weakness generalized 11/15/2012  . Paresthesias 11/15/2012  . Pain in limb 11/15/2012  . Numbness and tingling 11/15/2012   Ihor Austin, LPTA; CBIS [phone]  Aldona Lento 11/11/2016, 3:37 PM  [facility] [address] [place], [place], [address] Phone: [phone]   Fax:  [phone]  Name: Melissa Blackburn MRN: [id number] Date of Birth: Feb 16, 1956

## 2016-11-13 ENCOUNTER — Ambulatory Visit (HOSPITAL_COMMUNITY): Payer: PRIVATE HEALTH INSURANCE | Admitting: Physical Therapy

## 2016-11-13 DIAGNOSIS — R262 Difficulty in walking, not elsewhere classified: Secondary | ICD-10-CM

## 2016-11-13 DIAGNOSIS — M6281 Muscle weakness (generalized): Secondary | ICD-10-CM | POA: Diagnosis not present

## 2016-11-13 DIAGNOSIS — R29898 Other symptoms and signs involving the musculoskeletal system: Secondary | ICD-10-CM

## 2016-11-13 DIAGNOSIS — R293 Abnormal posture: Secondary | ICD-10-CM

## 2016-11-13 NOTE — Therapy (Signed)
[place], [place], [address] Phone: [phone]   Fax:  [phone]  Physical Therapy Treatment  Patient Details  Name: Melissa Blackburn MRN: [id number] Date of Birth: Dec 11, 1955 Referring Provider: Phylliss Bob  Encounter Date: 11/13/2016      PT End of Session - 11/13/16 1244    Visit Number 4   Number of Visits 12   Date for PT Re-Evaluation 11/19/16  MD apt on 4/13   Authorization Type Worker's Comp (12 sessions approved as of 10/29/16)   Authorization Time Period 10/29/16 to 12/10/16   Authorization - Visit Number 4   Authorization - Number of Visits 12   PT Start Time 6962   PT Stop Time 1115   PT Time Calculation (min) 43 min   Activity Tolerance Patient tolerated treatment well;No increased pain   Behavior During Therapy WFL for tasks assessed/performed      Past Medical History:  Diagnosis Date  . Anemia    hx of  . Asthma    "adult onset" (09/11/2016)  . DDD (degenerative disc disease)    "hands, knees" (09/11/2016)  . Hypertension   . Migraine    "none since stimulator placed 07/2016" (09/11/2016)  . Needle stick injury with contaminated needle    patient undergoing testing  . Numbness and tingling 11/15/2012  . OSA on CPAP    cpap 7 (09/11/2016)  . Pain in limb 11/15/2012  . Paresthesias 11/15/2012  . Pneumonia 1980s X 1  . PONV (postoperative nausea and vomiting)    "severe nausea and vomiting"  . Weakness generalized 11/15/2012    Past Surgical History:  Procedure Laterality Date  . BACK SURGERY    . ESOPHAGOGASTRODUODENOSCOPY (EGD) WITH PROPOFOL N/A 01/10/2016   Procedure: ESOPHAGOGASTRODUODENOSCOPY (EGD) WITH PROPOFOL;  Surgeon: Milus Banister, MD;  Location: [facility];  Service: Endoscopy;  Laterality: N/A;  . KNEE ARTHROSCOPY  04/23/2012   Procedure: ARTHROSCOPY KNEE;  Surgeon: Augustin Schooling, MD;  Location: [facility];  Service: Orthopedics;  Laterality: Left;  lateral meniscectomy  . KNEE  ARTHROSCOPY Bilateral    "bone fragments"  . KNEE ARTHROSCOPY W/ ACL RECONSTRUCTION Left 2009  . KNEE ARTHROSCOPY W/ MENISCAL REPAIR Left 1981  . KNEE ARTHROSCOPY W/ MENISCAL REPAIR Left 2012  . LUMBAR FUSION Left 09/11/2016   LUMBAR 4-5 TRANSFORAMINAL LUMBAR INTERBODY FUSION WITH INSTRUMENTATION AND Gertha Calkin Archie Endo 09/11/2016  . PERIPHERAL NERVE STIMULATOR Right 07/2016   "in my chest; to treat migraine headaches"  . RECTOCELE REPAIR    . SHOULDER ARTHROSCOPY W/ SUPERIOR LABRAL ANTERIOR POSTERIOR REPAIR Bilateral    "I have rivets in both shoulders to hold them together" (09/11/2016)  . SHOULDER ARTHROSCOPY WITH SUBACROMIAL DECOMPRESSION Right 1994 X 2; 1995; ~ 2010  . UPPER ESOPHAGEAL ENDOSCOPIC ULTRASOUND (EUS)  01/10/2016   Procedure: UPPER ESOPHAGEAL ENDOSCOPIC ULTRASOUND (EUS);  Surgeon: Milus Banister, MD;  Location: [facility];  Service: Endoscopy;;  . VAGINAL HYSTERECTOMY  1986   "partial"    There were no vitals filed for this visit.      Subjective Assessment - 11/13/16 1035    Subjective Pt reports that she was very sore 2 days ago following being on her feet while cooking for a church member. She is not quite as sore today. Her HEP is going well but she has not been doing her side stepping though because this really makes her Lt hip flare up.    Pertinent History DDD, permanent nerve stimular  implant for migranes, multiple knee surgeries, lumbar fusion 09/11/16   Patient Stated Goals get rid of pain, get back to outdoor activities like hunting, get back to PLOF    Currently in Pain? Yes   Pain Score 5    Pain Location --  low back and Lt hip   Pain Descriptors / Indicators Aching;Sore   Pain Type Chronic pain   Pain Radiating Towards none at this time    Pain Onset More than a month ago   Pain Frequency Constant   Aggravating Factors  being on her feet too long.    Pain Relieving Factors ice    Effect of Pain on Daily Activities up on her feet 2-3 hours                           [facility] Adult PT Treatment/Exercise - 11/13/16 0001      Lumbar Exercises: Standing   Other Standing Lumbar Exercises tandem stance hold with each LE forwad 3x30 sec each; side stepping over 12" hurdle 2x10 reps each Lt/Rt  Standing hip adductor stretch in // bars 2x30 sec each      Lumbar Exercises: Supine   Clam 10 reps   Clam Limitations x2 sets with green TB    Other Supine Lumbar Exercises Lt bent knee fall out 10x5 sec      Manual Therapy   Manual Therapy Soft tissue mobilization   Manual therapy comments separate rest of session   Soft tissue mobilization Lt adductors along proximal/mid/distal portions                 PT Education - 11/13/16 1244    Education provided Yes   Education Details implications for manual techniques to address Lt hip adductor spasm; functional causes of hip adductor over use   Person(s) Educated Patient   Methods Explanation   Comprehension Verbalized understanding          PT Short Term Goals - 10/29/16 1359      PT SHORT TERM GOAL #1   Title Patient to experience pain no more than 2/10 during all functional tasks and activities in order to improve QOL and functional task performance    Time 3   Period Weeks   Status New     PT SHORT TERM GOAL #2   Title Patient to be able to complete at least 60% of walking program recommended by MD without pain exacerbation in order to assist in adhering to post-op recommendations and to improve  recovery    Time 3   Period Weeks   Status New     PT SHORT TERM GOAL #3   Title Patient to be independent in correctly and consistently performing targeted HEP, to be updated as appropriate    Time 3   Period Weeks   Status New           PT Long Term Goals - 10/29/16 1401      PT LONG TERM GOAL #1   Title Patient to demonstrate functional strength 5/5 in all tested muscle groups in order to assist in reducing pain and facilitating return to PLOF     Time 6   Period Weeks   Status New     PT LONG TERM GOAL #2   Title Patient to be able to complete 100% of MD recommended walking program without pain exacerbation in order to allow her to follow post-op recommendations and to aide  recovery    Time 6   Period Weeks   Status New     PT LONG TERM GOAL #3   Title Patient to report she has been able to sleep through the night without waking due to pain in order to improve overall QOL    Time 6   Period Weeks   Status New     PT LONG TERM GOAL #4   Title Patient to demonstrate improved gait mechanics including elimination of antalgic pattern and L Hip hike in order to reduce pain and improve efficiency of overall mobility    Time 6   Period Weeks   Status New               Plan - 11/13/16 1245    Clinical Impression Statement Pt arrived today with increased soreness following a busy day cooking earlier this week. Session focused on continuation of hip strengthening and flexibility exercises within post op guidelines. Pt demonstrates compensator patterns during ambulation, resulting in over use of the Lt hip adductors. This is contributing to her pain, so therapist addressed this with soft tissue massage prior to the start of today's other activities. Pt did report improvements in discomfort along this region following the treatment and was able to gain increased stretch to the area. Ended session without increase in pain, pt encouraged to continue with previous HEP. There were no further concerns or questions.   Clinical Impairments Affecting Rehab Potential (+) motivated to participate in skilled PT services, high PLOF; (-) extensive lumbar surgeries, circumstances leading to difficulty with pain control   PT Frequency 2x / week   PT Duration 6 weeks   PT Treatment/Interventions ADLs/Self Care Home Management;Biofeedback;Cryotherapy;Moist Heat;Gait training;Stair training;Functional mobility training;Therapeutic activities;Therapeutic  exercise;Balance training;Neuromuscular re-education;Patient/family education;Manual techniques;Passive range of motion;Energy conservation;Taping   PT Next Visit Plan PER MD- NO BENDING, LIFTING, TWISTING, SQUATTING, STRAINING, ETC. Cautious strengthening of proximal musculature, soft tissue work along Lt hip adductors. hip extensor/ER activation in positions allowed by post op precautions.    PT Home Exercise Plan 3/21- seated marches with neutral spine, seated clams, sit to stand with no UEs (low reps)   Consulted and Agree with Plan of Care Patient      Patient will benefit from skilled therapeutic intervention in order to improve the following deficits and impairments:  Abnormal gait, Improper body mechanics, Pain, Decreased coordination, Decreased mobility, Decreased activity tolerance, Decreased strength, Difficulty walking  Visit Diagnosis: Abnormal posture  Muscle weakness (generalized)  Difficulty in walking, not elsewhere classified  Other symptoms and signs involving the musculoskeletal system     Problem List Patient Active Problem List   Diagnosis Date Noted  . Radiculopathy 09/11/2016  . Weakness generalized 11/15/2012  . Paresthesias 11/15/2012  . Pain in limb 11/15/2012  . Numbness and tingling 11/15/2012   12:51 PM,11/13/16 Elly Modena PT, DPT [facility] [address] [place], [place], [address] Phone: [phone]   Fax:  [phone]  Name: Melissa Blackburn MRN: [id number] Date of Birth: 1956/02/01

## 2016-11-18 ENCOUNTER — Ambulatory Visit (HOSPITAL_COMMUNITY): Payer: PRIVATE HEALTH INSURANCE | Admitting: Physical Therapy

## 2016-11-18 DIAGNOSIS — R29898 Other symptoms and signs involving the musculoskeletal system: Secondary | ICD-10-CM

## 2016-11-18 DIAGNOSIS — R262 Difficulty in walking, not elsewhere classified: Secondary | ICD-10-CM

## 2016-11-18 DIAGNOSIS — M6281 Muscle weakness (generalized): Secondary | ICD-10-CM

## 2016-11-18 DIAGNOSIS — R293 Abnormal posture: Secondary | ICD-10-CM

## 2016-11-18 NOTE — Patient Instructions (Signed)
HIP ADDUCTOR STRETCH (TWO VERSIONS)   Standing:           Laying down:          Grasp the subject's leg by holding under the thigh and ankle, then gently pull leg outwards until a stretch is felt on the inside of your leg.   Hold for 30 seconds and then relax. Repeat other side.  Repeat 3 times each side, 1-2 times per day.     TOTAL GYM  Make sure you are using an incline that is comfortable and does not cause hip pain, but still challenges your leg muscles.  Repeat 10-15 times, 1-2 times per day.    NUSTEP  You can access this at the [facility] or local senior center (Free for residents of [employer]).  Make sure you stop when the left hip starts to get sore, NOT WHEN IT HAS ALREADY BECOME PAINFUL.

## 2016-11-18 NOTE — Therapy (Signed)
[place], [place], [address] Phone: [phone]   Fax:  [phone]  Physical Therapy Treatment (Re-assessment)  Patient Details  Name: Melissa Blackburn MRN: [id number] Date of Birth: 1956/07/15 Referring Provider: Phylliss Bob  Encounter Date: 11/18/2016      PT End of Session - 11/18/16 1128    Visit Number 5   Number of Visits 12   Date for PT Re-Evaluation 12/16/16   Authorization Type Worker's Comp (12 sessions approved as of 10/29/16)   Authorization Time Period 10/29/16 to 12/10/16   Authorization - Visit Number 5   Authorization - Number of Visits 12   PT Start Time 6962   PT Stop Time 1117   PT Time Calculation (min) 45 min   Activity Tolerance Patient tolerated treatment well   Behavior During Therapy [facility] for tasks assessed/performed      Past Medical History:  Diagnosis Date  . Anemia    hx of  . Asthma    "adult onset" (09/11/2016)  . DDD (degenerative disc disease)    "hands, knees" (09/11/2016)  . Hypertension   . Migraine    "none since stimulator placed 07/2016" (09/11/2016)  . Needle stick injury with contaminated needle    patient undergoing testing  . Numbness and tingling 11/15/2012  . OSA on CPAP    cpap 7 (09/11/2016)  . Pain in limb 11/15/2012  . Paresthesias 11/15/2012  . Pneumonia 1980s X 1  . PONV (postoperative nausea and vomiting)    "severe nausea and vomiting"  . Weakness generalized 11/15/2012    Past Surgical History:  Procedure Laterality Date  . BACK SURGERY    . ESOPHAGOGASTRODUODENOSCOPY (EGD) WITH PROPOFOL N/A 01/10/2016   Procedure: ESOPHAGOGASTRODUODENOSCOPY (EGD) WITH PROPOFOL;  Surgeon: Milus Banister, MD;  Location: [facility];  Service: Endoscopy;  Laterality: N/A;  . KNEE ARTHROSCOPY  04/23/2012   Procedure: ARTHROSCOPY KNEE;  Surgeon: Augustin Schooling, MD;  Location: [place];  Service: Orthopedics;  Laterality: Left;  lateral meniscectomy  . KNEE ARTHROSCOPY Bilateral     "bone fragments"  . KNEE ARTHROSCOPY W/ ACL RECONSTRUCTION Left 2009  . KNEE ARTHROSCOPY W/ MENISCAL REPAIR Left 1981  . KNEE ARTHROSCOPY W/ MENISCAL REPAIR Left 2012  . LUMBAR FUSION Left 09/11/2016   LUMBAR 4-5 TRANSFORAMINAL LUMBAR INTERBODY FUSION WITH INSTRUMENTATION AND Gertha Calkin Archie Endo 09/11/2016  . PERIPHERAL NERVE STIMULATOR Right 07/2016   "in my chest; to treat migraine headaches"  . RECTOCELE REPAIR    . SHOULDER ARTHROSCOPY W/ SUPERIOR LABRAL ANTERIOR POSTERIOR REPAIR Bilateral    "I have rivets in both shoulders to hold them together" (09/11/2016)  . SHOULDER ARTHROSCOPY WITH SUBACROMIAL DECOMPRESSION Right 1994 X 2; 1995; ~ 2010  . UPPER ESOPHAGEAL ENDOSCOPIC ULTRASOUND (EUS)  01/10/2016   Procedure: UPPER ESOPHAGEAL ENDOSCOPIC ULTRASOUND (EUS);  Surgeon: Milus Banister, MD;  Location: [facility];  Service: Endoscopy;;  . VAGINAL HYSTERECTOMY  1986   "partial"    There were no vitals filed for this visit.      Subjective Assessment - 11/18/16 1033    Subjective Patient arrives stating that she feels she is improving working with PT; she can do about a mile but after that her hip starts to hurt a lot. She is sore in her back but does notice that her legs are stronger than they were.  She feels her gains are due to a combination of PT and walking. Her hips have been flaring up when sidestepping.  Pertinent History DDD, permanent nerve stimular implant for migranes, multiple knee surgeries, lumbar fusion 09/11/16   How long can you sit comfortably? 4/10- 45 minutes to 60 minutes    How long can you stand comfortably? 4/10- 30 minutes    How long can you walk comfortably? 4/10- up to a mile    Patient Stated Goals get rid of pain, get back to outdoor activities like hunting, get back to PLOF    Currently in Pain? Yes   Pain Score 3    Pain Location Other (Comment)  low back and left hip    Pain Orientation Left;Lower   Pain Descriptors / Indicators Aching   Pain Type  Chronic pain   Pain Radiating Towards none    Pain Onset More than a month ago   Pain Frequency Constant   Aggravating Factors  being on her feet, laying on L side    Pain Relieving Factors ice    Effect of Pain on Daily Activities severe impact             [facility] PT Assessment - 11/18/16 0001      Strength   Right Hip Flexion 4+/5   Right Hip ABduction 4+/5   Left Hip Flexion 4/5   Left Hip ABduction 4-/5   Right Knee Flexion 5/5   Right Knee Extension 5/5   Left Knee Flexion 5/5   Left Knee Extension 5/5   Right Ankle Dorsiflexion 5/5   Left Ankle Dorsiflexion 5/5     Flexibility   Hamstrings WFL    Piriformis WFL      Transfers   Five time sit to stand comments  10.65 no UEs      6 minute walk test results    Aerobic Endurance Distance Walked 820   Endurance additional comments 3MWT      High Level Balance   High Level Balance Comments TUG 6.8 no device                             PT Education - 11/18/16 1128    Education provided Yes   Education Details POC moving forward, progress but unsure if this is due to PT intervention or routine healing. Safety and form on total gym and Nustep. HEP and activity recommendations. Return in 2 weeks, call if she has any acute problems.    Person(s) Educated Patient   Methods Explanation   Comprehension Verbalized understanding          PT Short Term Goals - 11/18/16 1047      PT SHORT TERM GOAL #1   Title Patient to experience pain no more than 2/10 during all functional tasks and activities in order to improve QOL and functional task performance    Baseline 4/10- pain can still get to 5-6/10   Time 3   Period Weeks   Status On-going     PT SHORT TERM GOAL #2   Title Patient to be able to complete at least 60% of walking program recommended by MD without pain exacerbation in order to assist in adhering to post-op recommendations and to improve  recovery    Baseline 4/10- has improved greatly     Time 3   Period Weeks   Status Achieved     PT SHORT TERM GOAL #3   Title Patient to be independent in correctly and consistently performing targeted HEP, to be updated as appropriate  Baseline 4/10- compliant 2x/day    Time 3   Period Weeks   Status Achieved           PT Long Term Goals - 11/18/16 1048      PT LONG TERM GOAL #1   Title Patient to demonstrate functional strength 5/5 in all tested muscle groups in order to assist in reducing pain and facilitating return to PLOF    Baseline 4/10- much improved    Time 6   Period Weeks   Status Partially Met     PT LONG TERM GOAL #2   Title Patient to be able to complete 100% of MD recommended walking program without pain exacerbation in order to allow her to follow post-op recommendations and to aide recovery    Baseline 4/10- generally back on track    Time 6   Period Weeks   Status Achieved     PT LONG TERM GOAL #3   Title Patient to report she has been able to sleep through the night without waking due to pain in order to improve overall QOL    Baseline 4/10- still waking up due to pain especially if she rolls over onto the hip, also has had some muscle spams in back    Time 6   Period Weeks   Status On-going     PT LONG TERM GOAL #4   Title Patient to demonstrate improved gait mechanics including elimination of antalgic pattern and L Hip hike in order to reduce pain and improve efficiency of overall mobility    Time 6   Period Weeks   Status Achieved               Plan - 11/18/16 1129    Clinical Impression Statement Re-assessment performed today. Patient appears to have improved and shows improvements in all objective measures, however DPT unsure as to if improvements are due to skilled PT interventions or are due to routine healing process of lumbar surgery and hip bursitis. At this point, recommend trial of being put on hold with skilled PT services while she remains compliant with current HEP  (therband resistance updated), total gym at home, and Nustep at gym to tolerance. We will plan to see her in 2 weeks for follow-up to determine if she will be able to maintain current level and return to PT when she is able to tolerate higher level activities or if she will need continued skilled PT services to address hip pain/current problem.    Rehab Potential Fair   Clinical Impairments Affecting Rehab Potential (+) motivated to participate in skilled PT services, high PLOF; (-) extensive lumbar surgeries, circumstances leading to difficulty with pain control   PT Frequency 2x / week   PT Duration 3 weeks   PT Treatment/Interventions ADLs/Self Care Home Management;Biofeedback;Cryotherapy;Moist Heat;Gait training;Stair training;Functional mobility training;Therapeutic activities;Therapeutic exercise;Balance training;Neuromuscular re-education;Patient/family education;Manual techniques;Passive range of motion;Energy conservation;Taping   PT Next Visit Plan PER MD- NO BENDING, LIFTING, TWISTING, SQUATTING, STRAINING, ETC. Strength progrses as tolerated. STM to adductors. Ice massage to L hip. On hold until 4/24 to assess if she will be able to maintain/progrses herself at this point without pain exacerbation.    PT Home Exercise Plan 3/21- seated marches with neutral spine, seated clams, sit to stand with no UEs (low reps); 4/10: hip ADD stretch, total gym, Nustep    Consulted and Agree with Plan of Care Patient      Patient will benefit from skilled therapeutic intervention in order  to improve the following deficits and impairments:  Abnormal gait, Improper body mechanics, Pain, Decreased coordination, Decreased mobility, Decreased activity tolerance, Decreased strength, Difficulty walking  Visit Diagnosis: Abnormal posture  Muscle weakness (generalized)  Difficulty in walking, not elsewhere classified  Other symptoms and signs involving the musculoskeletal system     Problem  List Patient Active Problem List   Diagnosis Date Noted  . Radiculopathy 09/11/2016  . Weakness generalized 11/15/2012  . Paresthesias 11/15/2012  . Pain in limb 11/15/2012  . Numbness and tingling 11/15/2012    Deniece Ree PT, DPT [place] [address] [place], [place], [address] Phone: [phone]   Fax:  [phone]  Name: ASHELEY HELLBERG MRN: [id number] Date of Birth: 1955/10/15

## 2016-11-20 ENCOUNTER — Ambulatory Visit (HOSPITAL_COMMUNITY): Payer: PRIVATE HEALTH INSURANCE | Admitting: Physical Therapy

## 2016-11-24 ENCOUNTER — Other Ambulatory Visit (HOSPITAL_COMMUNITY): Payer: Self-pay | Admitting: Orthopedic Surgery

## 2016-11-24 DIAGNOSIS — M25552 Pain in left hip: Secondary | ICD-10-CM

## 2016-11-25 ENCOUNTER — Ambulatory Visit (HOSPITAL_COMMUNITY): Payer: PRIVATE HEALTH INSURANCE

## 2016-11-27 ENCOUNTER — Ambulatory Visit (HOSPITAL_COMMUNITY): Payer: PRIVATE HEALTH INSURANCE | Admitting: Physical Therapy

## 2016-11-28 ENCOUNTER — Ambulatory Visit (HOSPITAL_COMMUNITY)
Admission: RE | Admit: 2016-11-28 | Discharge: 2016-11-28 | Disposition: A | Discharge status: Home / Self Care | Payer: PRIVATE HEALTH INSURANCE | Source: Ambulatory Visit | Attending: Orthopedic Surgery | Admitting: Orthopedic Surgery

## 2016-11-28 DIAGNOSIS — R1909 Other intra-abdominal and pelvic swelling, mass and lump: Secondary | ICD-10-CM | POA: Diagnosis not present

## 2016-11-28 DIAGNOSIS — M5126 Other intervertebral disc displacement, lumbar region: Secondary | ICD-10-CM | POA: Diagnosis not present

## 2016-11-28 DIAGNOSIS — Z9889 Other specified postprocedural states: Secondary | ICD-10-CM | POA: Diagnosis not present

## 2016-11-28 DIAGNOSIS — Z90710 Acquired absence of both cervix and uterus: Secondary | ICD-10-CM | POA: Insufficient documentation

## 2016-11-28 DIAGNOSIS — M25552 Pain in left hip: Secondary | ICD-10-CM | POA: Diagnosis not present

## 2016-11-28 DIAGNOSIS — M5127 Other intervertebral disc displacement, lumbosacral region: Secondary | ICD-10-CM | POA: Diagnosis not present

## 2016-11-28 DIAGNOSIS — M1612 Unilateral primary osteoarthritis, left hip: Secondary | ICD-10-CM | POA: Insufficient documentation

## 2016-11-28 DIAGNOSIS — M545 Low back pain: Secondary | ICD-10-CM | POA: Diagnosis present

## 2016-11-28 DIAGNOSIS — M7989 Other specified soft tissue disorders: Secondary | ICD-10-CM | POA: Diagnosis not present

## 2016-12-02 ENCOUNTER — Telehealth (HOSPITAL_COMMUNITY): Payer: Self-pay | Admitting: Internal Medicine

## 2016-12-02 ENCOUNTER — Ambulatory Visit (HOSPITAL_COMMUNITY): Payer: PRIVATE HEALTH INSURANCE | Admitting: Physical Therapy

## 2016-12-02 ENCOUNTER — Telehealth (HOSPITAL_COMMUNITY): Payer: Self-pay | Admitting: Physical Therapy

## 2016-12-02 DIAGNOSIS — R293 Abnormal posture: Secondary | ICD-10-CM

## 2016-12-02 DIAGNOSIS — R29898 Other symptoms and signs involving the musculoskeletal system: Secondary | ICD-10-CM

## 2016-12-02 DIAGNOSIS — R262 Difficulty in walking, not elsewhere classified: Secondary | ICD-10-CM

## 2016-12-02 DIAGNOSIS — M6281 Muscle weakness (generalized): Secondary | ICD-10-CM | POA: Diagnosis not present

## 2016-12-02 NOTE — Telephone Encounter (Signed)
12/02/16  Pt cx some appts because she said that she was going to a hip specialist and would call us back once she saw him.  She didn't want to make any more appts until after that app.

## 2016-12-02 NOTE — Telephone Encounter (Signed)
Pt will see MD next Tuesday and let us know if she wants to continue with Hip Tx.  We did schedule 5 more Wed visits. NF 12/02/16

## 2016-12-02 NOTE — Therapy (Signed)
[place], [place], [address] Phone: [phone]   Fax:  [phone]  Physical Therapy Treatment  Patient Details  Name: Melissa Blackburn MRN: [id number] Date of Birth: 10-24-1955 Referring Provider: Phylliss Bob  Encounter Date: 12/02/2016      PT End of Session - 12/02/16 1130    Visit Number 6   Number of Visits 12   Date for PT Re-Evaluation 12/16/16   Authorization Type Worker's Comp (12 sessions approved as of 10/29/16)   Authorization Time Period 10/29/16 to 12/10/16   Authorization - Visit Number 6   Authorization - Number of Visits 12   PT Start Time 1036   PT Stop Time 1115   PT Time Calculation (min) 39 min   Activity Tolerance Patient tolerated treatment well   Behavior During Therapy [facility] for tasks assessed/performed      Past Medical History:  Diagnosis Date  . Anemia    hx of  . Asthma    "adult onset" (09/11/2016)  . DDD (degenerative disc disease)    "hands, knees" (09/11/2016)  . Hypertension   . Migraine    "none since stimulator placed 07/2016" (09/11/2016)  . Needle stick injury with contaminated needle    patient undergoing testing  . Numbness and tingling 11/15/2012  . OSA on CPAP    cpap 7 (09/11/2016)  . Pain in limb 11/15/2012  . Paresthesias 11/15/2012  . Pneumonia 1980s X 1  . PONV (postoperative nausea and vomiting)    "severe nausea and vomiting"  . Weakness generalized 11/15/2012    Past Surgical History:  Procedure Laterality Date  . BACK SURGERY    . ESOPHAGOGASTRODUODENOSCOPY (EGD) WITH PROPOFOL N/A 01/10/2016   Procedure: ESOPHAGOGASTRODUODENOSCOPY (EGD) WITH PROPOFOL;  Surgeon: Milus Banister, MD;  Location: [facility];  Service: Endoscopy;  Laterality: N/A;  . KNEE ARTHROSCOPY  04/23/2012   Procedure: ARTHROSCOPY KNEE;  Surgeon: Augustin Schooling, MD;  Location: [place];  Service: Orthopedics;  Laterality: Left;  lateral meniscectomy  . KNEE ARTHROSCOPY Bilateral    "bone  fragments"  . KNEE ARTHROSCOPY W/ ACL RECONSTRUCTION Left 2009  . KNEE ARTHROSCOPY W/ MENISCAL REPAIR Left 1981  . KNEE ARTHROSCOPY W/ MENISCAL REPAIR Left 2012  . LUMBAR FUSION Left 09/11/2016   LUMBAR 4-5 TRANSFORAMINAL LUMBAR INTERBODY FUSION WITH INSTRUMENTATION AND Gertha Calkin Archie Endo 09/11/2016  . PERIPHERAL NERVE STIMULATOR Right 07/2016   "in my chest; to treat migraine headaches"  . RECTOCELE REPAIR    . SHOULDER ARTHROSCOPY W/ SUPERIOR LABRAL ANTERIOR POSTERIOR REPAIR Bilateral    "I have rivets in both shoulders to hold them together" (09/11/2016)  . SHOULDER ARTHROSCOPY WITH SUBACROMIAL DECOMPRESSION Right 1994 X 2; 1995; ~ 2010  . UPPER ESOPHAGEAL ENDOSCOPIC ULTRASOUND (EUS)  01/10/2016   Procedure: UPPER ESOPHAGEAL ENDOSCOPIC ULTRASOUND (EUS);  Surgeon: Milus Banister, MD;  Location: [facility];  Service: Endoscopy;;  . VAGINAL HYSTERECTOMY  1986   "partial"    There were no vitals filed for this visit.      Subjective Assessment - 12/02/16 1142    Subjective Pt states her back is doing well it's just her hip that still bothering her.  States her surgeon issued her some steroids, completed CT scan for hip and mader her an appointment with hip specialist next Tuesday.  Reports she returns to surgeon on Friday.   Currently in Pain? Yes   Pain Score 3    Pain Location Hip   Pain Orientation Left  Pain Descriptors / Indicators Aching;Sore                         [facility] Adult PT Treatment/Exercise - 12/02/16 0001      Ambulation/Gait   Gait Comments amb 250 feet following estim treatment without symtoms or antalgia     Lumbar Exercises: Stretches   Piriformis Stretch 2 reps;30 seconds   Piriformis Stretch Limitations seated     Modalities   Modalities Cryotherapy;Electrical Stimulation     Cryotherapy   Number Minutes Cryotherapy 15 Minutes   Cryotherapy Location Hip   Type of Cryotherapy Ice pack     Electrical Stimulation   Electrical Stimulation  Location Lt hip in Rt sidelying   Electrical Stimulation Action to decrease pain   Electrical Stimulation Parameters hi lo sweep, interferential, 10 CV volts   Electrical Stimulation Goals Pain                  PT Short Term Goals - 11/18/16 1047      PT SHORT TERM GOAL #1   Title Patient to experience pain no more than 2/10 during all functional tasks and activities in order to improve QOL and functional task performance    Baseline 4/10- pain can still get to 5-6/10   Time 3   Period Weeks   Status On-going     PT SHORT TERM GOAL #2   Title Patient to be able to complete at least 60% of walking program recommended by MD without pain exacerbation in order to assist in adhering to post-op recommendations and to improve  recovery    Baseline 4/10- has improved greatly    Time 3   Period Weeks   Status Achieved     PT SHORT TERM GOAL #3   Title Patient to be independent in correctly and consistently performing targeted HEP, to be updated as appropriate    Baseline 4/10- compliant 2x/day    Time 3   Period Weeks   Status Achieved           PT Long Term Goals - 11/18/16 1048      PT LONG TERM GOAL #1   Title Patient to demonstrate functional strength 5/5 in all tested muscle groups in order to assist in reducing pain and facilitating return to PLOF    Baseline 4/10- much improved    Time 6   Period Weeks   Status Partially Met     PT LONG TERM GOAL #2   Title Patient to be able to complete 100% of MD recommended walking program without pain exacerbation in order to allow her to follow post-op recommendations and to aide recovery    Baseline 4/10- generally back on track    Time 6   Period Weeks   Status Achieved     PT LONG TERM GOAL #3   Title Patient to report she has been able to sleep through the night without waking due to pain in order to improve overall QOL    Baseline 4/10- still waking up due to pain especially if she rolls over onto the hip, also  has had some muscle spams in back    Time 6   Period Weeks   Status On-going     PT LONG TERM GOAL #4   Title Patient to demonstrate improved gait mechanics including elimination of antalgic pattern and L Hip hike in order to reduce pain and improve efficiency of overall mobility  Time 6   Period Weeks   Status Achieved               Plan - 12/02/16 1132    Clinical Impression Statement Pt returned to MD who prescribed her steroids and made her an appointment with hip specialist next Tuesday.  CT scan with basic arthritis and tiny cyst present so unsure of pain causitive factors other than these.  Per pateint, MD would like therapy to try gentle stretches and modalities at this point.  Spoke to evaluating thearpist with trial of IFES with icepack to Lt hip this session.  Extreme tightness noted when completing piriformis stretch, so encouraged patient to continue these.  Pt to bring in her written order from MD next session.    Rehab Potential Fair   Clinical Impairments Affecting Rehab Potential (+) motivated to participate in skilled PT services, high PLOF; (-) extensive lumbar surgeries, circumstances leading to difficulty with pain control   PT Frequency 2x / week   PT Duration 3 weeks   PT Treatment/Interventions ADLs/Self Care Home Management;Biofeedback;Cryotherapy;Moist Heat;Gait training;Stair training;Functional mobility training;Therapeutic activities;Therapeutic exercise;Balance training;Neuromuscular re-education;Patient/family education;Manual techniques;Passive range of motion;Energy conservation;Taping   PT Next Visit Plan PER MD- NO BENDING, LIFTING, TWISTING, SQUATTING, STRAINING, ETC Continue X 1 time week for next couple week unless trial of ionto and then would have to increase frequency (3X week for 2 week).  Assess effectiveness of IFES/ice and issue TENS for home use if applicable.  Await further MD orders and hip specialist consult.    PT Home Exercise Plan  3/21- seated marches with neutral spine, seated clams, sit to stand with no UEs (low reps); 4/10: hip ADD stretch, total gym, Nustep    Consulted and Agree with Plan of Care Patient      Patient will benefit from skilled therapeutic intervention in order to improve the following deficits and impairments:  Abnormal gait, Improper body mechanics, Pain, Decreased coordination, Decreased mobility, Decreased activity tolerance, Decreased strength, Difficulty walking  Visit Diagnosis: Abnormal posture  Muscle weakness (generalized)  Difficulty in walking, not elsewhere classified  Other symptoms and signs involving the musculoskeletal system     Problem List Patient Active Problem List   Diagnosis Date Noted  . Radiculopathy 09/11/2016  . Weakness generalized 11/15/2012  . Paresthesias 11/15/2012  . Pain in limb 11/15/2012  . Numbness and tingling 11/15/2012    Teena Irani, PTA/CLT [phone]  12/02/2016, 11:45 AM  [place], [place], [address] Phone: [phone]   Fax:  [phone]  Name: Melissa Blackburn MRN: [id number] Date of Birth: May 23, 1956

## 2016-12-04 ENCOUNTER — Ambulatory Visit (HOSPITAL_COMMUNITY): Payer: PRIVATE HEALTH INSURANCE

## 2016-12-08 ENCOUNTER — Telehealth (HOSPITAL_COMMUNITY): Payer: Self-pay | Admitting: Physical Therapy

## 2016-12-08 DIAGNOSIS — G4733 Obstructive sleep apnea (adult) (pediatric): Secondary | ICD-10-CM | POA: Diagnosis not present

## 2016-12-08 NOTE — Telephone Encounter (Signed)
Patty casemanager with worker's comp states that patient saw Dr. Lynann Bologna and he wants patient to be seen for lumbar spine and hip. Chong Sicilian will fax referral tomorrow. NF 12/08/2016

## 2016-12-09 ENCOUNTER — Encounter (HOSPITAL_COMMUNITY): Payer: Self-pay | Admitting: Physical Therapy

## 2016-12-10 ENCOUNTER — Ambulatory Visit (HOSPITAL_COMMUNITY): Payer: PRIVATE HEALTH INSURANCE | Admitting: Physical Therapy

## 2016-12-10 MED FILL — ATENOLOL 25 MG TABLET: 25 | 30 days supply | Qty: 30 | Fill #5

## 2016-12-15 MED FILL — AMITRIPTYLINE HCL 50 MG TAB: 50 | 30 days supply | Qty: 60 | Fill #1

## 2016-12-16 ENCOUNTER — Ambulatory Visit (HOSPITAL_COMMUNITY): Payer: PRIVATE HEALTH INSURANCE | Attending: Orthopedic Surgery | Admitting: Physical Therapy

## 2016-12-16 ENCOUNTER — Encounter (HOSPITAL_COMMUNITY): Payer: Self-pay

## 2016-12-16 DIAGNOSIS — R293 Abnormal posture: Secondary | ICD-10-CM | POA: Diagnosis not present

## 2016-12-16 DIAGNOSIS — R262 Difficulty in walking, not elsewhere classified: Secondary | ICD-10-CM | POA: Diagnosis not present

## 2016-12-16 DIAGNOSIS — M6281 Muscle weakness (generalized): Secondary | ICD-10-CM | POA: Insufficient documentation

## 2016-12-16 DIAGNOSIS — R29898 Other symptoms and signs involving the musculoskeletal system: Secondary | ICD-10-CM | POA: Diagnosis not present

## 2016-12-16 NOTE — Therapy (Signed)
[facility] [address] [place], [place], [address] Phone: [phone]   Fax:  [phone]  Physical Therapy Treatment  Patient Details  Name: Melissa Blackburn MRN: [id number] Date of Birth: [birth year] Referring Provider: Phylliss Bob  Encounter Date: 12/16/2016      PT End of Session - 12/16/16 1204    Visit Number 7   Number of Visits 12   Date for PT Re-Evaluation 12/16/16   Authorization Type Worker's Comp (12 sessions approved as of 10/29/16)   Authorization Time Period 10/29/16 to 12/10/16   Authorization - Visit Number 7   Authorization - Number of Visits 12   Activity Tolerance Patient tolerated treatment well   Behavior During Therapy [facility] for tasks assessed/performed      Past Medical History:  Diagnosis Date  . Anemia    hx of  . Asthma    "adult onset" (09/11/2016)  . DDD (degenerative disc disease)    "hands, knees" (09/11/2016)  . Hypertension   . Migraine    "none since stimulator placed 07/2016" (09/11/2016)  . Needle stick injury with contaminated needle    patient undergoing testing  . Numbness and tingling 11/15/2012  . OSA on CPAP    cpap 7 (09/11/2016)  . Pain in limb 11/15/2012  . Paresthesias 11/15/2012  . Pneumonia 1980s X 1  . PONV (postoperative nausea and vomiting)    "severe nausea and vomiting"  . Weakness generalized 11/15/2012    Past Surgical History:  Procedure Laterality Date  . BACK SURGERY    . ESOPHAGOGASTRODUODENOSCOPY (EGD) WITH PROPOFOL N/A 01/10/2016   Procedure: ESOPHAGOGASTRODUODENOSCOPY (EGD) WITH PROPOFOL;  Surgeon: Milus Banister, MD;  Location: [facility];  Service: Endoscopy;  Laterality: N/A;  . KNEE ARTHROSCOPY  04/23/2012   Procedure: ARTHROSCOPY KNEE;  Surgeon: Augustin Schooling, MD;  Location: [facility];  Service: Orthopedics;  Laterality: Left;  lateral meniscectomy  . KNEE ARTHROSCOPY Bilateral    "bone fragments"  . KNEE ARTHROSCOPY W/ ACL RECONSTRUCTION Left 2009  . KNEE ARTHROSCOPY  W/ MENISCAL REPAIR Left 1981  . KNEE ARTHROSCOPY W/ MENISCAL REPAIR Left 2012  . LUMBAR FUSION Left 09/11/2016   LUMBAR 4-5 TRANSFORAMINAL LUMBAR INTERBODY FUSION WITH INSTRUMENTATION AND Gertha Calkin Archie Endo 09/11/2016  . PERIPHERAL NERVE STIMULATOR Right 07/2016   "in my chest; to treat migraine headaches"  . RECTOCELE REPAIR    . SHOULDER ARTHROSCOPY W/ SUPERIOR LABRAL ANTERIOR POSTERIOR REPAIR Bilateral    "I have rivets in both shoulders to hold them together" (09/11/2016)  . SHOULDER ARTHROSCOPY WITH SUBACROMIAL DECOMPRESSION Right 1994 X 2; 1995; ~ 2010  . UPPER ESOPHAGEAL ENDOSCOPIC ULTRASOUND (EUS)  01/10/2016   Procedure: UPPER ESOPHAGEAL ENDOSCOPIC ULTRASOUND (EUS);  Surgeon: Milus Banister, MD;  Location: [facility];  Service: Endoscopy;;  . VAGINAL HYSTERECTOMY  1986   "partial"    There were no vitals filed for this visit.      Subjective Assessment - 12/16/16 1126    Subjective Pt comes today with order for hip therapy in addition to lumbar.  HIp is already being addressed and has been tested with original lumbar evaluation.  States she went to hip specialist last week and performed a US guided injection into Lt hip but has not changed symtoms.  Pt reports pain 4/10 in lt hip and no pain in lumbar area.  States returns to specialist the 25th and may complete CT scan if no changes.    Currently in Pain? Yes   Pain  Score 4    Pain Location Hip   Pain Orientation Left   Pain Descriptors / Indicators Sore;Aching                         [facility] Adult PT Treatment/Exercise - 12/16/16 0001      Lumbar Exercises: Stretches   Piriformis Stretch 2 reps;30 seconds   Piriformis Stretch Limitations seated     Lumbar Exercises: Supine   Bridge 10 reps   Bridge Limitations 2 sets with glute squeeze   Other Supine Lumbar Exercises sidelying hip abduction 2 sets 10 reps     Lumbar Exercises: Sidelying   Clam 10 reps   Clam Limitations 2 sets with 5" holds      Modalities   Modalities Iontophoresis     Iontophoresis   Type of Iontophoresis Dexamethasone   Location Lt hip   Dose 60   Time 15 minutes     Manual Therapy   Manual Therapy Soft tissue mobilization   Manual therapy comments separate rest of session   Soft tissue mobilization Lt adductors along proximal/mid/distal portions                 PT Education - 12/16/16 1205    Education provided Yes   Education Details educated on iontophoresis treatment with dexamethasone.   Person(s) Educated Patient   Methods Explanation   Comprehension Verbalized understanding          PT Short Term Goals - 11/18/16 1047      PT SHORT TERM GOAL #1   Title Patient to experience pain no more than 2/10 during all functional tasks and activities in order to improve QOL and functional task performance    Baseline 4/10- pain can still get to 5-6/10   Time 3   Period Weeks   Status On-going     PT SHORT TERM GOAL #2   Title Patient to be able to complete at least 60% of walking program recommended by MD without pain exacerbation in order to assist in adhering to post-op recommendations and to improve  recovery    Baseline 4/10- has improved greatly    Time 3   Period Weeks   Status Achieved     PT SHORT TERM GOAL #3   Title Patient to be independent in correctly and consistently performing targeted HEP, to be updated as appropriate    Baseline 4/10- compliant 2x/day    Time 3   Period Weeks   Status Achieved           PT Long Term Goals - 11/18/16 1048      PT LONG TERM GOAL #1   Title Patient to demonstrate functional strength 5/5 in all tested muscle groups in order to assist in reducing pain and facilitating return to PLOF    Baseline 4/10- much improved    Time 6   Period Weeks   Status Partially Met     PT LONG TERM GOAL #2   Title Patient to be able to complete 100% of MD recommended walking program without pain exacerbation in order to allow her to follow  post-op recommendations and to aide recovery    Baseline 4/10- generally back on track    Time 6   Period Weeks   Status Achieved     PT LONG TERM GOAL #3   Title Patient to report she has been able to sleep through the night without waking due to pain in  order to improve overall QOL    Baseline 4/10- still waking up due to pain especially if she rolls over onto the hip, also has had some muscle spams in back    Time 6   Period Weeks   Status On-going     PT LONG TERM GOAL #4   Title Patient to demonstrate improved gait mechanics including elimination of antalgic pattern and L Hip hike in order to reduce pain and improve efficiency of overall mobility    Time 6   Period Weeks   Status Achieved               Plan - 12/16/16 1200    Clinical Impression Statement Continued with focus on Lt hip and lumbar symtoms.  Pt without relief from recent trial of IFES with ice.   Trial of ionto with dexamethasone this session, ok per therapist.  Explained the mechanism and possible skin reactions to patient with veralized understanding.  Cotninued with LT hip strengthening exercises and adductor stretch/manual.  Pt without change in symptoms immediately following session    Rehab Potential Fair   Clinical Impairments Affecting Rehab Potential (+) motivated to participate in skilled PT services, high PLOF; (-) extensive lumbar surgeries, circumstances leading to difficulty with pain control   PT Frequency 2x / week   PT Duration 3 weeks   PT Treatment/Interventions ADLs/Self Care Home Management;Biofeedback;Cryotherapy;Moist Heat;Gait training;Stair training;Functional mobility training;Therapeutic activities;Therapeutic exercise;Balance training;Neuromuscular re-education;Patient/family education;Manual techniques;Passive range of motion;Energy conservation;Taping   PT Next Visit Plan PER MD- NO BENDING, LIFTING, TWISTING, SQUATTING, STRAINING, ETC.  MD extened PT visits wtih trial of ionto.   Continue X remaining sessions with ionto treatment.    PT Home Exercise Plan 3/21- seated marches with neutral spine, seated clams, sit to stand with no UEs (low reps); 4/10: hip ADD stretch, total gym, Nustep    Consulted and Agree with Plan of Care Patient      Patient will benefit from skilled therapeutic intervention in order to improve the following deficits and impairments:  Abnormal gait, Improper body mechanics, Pain, Decreased coordination, Decreased mobility, Decreased activity tolerance, Decreased strength, Difficulty walking  Visit Diagnosis: Abnormal posture  Muscle weakness (generalized)  Difficulty in walking, not elsewhere classified  Other symptoms and signs involving the musculoskeletal system     Problem List Patient Active Problem List   Diagnosis Date Noted  . Radiculopathy 09/11/2016  . Weakness generalized 11/15/2012  . Paresthesias 11/15/2012  . Pain in limb 11/15/2012  . Numbness and tingling 11/15/2012    Teena Irani, PTA/CLT [phone]  12/16/2016, 12:08 PM  [facility] [address] [place], [place], [address] Phone: [phone]   Fax:  [phone]  Name: YANIYAH KOORS MRN: [id number] Date of Birth: [birth year]

## 2016-12-23 ENCOUNTER — Encounter (HOSPITAL_COMMUNITY): Payer: Self-pay | Admitting: Physical Therapy

## 2016-12-30 ENCOUNTER — Ambulatory Visit (HOSPITAL_COMMUNITY): Payer: PRIVATE HEALTH INSURANCE | Admitting: Physical Therapy

## 2016-12-30 DIAGNOSIS — R262 Difficulty in walking, not elsewhere classified: Secondary | ICD-10-CM

## 2016-12-30 DIAGNOSIS — R29898 Other symptoms and signs involving the musculoskeletal system: Secondary | ICD-10-CM | POA: Diagnosis not present

## 2016-12-30 DIAGNOSIS — R293 Abnormal posture: Secondary | ICD-10-CM | POA: Diagnosis not present

## 2016-12-30 DIAGNOSIS — M6281 Muscle weakness (generalized): Secondary | ICD-10-CM | POA: Diagnosis not present

## 2016-12-30 NOTE — Therapy (Signed)
[facility] [place], [place], [address] Phone: [phone]   Fax:  [phone]  Physical Therapy Treatment (Re-Assessment)  Patient Details  Name: Melissa Blackburn MRN: [id number] Date of Birth: [birth year] Referring Provider: Elta Guadeloupe Dumonski/Eric Hardin Negus   Encounter Date: 12/30/2016      PT End of Session - 12/30/16 1202    Visit Number 8   Number of Visits 20   Date for PT Re-Evaluation 01/22/17   Authorization Type Worker's Comp (12 more sessions approved as of 5/8)   Authorization Time Period 11/18/71 to 12/12/27; recert done 9/24    Authorization - Visit Number 1   Authorization - Number of Visits 12   PT Start Time 1120   PT Stop Time 1155   PT Time Calculation (min) 35 min   Activity Tolerance Patient tolerated treatment well   Behavior During Therapy [facility] for tasks assessed/performed      Past Medical History:  Diagnosis Date  . Anemia    hx of  . Asthma    "adult onset" (09/11/2016)  . DDD (degenerative disc disease)    "hands, knees" (09/11/2016)  . Hypertension   . Migraine    "none since stimulator placed 07/2016" (09/11/2016)  . Needle stick injury with contaminated needle    patient undergoing testing  . Numbness and tingling 11/15/2012  . OSA on CPAP    cpap 7 (09/11/2016)  . Pain in limb 11/15/2012  . Paresthesias 11/15/2012  . Pneumonia 1980s X 1  . PONV (postoperative nausea and vomiting)    "severe nausea and vomiting"  . Weakness generalized 11/15/2012    Past Surgical History:  Procedure Laterality Date  . BACK SURGERY    . ESOPHAGOGASTRODUODENOSCOPY (EGD) WITH PROPOFOL N/A 01/10/2016   Procedure: ESOPHAGOGASTRODUODENOSCOPY (EGD) WITH PROPOFOL;  Surgeon: Milus Banister, MD;  Location: [facility];  Service: Endoscopy;  Laterality: N/A;  . KNEE ARTHROSCOPY  04/23/2012   Procedure: ARTHROSCOPY KNEE;  Surgeon: Augustin Schooling, MD;  Location: [facility];  Service: Orthopedics;  Laterality: Left;  lateral  meniscectomy  . KNEE ARTHROSCOPY Bilateral    "bone fragments"  . KNEE ARTHROSCOPY W/ ACL RECONSTRUCTION Left 2009  . KNEE ARTHROSCOPY W/ MENISCAL REPAIR Left 1981  . KNEE ARTHROSCOPY W/ MENISCAL REPAIR Left 2012  . LUMBAR FUSION Left 09/11/2016   LUMBAR 4-5 TRANSFORAMINAL LUMBAR INTERBODY FUSION WITH INSTRUMENTATION AND Gertha Calkin Archie Endo 09/11/2016  . PERIPHERAL NERVE STIMULATOR Right 07/2016   "in my chest; to treat migraine headaches"  . RECTOCELE REPAIR    . SHOULDER ARTHROSCOPY W/ SUPERIOR LABRAL ANTERIOR POSTERIOR REPAIR Bilateral    "I have rivets in both shoulders to hold them together" (09/11/2016)  . SHOULDER ARTHROSCOPY WITH SUBACROMIAL DECOMPRESSION Right 1994 X 2; 1995; ~ 2010  . UPPER ESOPHAGEAL ENDOSCOPIC ULTRASOUND (EUS)  01/10/2016   Procedure: UPPER ESOPHAGEAL ENDOSCOPIC ULTRASOUND (EUS);  Surgeon: Milus Banister, MD;  Location: [facility];  Service: Endoscopy;;  . VAGINAL HYSTERECTOMY  1986   "partial"    There were no vitals filed for this visit.      Subjective Assessment - 12/30/16 1121    Subjective Patient arrives today after being out for multiple weeks; she states that her hip doctor thinks her pain is from thie back and the back doctor thinks that her pain is from the hip. She states there is no major plan moving forward, she goes to see Dr. Lynann Bologna Friday. She states that her main precuation right now is just  lifting no more than 10#, she is otherwise clear to move her back more. She states that the dexamethasone was initially uncomfortable but it felt a little better afterwards.    Pertinent History DDD, permanent nerve stimular implant for migranes, multiple knee surgeries, lumbar fusion 09/11/16   How long can you sit comfortably? 5/22- 30 minutes   How long can you stand comfortably? 5/22- 30 minutes    How long can you walk comfortably? 5/22- 1-2 miles    Patient Stated Goals get rid of pain, get back to outdoor activities like hunting, get back to PLOF     Currently in Pain? Yes   Pain Score 3             [facility] PT Assessment - 12/30/16 0001      Assessment   Medical Diagnosis lumbar fusion/hip pain    Referring Provider Elta Guadeloupe Dumonski/Eric Phillips    Onset Date/Surgical Date 09/11/16   Next MD Visit Dr. Lynann Bologna Friday      Precautions   Precaution Comments no lifting over 10#      Balance Screen   Has the patient fallen in the past 6 months No   Has the patient had a decrease in activity level because of a fear of falling?  No   Is the patient reluctant to leave their home because of a fear of falling?  No     Prior Function   Level of Independence Independent   Vocation Workers comp;Other (comment)     Observation/Other Assessments   Observations scour negative B, FABER negative B, FADIR negative B, SLR negative B      AROM   Lumbar Flexion mild limiation; RFIS mild increase in nerve pain L hip    Lumbar Extension mild limitation; REIS no change, just stiff    Lumbar - Right Side Bend fingertips just about knee    Lumbar - Left Side Bend fingertips just above knee      Strength   Right Hip Flexion 5/5   Right Hip Extension 4/5   Right Hip ABduction 5/5   Left Hip Flexion 4+/5   Left Hip Extension 3+/5   Left Hip ABduction 4-/5   Right Knee Flexion 5/5   Right Knee Extension 4+/5   Left Knee Flexion 5/5   Left Knee Extension 4+/5   Right Ankle Dorsiflexion 5/5   Left Ankle Dorsiflexion 5/5     Flexibility   Hamstrings min limitation L    Piriformis min limitation L      Palpation   Palpation comment point tenderness to palpation noted L lateral hip/TFL area      6 minute walk test results    Aerobic Endurance Distance Walked 904   Endurance additional comments 3MWT                              PT Education - 12/30/16 1201    Education provided Yes   Education Details DPT examination reveals likley hip problem rather than back issue; since she is able to now do more (some  precautions have been lifted) we will give PT antoher try; HEP revision    Person(s) Educated Patient   Methods Explanation;Demonstration;Handout   Comprehension Verbalized understanding;Returned demonstration;Need further instruction          PT Short Term Goals - 11/18/16 1047      PT SHORT TERM GOAL #1   Title Patient to experience pain  no more than 2/10 during all functional tasks and activities in order to improve QOL and functional task performance    Baseline 4/10- pain can still get to 5-6/10   Time 3   Period Weeks   Status On-going     PT SHORT TERM GOAL #2   Title Patient to be able to complete at least 60% of walking program recommended by MD without pain exacerbation in order to assist in adhering to post-op recommendations and to improve  recovery    Baseline 4/10- has improved greatly    Time 3   Period Weeks   Status Achieved     PT SHORT TERM GOAL #3   Title Patient to be independent in correctly and consistently performing targeted HEP, to be updated as appropriate    Baseline 4/10- compliant 2x/day    Time 3   Period Weeks   Status Achieved           PT Long Term Goals - 11/18/16 1048      PT LONG TERM GOAL #1   Title Patient to demonstrate functional strength 5/5 in all tested muscle groups in order to assist in reducing pain and facilitating return to PLOF    Baseline 4/10- much improved    Time 6   Period Weeks   Status Partially Met     PT LONG TERM GOAL #2   Title Patient to be able to complete 100% of MD recommended walking program without pain exacerbation in order to allow her to follow post-op recommendations and to aide recovery    Baseline 4/10- generally back on track    Time 6   Period Weeks   Status Achieved     PT LONG TERM GOAL #3   Title Patient to report she has been able to sleep through the night without waking due to pain in order to improve overall QOL    Baseline 4/10- still waking up due to pain especially if she rolls  over onto the hip, also has had some muscle spams in back    Time 6   Period Weeks   Status On-going     PT LONG TERM GOAL #4   Title Patient to demonstrate improved gait mechanics including elimination of antalgic pattern and L Hip hike in order to reduce pain and improve efficiency of overall mobility    Time 6   Period Weeks   Status Achieved               Plan - 12/30/16 1206    Clinical Impression Statement Re-assessment performed today; patient reports that some of her precautions have been lifted and she is now able to do everything except she cannot lift anymore than 10#. Examination shows ongoing difficulty with L hip point tenderness and muscle weakness, with PT able to identify specific targeted area tender to palpation and painful with active full range hip abduction/piriformis stretching. At this point suspect possible tendinopathy of hip abductors versus ongoing bursitis and revisited/modified HEP with exercises to address these muscles in a painfree manner. Due to patient now being able to participate in more interventions, recommend ongoing trial of skilled PT services to attempt to continue to reduce pain and improve function moving forward.    Rehab Potential Fair   Clinical Impairments Affecting Rehab Potential (+) motivated to participate in skilled PT services, high PLOF; (-) extensive lumbar surgeries, circumstances leading to difficulty with pain control, now chronicity of pain    PT Frequency  2x / week   PT Duration 3 weeks   PT Treatment/Interventions ADLs/Self Care Home Management;Biofeedback;Cryotherapy;Moist Heat;Gait training;Stair training;Functional mobility training;Therapeutic activities;Therapeutic exercise;Balance training;Neuromuscular re-education;Patient/family education;Manual techniques;Passive range of motion;Energy conservation;Taping;Electrical Stimulation;Iontophoresis '4mg'$ /ml Dexamethasone;Ultrasound;DME Instruction;Dry needling   PT Next Visit  Plan CANNOT LIFT MORE THAN 10#. Work on cautious hip extensors and abductors (carefully graded progression with pain free activities). Manual PRN. Trial cautious TFL stretch (Ober aggravates so try less aggressive version) Continue trial of ionto, may consider TENS if ionto does not help pain    PT Home Exercise Plan 3/21- seated marches with neutral spine, seated clams, sit to stand with no UEs (low reps); 4/10: hip ADD stretch, total gym, Nustep; 5/22: revision of HEP- walking, hip hikes, isometric hip ABD    Consulted and Agree with Plan of Care Patient      Patient will benefit from skilled therapeutic intervention in order to improve the following deficits and impairments:  Abnormal gait, Improper body mechanics, Pain, Decreased coordination, Decreased mobility, Decreased activity tolerance, Decreased strength, Difficulty walking  Visit Diagnosis: Abnormal posture - Plan: PT plan of care cert/re-cert  Muscle weakness (generalized) - Plan: PT plan of care cert/re-cert  Difficulty in walking, not elsewhere classified - Plan: PT plan of care cert/re-cert  Other symptoms and signs involving the musculoskeletal system - Plan: PT plan of care cert/re-cert     Problem List Patient Active Problem List   Diagnosis Date Noted  . Radiculopathy 09/11/2016  . Weakness generalized 11/15/2012  . Paresthesias 11/15/2012  . Pain in limb 11/15/2012  . Numbness and tingling 11/15/2012    Deniece Ree PT, DPT [facility] [address] [place], [place], [address] Phone: [phone]   Fax:  [phone]  Name: Melissa Blackburn MRN: [id number] Date of Birth: 1955-11-09

## 2016-12-30 NOTE — Patient Instructions (Signed)
   HIP HIKES  While standing up on a step, lower one leg downward towards the floor by tilting your pelvis to the side.   Then return the pelvis/leg back to a leveled position.  Repeat 10-15 times each side, twice a day.    Isometric hip abduction   Stand with both feet directly under hips next to wall. You can bring your arm in front of you so it does not get in the way.  Next, bend the knee closest to the wall and push into the wall as if you were trying to push your body away, but without moving.  Hold until fatigue.   Repeat 10 times each side, twice a day.

## 2017-01-01 ENCOUNTER — Ambulatory Visit (HOSPITAL_COMMUNITY): Payer: PRIVATE HEALTH INSURANCE

## 2017-01-01 ENCOUNTER — Encounter (HOSPITAL_COMMUNITY): Payer: Self-pay

## 2017-01-01 DIAGNOSIS — M6281 Muscle weakness (generalized): Secondary | ICD-10-CM | POA: Diagnosis not present

## 2017-01-01 DIAGNOSIS — R29898 Other symptoms and signs involving the musculoskeletal system: Secondary | ICD-10-CM | POA: Diagnosis not present

## 2017-01-01 DIAGNOSIS — R262 Difficulty in walking, not elsewhere classified: Secondary | ICD-10-CM | POA: Diagnosis not present

## 2017-01-01 DIAGNOSIS — R293 Abnormal posture: Secondary | ICD-10-CM

## 2017-01-01 NOTE — Therapy (Signed)
[place], [place], [address] Phone: [phone]   Fax:  [phone]  Physical Therapy Treatment  Patient Details  Name: Melissa Blackburn MRN: [id number] Date of Birth: 1955-11-30 Referring Provider: Phylliss Bob  Encounter Date: 01/01/2017      PT End of Session - 01/01/17 1122    Visit Number 9   Number of Visits 12   Date for PT Re-Evaluation 01/22/17   Authorization Type Worker's Comp (12 more sessions approved as of 5/8)   Authorization Time Period 9/35/70 to 08/17/77; recert done 3/90    PT Start Time 1119   PT Stop Time 1208   PT Time Calculation (min) 49 min   Activity Tolerance Patient tolerated treatment well  Increased lateral hip pain from 3/10 to 4/10   Behavior During Therapy [facility] for tasks assessed/performed      Past Medical History:  Diagnosis Date  . Anemia    hx of  . Asthma    "adult onset" (09/11/2016)  . DDD (degenerative disc disease)    "hands, knees" (09/11/2016)  . Hypertension   . Migraine    "none since stimulator placed 07/2016" (09/11/2016)  . Needle stick injury with contaminated needle    patient undergoing testing  . Numbness and tingling 11/15/2012  . OSA on CPAP    cpap 7 (09/11/2016)  . Pain in limb 11/15/2012  . Paresthesias 11/15/2012  . Pneumonia 1980s X 1  . PONV (postoperative nausea and vomiting)    "severe nausea and vomiting"  . Weakness generalized 11/15/2012    Past Surgical History:  Procedure Laterality Date  . BACK SURGERY    . ESOPHAGOGASTRODUODENOSCOPY (EGD) WITH PROPOFOL N/A 01/10/2016   Procedure: ESOPHAGOGASTRODUODENOSCOPY (EGD) WITH PROPOFOL;  Surgeon: Milus Banister, MD;  Location: [facility];  Service: Endoscopy;  Laterality: N/A;  . KNEE ARTHROSCOPY  04/23/2012   Procedure: ARTHROSCOPY KNEE;  Surgeon: Augustin Schooling, MD;  Location: [facility];  Service: Orthopedics;  Laterality: Left;  lateral meniscectomy  . KNEE ARTHROSCOPY Bilateral    "bone fragments"   . KNEE ARTHROSCOPY W/ ACL RECONSTRUCTION Left 2009  . KNEE ARTHROSCOPY W/ MENISCAL REPAIR Left 1981  . KNEE ARTHROSCOPY W/ MENISCAL REPAIR Left 2012  . LUMBAR FUSION Left 09/11/2016   LUMBAR 4-5 TRANSFORAMINAL LUMBAR INTERBODY FUSION WITH INSTRUMENTATION AND Gertha Calkin Archie Endo 09/11/2016  . PERIPHERAL NERVE STIMULATOR Right 07/2016   "in my chest; to treat migraine headaches"  . RECTOCELE REPAIR    . SHOULDER ARTHROSCOPY W/ SUPERIOR LABRAL ANTERIOR POSTERIOR REPAIR Bilateral    "I have rivets in both shoulders to hold them together" (09/11/2016)  . SHOULDER ARTHROSCOPY WITH SUBACROMIAL DECOMPRESSION Right 1994 X 2; 1995; ~ 2010  . UPPER ESOPHAGEAL ENDOSCOPIC ULTRASOUND (EUS)  01/10/2016   Procedure: UPPER ESOPHAGEAL ENDOSCOPIC ULTRASOUND (EUS);  Surgeon: Milus Banister, MD;  Location: [facility];  Service: Endoscopy;;  . VAGINAL HYSTERECTOMY  1986   "partial"    There were no vitals filed for this visit.      Subjective Assessment - 01/01/17 1116    Subjective Pt stated she was sore upon arrival.  Back is sore at times, no real pain or c/o.  Continues to have achey pain Lt hip, pain scale 3/10.  Reports relief for a couple days following dexamethasone following initial discomfot   Patient Stated Goals get rid of pain, get back to outdoor activities like hunting, get back to PLOF    Currently in Pain? Yes  Pain Score 3    Pain Location Hip   Pain Orientation Left;Lateral   Pain Descriptors / Indicators Aching   Pain Type Chronic pain   Pain Onset More than a month ago   Pain Frequency Constant   Aggravating Factors  being on her feet, laying on Lt side   Pain Relieving Factors ice   Effect of Pain on Daily Activities severe impact            [facility] PT Assessment - 01/01/17 0001      Assessment   Medical Diagnosis lumbar fusion/hip pain    Referring Provider Phylliss Bob   Onset Date/Surgical Date 09/11/16   Next MD Visit Dr. Lynann Bologna 01/02/17                      [facility] PT Treatment/Exercise - 01/01/17 0001      Exercises   Exercises Lumbar     Lumbar Exercises: Stretches   Hip Flexor Stretch 2 reps;30 seconds   Hip Flexor Stretch Limitations standing on 2nd step hip flexor stretch     Lumbar Exercises: Standing   Other Standing Lumbar Exercises hip hike static 2x10; 2in step hip hike with swing   Other Standing Lumbar Exercises isometric abduction 2x 10reps     Modalities   Modalities Iontophoresis     Iontophoresis   Type of Iontophoresis Dexamethasone   Location Lt hip   Dose 40 mA   Time 10     Manual Therapy   Manual Therapy Soft tissue mobilization   Manual therapy comments separate rest of session   Soft tissue mobilization Lt adductors along proximal/mid/distal portions                   PT Short Term Goals - 11/18/16 1047      PT SHORT TERM GOAL #1   Title Patient to experience pain no more than 2/10 during all functional tasks and activities in order to improve QOL and functional task performance    Baseline 4/10- pain can still get to 5-6/10   Time 3   Period Weeks   Status On-going     PT SHORT TERM GOAL #2   Title Patient to be able to complete at least 60% of walking program recommended by MD without pain exacerbation in order to assist in adhering to post-op recommendations and to improve  recovery    Baseline 4/10- has improved greatly    Time 3   Period Weeks   Status Achieved     PT SHORT TERM GOAL #3   Title Patient to be independent in correctly and consistently performing targeted HEP, to be updated as appropriate    Baseline 4/10- compliant 2x/day    Time 3   Period Weeks   Status Achieved           PT Long Term Goals - 11/18/16 1048      PT LONG TERM GOAL #1   Title Patient to demonstrate functional strength 5/5 in all tested muscle groups in order to assist in reducing pain and facilitating return to PLOF    Baseline 4/10- much improved    Time 6   Period Weeks   Status  Partially Met     PT LONG TERM GOAL #2   Title Patient to be able to complete 100% of MD recommended walking program without pain exacerbation in order to allow her to follow post-op recommendations and to aide recovery  Baseline 4/10- generally back on track    Time 6   Period Weeks   Status Achieved     PT LONG TERM GOAL #3   Title Patient to report she has been able to sleep through the night without waking due to pain in order to improve overall QOL    Baseline 4/10- still waking up due to pain especially if she rolls over onto the hip, also has had some muscle spams in back    Time 6   Period Weeks   Status On-going     PT LONG TERM GOAL #4   Title Patient to demonstrate improved gait mechanics including elimination of antalgic pattern and L Hip hike in order to reduce pain and improve efficiency of overall mobility    Time 6   Period Weeks   Status Achieved               Plan - 01/01/17 1159    Clinical Impression Statement Session focus on improving hip stability/strengthening and technqiues to address pain control.  Added isometric and proximal hip strengthening therex with abilty to demonstrate appropriate form/technique without difficulty.  Added stretches to address hip tightness and manual technqiues to reduce spasms proximal adductors.  EOS with ionto dexamethasone for pain control, reviewed proper wear time and encouraged to examine skin after removal for irritation.     Rehab Potential Fair   Clinical Impairments Affecting Rehab Potential (+) motivated to participate in skilled PT services, high PLOF; (-) extensive lumbar surgeries, circumstances leading to difficulty with pain control, now chronicity of pain    PT Frequency 2x / week   PT Duration 3 weeks   PT Treatment/Interventions ADLs/Self Care Home Management;Biofeedback;Cryotherapy;Moist Heat;Gait training;Stair training;Functional mobility training;Therapeutic activities;Therapeutic exercise;Balance  training;Neuromuscular re-education;Patient/family education;Manual techniques;Passive range of motion;Energy conservation;Taping;Electrical Stimulation;Iontophoresis '4mg'$ /ml Dexamethasone;Ultrasound;DME Instruction;Dry needling   PT Next Visit Plan CANNOT LIFT MORE THAN 10#. Work on cautious hip extensors and abductors (carefully graded progression with pain free activities). Manual PRN. Trial cautious TFL stretch (Ober aggravates so try less aggressive version).  Assess pain relief following 2nd session of ionto, if positive continue for 4 more sessions  May consider TENS if ionto does not help pain.     PT Home Exercise Plan 3/21- seated marches with neutral spine, seated clams, sit to stand with no UEs (low reps); 4/10: hip ADD stretch, total gym, Nustep; 5/22: revision of HEP- walking, hip hikes, isometric hip ABD       Patient will benefit from skilled therapeutic intervention in order to improve the following deficits and impairments:  Abnormal gait, Improper body mechanics, Pain, Decreased coordination, Decreased mobility, Decreased activity tolerance, Decreased strength, Difficulty walking  Visit Diagnosis: Abnormal posture  Muscle weakness (generalized)  Difficulty in walking, not elsewhere classified  Other symptoms and signs involving the musculoskeletal system     Problem List Patient Active Problem List   Diagnosis Date Noted  . Radiculopathy 09/11/2016  . Weakness generalized 11/15/2012  . Paresthesias 11/15/2012  . Pain in limb 11/15/2012  . Numbness and tingling 11/15/2012   Ihor Austin, LPTA; CBIS [phone]  Aldona Lento 01/01/2017, 12:40 PM  [facility] [address] [place], [place], [address] Phone: [phone]   Fax:  [phone]  Name: Melissa Blackburn MRN: [id number] Date of Birth: 1955-09-04

## 2017-01-06 ENCOUNTER — Ambulatory Visit (HOSPITAL_COMMUNITY): Payer: PRIVATE HEALTH INSURANCE | Admitting: Physical Therapy

## 2017-01-06 ENCOUNTER — Other Ambulatory Visit: Payer: Self-pay | Admitting: Orthopedic Surgery

## 2017-01-06 DIAGNOSIS — R262 Difficulty in walking, not elsewhere classified: Secondary | ICD-10-CM | POA: Diagnosis not present

## 2017-01-06 DIAGNOSIS — R29898 Other symptoms and signs involving the musculoskeletal system: Secondary | ICD-10-CM

## 2017-01-06 DIAGNOSIS — M6281 Muscle weakness (generalized): Secondary | ICD-10-CM

## 2017-01-06 DIAGNOSIS — M25552 Pain in left hip: Secondary | ICD-10-CM

## 2017-01-06 DIAGNOSIS — R293 Abnormal posture: Secondary | ICD-10-CM | POA: Diagnosis not present

## 2017-01-06 NOTE — Therapy (Signed)
[facility] [address] [place], [place], [address] Phone: [phone]   Fax:  [phone]  Physical Therapy Treatment  Patient Details  Name: Melissa Blackburn MRN: [id number] Date of Birth: 03-10-1956 Referring Provider: Estill Bamberg  Encounter Date: 01/06/2017      PT End of Session - 01/06/17 1207    Visit Number 10   Number of Visits 12   Date for PT Re-Evaluation 01/22/17   Authorization Type Worker's Comp (12 more sessions approved as of 5/8)   Authorization Time Period 10/29/16 to 12/10/16; recert done 5/22    Authorization - Visit Number 2   Authorization - Number of Visits 12   PT Start Time 1120   PT Stop Time 1200   PT Time Calculation (min) 40 min   Activity Tolerance Patient tolerated treatment well   Behavior During Therapy [facility] for tasks assessed/performed      Past Medical History:  Diagnosis Date  . Anemia    hx of  . Asthma    "adult onset" (09/11/2016)  . DDD (degenerative disc disease)    "hands, knees" (09/11/2016)  . Hypertension   . Migraine    "none since stimulator placed 07/2016" (09/11/2016)  . Needle stick injury with contaminated needle    patient undergoing testing  . Numbness and tingling 11/15/2012  . OSA on CPAP    cpap 7 (09/11/2016)  . Pain in limb 11/15/2012  . Paresthesias 11/15/2012  . Pneumonia 1980s X 1  . PONV (postoperative nausea and vomiting)    "severe nausea and vomiting"  . Weakness generalized 11/15/2012    Past Surgical History:  Procedure Laterality Date  . BACK SURGERY    . ESOPHAGOGASTRODUODENOSCOPY (EGD) WITH PROPOFOL N/A 01/10/2016   Procedure: ESOPHAGOGASTRODUODENOSCOPY (EGD) WITH PROPOFOL;  Surgeon: Rachael Fee, MD;  Location: [facility];  Service: Endoscopy;  Laterality: N/A;  . KNEE ARTHROSCOPY  04/23/2012   Procedure: ARTHROSCOPY KNEE;  Surgeon: Verlee Rossetti, MD;  Location: [facility];  Service: Orthopedics;  Laterality: Left;  lateral meniscectomy  . KNEE ARTHROSCOPY  Bilateral    "bone fragments"  . KNEE ARTHROSCOPY W/ ACL RECONSTRUCTION Left 2009  . KNEE ARTHROSCOPY W/ MENISCAL REPAIR Left 1981  . KNEE ARTHROSCOPY W/ MENISCAL REPAIR Left 2012  . LUMBAR FUSION Left 09/11/2016   LUMBAR 4-5 TRANSFORAMINAL LUMBAR INTERBODY FUSION WITH INSTRUMENTATION AND Jena Gauss Hattie Perch 09/11/2016  . PERIPHERAL NERVE STIMULATOR Right 07/2016   "in my chest; to treat migraine headaches"  . RECTOCELE REPAIR    . SHOULDER ARTHROSCOPY W/ SUPERIOR LABRAL ANTERIOR POSTERIOR REPAIR Bilateral    "I have rivets in both shoulders to hold them together" (09/11/2016)  . SHOULDER ARTHROSCOPY WITH SUBACROMIAL DECOMPRESSION Right 1994 X 2; 1995; ~ 2010  . UPPER ESOPHAGEAL ENDOSCOPIC ULTRASOUND (EUS)  01/10/2016   Procedure: UPPER ESOPHAGEAL ENDOSCOPIC ULTRASOUND (EUS);  Surgeon: Rachael Fee, MD;  Location: [facility];  Service: Endoscopy;;  . VAGINAL HYSTERECTOMY  1986   "partial"    There were no vitals filed for this visit.      Subjective Assessment - 01/06/17 1122    Subjective Patient states she is feeling sore, she had a very busy weekend as she was working. She is going to potentially get an SI injection soon, to be scheduled.    Pertinent History DDD, permanent nerve stimular implant for migranes, multiple knee surgeries, lumbar fusion 09/11/16   Patient Stated Goals get rid of pain, get back to outdoor activities like hunting,  get back to PLOF    Currently in Pain? Yes   Pain Score 4    Pain Location Hip   Pain Orientation Left                         [facility] Adult PT Treatment/Exercise - 01/06/17 0001      Lumbar Exercises: Stretches   Single Knee to Chest Stretch 5 reps;10 seconds   Lower Trunk Rotation 5 reps;10 seconds     Lumbar Exercises: Standing   Other Standing Lumbar Exercises hip abduction with hip hike 1/2 ROM 2x10   Other Standing Lumbar Exercises seated alternating opposite UE/LE 1x10      Lumbar Exercises: Seated   Hip Flexion on  Ball Limitations seated on dynadisc: crossmidline reaching for cone      Lumbar Exercises: Supine   Bridge 15 reps   Other Supine Lumbar Exercises pelvic clocks 12-6 and 3-9 x15    Other Supine Lumbar Exercises isometric hip abduction 1x10, 5 second holds      Manual Therapy   Manual Therapy Soft tissue mobilization   Manual therapy comments separate rest of session   Soft tissue mobilization ice massage L lateral hip, self-care provided throughout                 PT Education - 01/06/17 1207    Education provided Yes   Education Details possible causes/contributors to L hip pain during ice massage, we will check LLD/SI next time    Person(s) Educated Patient   Methods Explanation   Comprehension Verbalized understanding          PT Short Term Goals - 11/18/16 1047      PT SHORT TERM GOAL #1   Title Patient to experience pain no more than 2/10 during all functional tasks and activities in order to improve QOL and functional task performance    Baseline 4/10- pain can still get to 5-6/10   Time 3   Period Weeks   Status On-going     PT SHORT TERM GOAL #2   Title Patient to be able to complete at least 60% of walking program recommended by MD without pain exacerbation in order to assist in adhering to post-op recommendations and to improve  recovery    Baseline 4/10- has improved greatly    Time 3   Period Weeks   Status Achieved     PT SHORT TERM GOAL #3   Title Patient to be independent in correctly and consistently performing targeted HEP, to be updated as appropriate    Baseline 4/10- compliant 2x/day    Time 3   Period Weeks   Status Achieved           PT Long Term Goals - 11/18/16 1048      PT LONG TERM GOAL #1   Title Patient to demonstrate functional strength 5/5 in all tested muscle groups in order to assist in reducing pain and facilitating return to PLOF    Baseline 4/10- much improved    Time 6   Period Weeks   Status Partially Met      PT LONG TERM GOAL #2   Title Patient to be able to complete 100% of MD recommended walking program without pain exacerbation in order to allow her to follow post-op recommendations and to aide recovery    Baseline 4/10- generally back on track    Time 6   Period Weeks   Status Achieved  PT LONG TERM GOAL #3   Title Patient to report she has been able to sleep through the night without waking due to pain in order to improve overall QOL    Baseline 4/10- still waking up due to pain especially if she rolls over onto the hip, also has had some muscle spams in back    Time 6   Period Weeks   Status On-going     PT LONG TERM GOAL #4   Title Patient to demonstrate improved gait mechanics including elimination of antalgic pattern and L Hip hike in order to reduce pain and improve efficiency of overall mobility    Time 6   Period Weeks   Status Achieved               Plan - 01/06/17 1208    Clinical Impression Statement Patient arrives today continuing to report ongoing L hip; continued with functional exercises as tolerated however patient continues to express aggravation of L hip with active ROM even with active hip hike and core activation. She states that ionto has helped "a little" but without major noticeable changes in pain. Ended session with ice massage to L hip while educating patient regarding POC/possible causes of L hip pain.    Rehab Potential Fair   PT Frequency 2x / week   PT Duration 3 weeks   PT Treatment/Interventions ADLs/Self Care Home Management;Biofeedback;Cryotherapy;Moist Heat;Gait training;Stair training;Functional mobility training;Therapeutic activities;Therapeutic exercise;Balance training;Neuromuscular re-education;Patient/family education;Manual techniques;Passive range of motion;Energy conservation;Taping;Electrical Stimulation;Iontophoresis '4mg'$ /ml Dexamethasone;Ultrasound;DME Instruction;Dry needling   PT Next Visit Plan CANNOT LIFT MORE THAN 10#. Check  SI/LLD. Work on cautious hip extensors and abductors (carefully graded progression with pain free activities). Manual PRN. Trial cautious TFL stretch (Ober aggravates so try less aggressive version).  Assess pain relief following 2nd session of ionto, if positive continue for 4 more sessions  May consider TENS if ionto does not help pain.     PT Home Exercise Plan 3/21- seated marches with neutral spine, seated clams, sit to stand with no UEs (low reps); 4/10: hip ADD stretch, total gym, Nustep; 5/22: revision of HEP- walking, hip hikes, isometric hip ABD    Consulted and Agree with Plan of Care Patient      Patient will benefit from skilled therapeutic intervention in order to improve the following deficits and impairments:  Abnormal gait, Improper body mechanics, Pain, Decreased coordination, Decreased mobility, Decreased activity tolerance, Decreased strength, Difficulty walking  Visit Diagnosis: Abnormal posture  Muscle weakness (generalized)  Difficulty in walking, not elsewhere classified  Other symptoms and signs involving the musculoskeletal system     Problem List Patient Active Problem List   Diagnosis Date Noted  . Radiculopathy 09/11/2016  . Weakness generalized 11/15/2012  . Paresthesias 11/15/2012  . Pain in limb 11/15/2012  . Numbness and tingling 11/15/2012     Deniece Ree PT, DPT [facility] [address] [place], [place], [address] Phone: [phone]   Fax:  [phone]  Name: Melissa Blackburn MRN: [id number] Date of Birth: 1956-06-10

## 2017-01-08 ENCOUNTER — Ambulatory Visit (HOSPITAL_COMMUNITY): Payer: PRIVATE HEALTH INSURANCE | Admitting: Physical Therapy

## 2017-01-08 DIAGNOSIS — R293 Abnormal posture: Secondary | ICD-10-CM

## 2017-01-08 DIAGNOSIS — M6281 Muscle weakness (generalized): Secondary | ICD-10-CM | POA: Diagnosis not present

## 2017-01-08 DIAGNOSIS — R29898 Other symptoms and signs involving the musculoskeletal system: Secondary | ICD-10-CM | POA: Diagnosis not present

## 2017-01-08 DIAGNOSIS — R262 Difficulty in walking, not elsewhere classified: Secondary | ICD-10-CM

## 2017-01-08 NOTE — Therapy (Signed)
[place], [place], [address] Phone: [phone]   Fax:  [phone]  Physical Therapy Treatment  Patient Details  Name: Melissa Blackburn MRN: [id number] Date of Birth: Jan 23, 1956 Referring Provider: Phylliss Bob  Encounter Date: 01/08/2017      PT End of Session - 01/08/17 1159    Visit Number 11   Number of Visits 12   Date for PT Re-Evaluation 01/22/17   Authorization Type Worker's Comp (12 more sessions approved as of 5/8)   Authorization Time Period 2/58/52 to 02/14/81; recert done 4/23    Authorization - Visit Number 3   Authorization - Number of Visits 12   PT Start Time 1117   PT Stop Time 1156   PT Time Calculation (min) 39 min   Activity Tolerance Patient tolerated treatment well   Behavior During Therapy [facility] for tasks assessed/performed      Past Medical History:  Diagnosis Date  . Anemia    hx of  . Asthma    "adult onset" (09/11/2016)  . DDD (degenerative disc disease)    "hands, knees" (09/11/2016)  . Hypertension   . Migraine    "none since stimulator placed 07/2016" (09/11/2016)  . Needle stick injury with contaminated needle    patient undergoing testing  . Numbness and tingling 11/15/2012  . OSA on CPAP    cpap 7 (09/11/2016)  . Pain in limb 11/15/2012  . Paresthesias 11/15/2012  . Pneumonia 1980s X 1  . PONV (postoperative nausea and vomiting)    "severe nausea and vomiting"  . Weakness generalized 11/15/2012    Past Surgical History:  Procedure Laterality Date  . BACK SURGERY    . ESOPHAGOGASTRODUODENOSCOPY (EGD) WITH PROPOFOL N/A 01/10/2016   Procedure: ESOPHAGOGASTRODUODENOSCOPY (EGD) WITH PROPOFOL;  Surgeon: Milus Banister, MD;  Location: [facility];  Service: Endoscopy;  Laterality: N/A;  . KNEE ARTHROSCOPY  04/23/2012   Procedure: ARTHROSCOPY KNEE;  Surgeon: Augustin Schooling, MD;  Location: [facility];  Service: Orthopedics;  Laterality: Left;  lateral meniscectomy  . KNEE ARTHROSCOPY  Bilateral    "bone fragments"  . KNEE ARTHROSCOPY W/ ACL RECONSTRUCTION Left 2009  . KNEE ARTHROSCOPY W/ MENISCAL REPAIR Left 1981  . KNEE ARTHROSCOPY W/ MENISCAL REPAIR Left 2012  . LUMBAR FUSION Left 09/11/2016   LUMBAR 4-5 TRANSFORAMINAL LUMBAR INTERBODY FUSION WITH INSTRUMENTATION AND Gertha Calkin Archie Endo 09/11/2016  . PERIPHERAL NERVE STIMULATOR Right 07/2016   "in my chest; to treat migraine headaches"  . RECTOCELE REPAIR    . SHOULDER ARTHROSCOPY W/ SUPERIOR LABRAL ANTERIOR POSTERIOR REPAIR Bilateral    "I have rivets in both shoulders to hold them together" (09/11/2016)  . SHOULDER ARTHROSCOPY WITH SUBACROMIAL DECOMPRESSION Right 1994 X 2; 1995; ~ 2010  . UPPER ESOPHAGEAL ENDOSCOPIC ULTRASOUND (EUS)  01/10/2016   Procedure: UPPER ESOPHAGEAL ENDOSCOPIC ULTRASOUND (EUS);  Surgeon: Milus Banister, MD;  Location: [facility];  Service: Endoscopy;;  . VAGINAL HYSTERECTOMY  1986   "partial"    There were no vitals filed for this visit.      Subjective Assessment - 01/08/17 1119    Subjective Patient arrives stating she was sore after last time, but not flared up- more like she got a workout sort of pain. She is working this weekend. NO major changes since last time.    Pertinent History DDD, permanent nerve stimular implant for migranes, multiple knee surgeries, lumbar fusion 09/11/16   Patient Stated Goals get rid of pain, get back to  outdoor activities like hunting, get back to PLOF    Currently in Pain? Yes   Pain Score 3    Pain Location Hip   Pain Orientation Left            [facility] PT Assessment - 01/08/17 0001      Observation/Other Assessments   Observations LLD, able to somewhat correct with MET/small reduction in pain; education provided on LLD self-correction techniques                      [facility] Adult PT Treatment/Exercise - 01/08/17 0001      Lumbar Exercises: Standing   Other Standing Lumbar Exercises hip hikes with 2# with and without swing 10 second  holds 2x10     Lumbar Exercises: Supine   Bridge 20 reps   Straight Leg Raise 10 reps   Straight Leg Raises Limitations core set with eccentric lower    Other Supine Lumbar Exercises pelvic clocks 12-6 and 3-9 x15      Lumbar Exercises: Quadruped   Straight Leg Raise 5 reps   Straight Leg Raises Limitations core set    Opposite Arm/Leg Raise Limitations 5 hip hikes in quadruped each side      Manual Therapy   Manual Therapy Soft tissue mobilization   Manual therapy comments separate rest of session   Soft tissue mobilization ice massage L lateral hip, self-care provided throughout       MET self-correction techniques (L longer than R initially)          PT Education - 01/08/17 1158    Education provided Yes   Education Details LLD and possible causes including muscle imbalance, self-SIJ correction technique; during ice massage: possible mechanics that led to hip involvement aftter initial injury, no large reason to suspect knee involvement at this point, typical SIJ involvement symtpoms    Person(s) Educated Patient   Methods Explanation   Comprehension Verbalized understanding          PT Short Term Goals - 11/18/16 1047      PT SHORT TERM GOAL #1   Title Patient to experience pain no more than 2/10 during all functional tasks and activities in order to improve QOL and functional task performance    Baseline 4/10- pain can still get to 5-6/10   Time 3   Period Weeks   Status On-going     PT SHORT TERM GOAL #2   Title Patient to be able to complete at least 60% of walking program recommended by MD without pain exacerbation in order to assist in adhering to post-op recommendations and to improve  recovery    Baseline 4/10- has improved greatly    Time 3   Period Weeks   Status Achieved     PT SHORT TERM GOAL #3   Title Patient to be independent in correctly and consistently performing targeted HEP, to be updated as appropriate    Baseline 4/10- compliant 2x/day     Time 3   Period Weeks   Status Achieved           PT Long Term Goals - 11/18/16 1048      PT LONG TERM GOAL #1   Title Patient to demonstrate functional strength 5/5 in all tested muscle groups in order to assist in reducing pain and facilitating return to PLOF    Baseline 4/10- much improved    Time 6   Period Weeks   Status Partially Met  PT LONG TERM GOAL #2   Title Patient to be able to complete 100% of MD recommended walking program without pain exacerbation in order to allow her to follow post-op recommendations and to aide recovery    Baseline 4/10- generally back on track    Time 6   Period Weeks   Status Achieved     PT LONG TERM GOAL #3   Title Patient to report she has been able to sleep through the night without waking due to pain in order to improve overall QOL    Baseline 4/10- still waking up due to pain especially if she rolls over onto the hip, also has had some muscle spams in back    Time 6   Period Weeks   Status On-going     PT LONG TERM GOAL #4   Title Patient to demonstrate improved gait mechanics including elimination of antalgic pattern and L Hip hike in order to reduce pain and improve efficiency of overall mobility    Time 6   Period Weeks   Status Achieved               Plan - 01/08/17 1201    Clinical Impression Statement Patient arrives stating that she felt like she got a workout but was not flared up after last session. Continued with general exercises performed last session, progressing as able and with addition of quadruped based activities that were limited by L wrist pain/weakness. Continued with ice massage to L hip during which education was provided (see section for details). Pain reduced to 2/10 at EOS.    Rehab Potential Fair   Clinical Impairments Affecting Rehab Potential (+) motivated to participate in skilled PT services, high PLOF; (-) extensive lumbar surgeries, circumstances leading to difficulty with pain  control, now chronicity of pain    PT Frequency 2x / week   PT Duration 3 weeks   PT Treatment/Interventions ADLs/Self Care Home Management;Biofeedback;Cryotherapy;Moist Heat;Gait training;Stair training;Functional mobility training;Therapeutic activities;Therapeutic exercise;Balance training;Neuromuscular re-education;Patient/family education;Manual techniques;Passive range of motion;Energy conservation;Taping;Electrical Stimulation;Iontophoresis '4mg'$ /ml Dexamethasone;Ultrasound;DME Instruction;Dry needling   PT Next Visit Plan CANNOT LIFT MORE THAN 10#. Correct LLD with MET PRN. Cautious work hip extensors and abductors (isometrics with small ROM increases in painfree zone). Continue quadruped work. May consider tial of TENS.    PT Home Exercise Plan 3/21- seated marches with neutral spine, seated clams, sit to stand with no UEs (low reps); 4/10: hip ADD stretch, total gym, Nustep; 5/22: revision of HEP- walking, hip hikes, isometric hip ABD; 5/31: SI correction technique    Consulted and Agree with Plan of Care Patient      Patient will benefit from skilled therapeutic intervention in order to improve the following deficits and impairments:  Abnormal gait, Improper body mechanics, Pain, Decreased coordination, Decreased mobility, Decreased activity tolerance, Decreased strength, Difficulty walking  Visit Diagnosis: Abnormal posture  Muscle weakness (generalized)  Difficulty in walking, not elsewhere classified  Other symptoms and signs involving the musculoskeletal system     Problem List Patient Active Problem List   Diagnosis Date Noted  . Radiculopathy 09/11/2016  . Weakness generalized 11/15/2012  . Paresthesias 11/15/2012  . Pain in limb 11/15/2012  . Numbness and tingling 11/15/2012    Deniece Ree PT, DPT [place] [address] [place], [place], [address] Phone: [phone]   Fax:  [phone]  Name:  Melissa Blackburn MRN: [id number] Date of Birth: [birth year]

## 2017-01-08 NOTE — Patient Instructions (Signed)
   SIJ Self Correction   L hip drawn up towards chest as shown. Only bend it up to just before your R thigh starts to come off the ground.  Simultaneously push your L knee into your L hand and push your R heel down into the ground.   Hold for 3 seconds.  You can do this exercise laying on your back or sitting up on a chair- you just need to be pulling up with your left leg and pushing down with your right leg.   Repeat 5-6 times, twice a day.

## 2017-01-13 ENCOUNTER — Ambulatory Visit (HOSPITAL_COMMUNITY): Payer: PRIVATE HEALTH INSURANCE | Attending: Orthopedic Surgery | Admitting: Physical Therapy

## 2017-01-13 DIAGNOSIS — R293 Abnormal posture: Secondary | ICD-10-CM | POA: Diagnosis present

## 2017-01-13 DIAGNOSIS — M6281 Muscle weakness (generalized): Secondary | ICD-10-CM | POA: Diagnosis present

## 2017-01-13 DIAGNOSIS — R262 Difficulty in walking, not elsewhere classified: Secondary | ICD-10-CM | POA: Insufficient documentation

## 2017-01-13 DIAGNOSIS — R29898 Other symptoms and signs involving the musculoskeletal system: Secondary | ICD-10-CM | POA: Insufficient documentation

## 2017-01-13 NOTE — Therapy (Signed)
[place], [place], [address] Phone: [phone]   Fax:  [phone]  Physical Therapy Treatment  Patient Details  Name: Melissa Blackburn MRN: [id number] Date of Birth: 09-Jan-1956 Referring Provider: Phylliss Bob  Encounter Date: 01/13/2017      PT End of Session - 01/13/17 1209    Visit Number 12   Number of Visits 14   Date for PT Re-Evaluation 01/22/17   Authorization Type Worker's Comp (12 more sessions approved as of 5/8)   Authorization Time Period 7/78/24 to 09/14/51; recert done 6/14    Authorization - Visit Number 4   Authorization - Number of Visits 12   PT Start Time 4315   PT Stop Time 1146  did not include moist heat/ice pack in billing    PT Time Calculation (min) 28 min   Activity Tolerance Patient tolerated treatment well   Behavior During Therapy [facility] for tasks assessed/performed      Past Medical History:  Diagnosis Date  . Anemia    hx of  . Asthma    "adult onset" (09/11/2016)  . DDD (degenerative disc disease)    "hands, knees" (09/11/2016)  . Hypertension   . Migraine    "none since stimulator placed 07/2016" (09/11/2016)  . Needle stick injury with contaminated needle    patient undergoing testing  . Numbness and tingling 11/15/2012  . OSA on CPAP    cpap 7 (09/11/2016)  . Pain in limb 11/15/2012  . Paresthesias 11/15/2012  . Pneumonia 1980s X 1  . PONV (postoperative nausea and vomiting)    "severe nausea and vomiting"  . Weakness generalized 11/15/2012    Past Surgical History:  Procedure Laterality Date  . BACK SURGERY    . ESOPHAGOGASTRODUODENOSCOPY (EGD) WITH PROPOFOL N/A 01/10/2016   Procedure: ESOPHAGOGASTRODUODENOSCOPY (EGD) WITH PROPOFOL;  Surgeon: Milus Banister, MD;  Location: [facility];  Service: Endoscopy;  Laterality: N/A;  . KNEE ARTHROSCOPY  04/23/2012   Procedure: ARTHROSCOPY KNEE;  Surgeon: Augustin Schooling, MD;  Location: [facility];  Service: Orthopedics;  Laterality:  Left;  lateral meniscectomy  . KNEE ARTHROSCOPY Bilateral    "bone fragments"  . KNEE ARTHROSCOPY W/ ACL RECONSTRUCTION Left 2009  . KNEE ARTHROSCOPY W/ MENISCAL REPAIR Left 1981  . KNEE ARTHROSCOPY W/ MENISCAL REPAIR Left 2012  . LUMBAR FUSION Left 09/11/2016   LUMBAR 4-5 TRANSFORAMINAL LUMBAR INTERBODY FUSION WITH INSTRUMENTATION AND Gertha Calkin Archie Endo 09/11/2016  . PERIPHERAL NERVE STIMULATOR Right 07/2016   "in my chest; to treat migraine headaches"  . RECTOCELE REPAIR    . SHOULDER ARTHROSCOPY W/ SUPERIOR LABRAL ANTERIOR POSTERIOR REPAIR Bilateral    "I have rivets in both shoulders to hold them together" (09/11/2016)  . SHOULDER ARTHROSCOPY WITH SUBACROMIAL DECOMPRESSION Right 1994 X 2; 1995; ~ 2010  . UPPER ESOPHAGEAL ENDOSCOPIC ULTRASOUND (EUS)  01/10/2016   Procedure: UPPER ESOPHAGEAL ENDOSCOPIC ULTRASOUND (EUS);  Surgeon: Milus Banister, MD;  Location: [facility];  Service: Endoscopy;;  . VAGINAL HYSTERECTOMY  1986   "partial"    There were no vitals filed for this visit.      Subjective Assessment - 01/13/17 1120    Subjective Patient arrives stating she is flared up today, she is not sure what caused it but she was up on her feet more than usual this weekend. Her back is a little irritated and it is difficult to roll over in bed right now.    Pertinent History DDD, permanent nerve stimular  implant for migranes, multiple knee surgeries, lumbar fusion 09/11/16   Patient Stated Goals get rid of pain, get back to outdoor activities like hunting, get back to PLOF    Currently in Pain? Yes   Pain Score 4    Pain Location Other (Comment)  hip and the back    Pain Orientation Other (Comment)  L hip, center of back    Pain Descriptors / Indicators Aching;Burning   Pain Type Chronic pain   Pain Radiating Towards none    Pain Onset More than a month ago   Pain Frequency Constant   Aggravating Factors  not sure    Pain Relieving Factors ice helps hip, resting back    Effect of Pain  on Daily Activities severe impact                          [facility] Adult PT Treatment/Exercise - 01/13/17 0001      Modalities   Modalities Moist Heat     Moist Heat Therapy   Number Minutes Moist Heat 8 Minutes  not included in billing    Moist Heat Location Lumbar Spine     Cryotherapy   Number Minutes Cryotherapy 8 Minutes  not included in billing    Cryotherapy Location Hip   Type of Cryotherapy Ice pack     Manual Therapy   Manual Therapy Soft tissue mobilization   Manual therapy comments separate rest of session   Soft tissue mobilization extensive STM B paraspinals and glutes                 PT Education - 01/13/17 1209    Education provided No          PT Short Term Goals - 11/18/16 1047      PT SHORT TERM GOAL #1   Title Patient to experience pain no more than 2/10 during all functional tasks and activities in order to improve QOL and functional task performance    Baseline 4/10- pain can still get to 5-6/10   Time 3   Period Weeks   Status On-going     PT SHORT TERM GOAL #2   Title Patient to be able to complete at least 60% of walking program recommended by MD without pain exacerbation in order to assist in adhering to post-op recommendations and to improve  recovery    Baseline 4/10- has improved greatly    Time 3   Period Weeks   Status Achieved     PT SHORT TERM GOAL #3   Title Patient to be independent in correctly and consistently performing targeted HEP, to be updated as appropriate    Baseline 4/10- compliant 2x/day    Time 3   Period Weeks   Status Achieved           PT Long Term Goals - 11/18/16 1048      PT LONG TERM GOAL #1   Title Patient to demonstrate functional strength 5/5 in all tested muscle groups in order to assist in reducing pain and facilitating return to PLOF    Baseline 4/10- much improved    Time 6   Period Weeks   Status Partially Met     PT LONG TERM GOAL #2   Title Patient to be able  to complete 100% of MD recommended walking program without pain exacerbation in order to allow her to follow post-op recommendations and to aide recovery    Baseline 4/10-  generally back on track    Time 6   Period Weeks   Status Achieved     PT LONG TERM GOAL #3   Title Patient to report she has been able to sleep through the night without waking due to pain in order to improve overall QOL    Baseline 4/10- still waking up due to pain especially if she rolls over onto the hip, also has had some muscle spams in back    Time 6   Period Weeks   Status On-going     PT LONG TERM GOAL #4   Title Patient to demonstrate improved gait mechanics including elimination of antalgic pattern and L Hip hike in order to reduce pain and improve efficiency of overall mobility    Time 6   Period Weeks   Status Achieved               Plan - 01/13/17 1210    Clinical Impression Statement Patient arrives today reporting that she flared up her pain this weekend, she is not sure exactly what happened to cause this. Due to return to increased pain levels, began session with moist heat to low back/ice pack to L hip in supine, not included in billing today. Otherwise focused on extensive STM to lumbar paraspinals and gluteal region, noting severe spasm globally in this area with reports of reduced pain following manual.    Rehab Potential Fair   Clinical Impairments Affecting Rehab Potential (+) motivated to participate in skilled PT services, high PLOF; (-) extensive lumbar surgeries, circumstances leading to difficulty with pain control, now chronicity of pain    PT Frequency 2x / week   PT Duration 3 weeks   PT Treatment/Interventions ADLs/Self Care Home Management;Biofeedback;Cryotherapy;Moist Heat;Gait training;Stair training;Functional mobility training;Therapeutic activities;Therapeutic exercise;Balance training;Neuromuscular re-education;Patient/family education;Manual techniques;Passive range of  motion;Energy conservation;Taping;Electrical Stimulation;Iontophoresis '4mg'$ /ml Dexamethasone;Ultrasound;DME Instruction;Dry needling   PT Next Visit Plan CANNOT LIFT MORE THAN 10#. Correct LLD with MET PRN. Extensive manual to spasms in paraspinals and glutes. Consider trial of TENS. Functional exercise as tolerated otherwise, pain free ranges.    PT Home Exercise Plan 3/21- seated marches with neutral spine, seated clams, sit to stand with no UEs (low reps); 4/10: hip ADD stretch, total gym, Nustep; 5/22: revision of HEP- walking, hip hikes, isometric hip ABD; 5/31: SI correction technique    Consulted and Agree with Plan of Care Patient      Patient will benefit from skilled therapeutic intervention in order to improve the following deficits and impairments:  Abnormal gait, Improper body mechanics, Pain, Decreased coordination, Decreased mobility, Decreased activity tolerance, Decreased strength, Difficulty walking  Visit Diagnosis: Abnormal posture  Muscle weakness (generalized)  Difficulty in walking, not elsewhere classified  Other symptoms and signs involving the musculoskeletal system     Problem List Patient Active Problem List   Diagnosis Date Noted  . Radiculopathy 09/11/2016  . Weakness generalized 11/15/2012  . Paresthesias 11/15/2012  . Pain in limb 11/15/2012  . Numbness and tingling 11/15/2012    Deniece Ree PT, DPT [place] [address] [place], [place], [address] Phone: [phone]   Fax:  [phone]  Name: AVALEEN BROWNLEY MRN: [id number] Date of Birth: 1955-10-13

## 2017-01-15 ENCOUNTER — Ambulatory Visit (HOSPITAL_COMMUNITY): Payer: PRIVATE HEALTH INSURANCE | Admitting: Physical Therapy

## 2017-01-20 ENCOUNTER — Ambulatory Visit (HOSPITAL_COMMUNITY): Payer: PRIVATE HEALTH INSURANCE | Attending: Internal Medicine

## 2017-01-20 DIAGNOSIS — R29898 Other symptoms and signs involving the musculoskeletal system: Secondary | ICD-10-CM | POA: Diagnosis present

## 2017-01-20 DIAGNOSIS — R293 Abnormal posture: Secondary | ICD-10-CM | POA: Insufficient documentation

## 2017-01-20 DIAGNOSIS — M6281 Muscle weakness (generalized): Secondary | ICD-10-CM | POA: Diagnosis present

## 2017-01-20 DIAGNOSIS — R262 Difficulty in walking, not elsewhere classified: Secondary | ICD-10-CM | POA: Insufficient documentation

## 2017-01-20 NOTE — Therapy (Signed)
[place], [place], [address] Phone: [phone]   Fax:  [phone]  Physical Therapy Treatment  Patient Details  Name: Melissa Blackburn MRN: [id number] Date of Birth: 22-May-1956 Referring Provider: Phylliss Bob  Encounter Date: 01/20/2017      PT End of Session - 01/20/17 1124    Visit Number 13   Number of Visits 14   Date for PT Re-Evaluation 01/22/17   Authorization Type Worker's Comp (12 more sessions approved as of 5/8)   Authorization Time Period 12/13/37 to 02/14/72; recert done 4/19    PT Start Time 1119   PT Stop Time 1208   PT Time Calculation (min) 49 min   Activity Tolerance Patient tolerated treatment well;No increased pain   Behavior During Therapy WFL for tasks assessed/performed      Past Medical History:  Diagnosis Date  . Anemia    hx of  . Asthma    "adult onset" (09/11/2016)  . DDD (degenerative disc disease)    "hands, knees" (09/11/2016)  . Hypertension   . Migraine    "none since stimulator placed 07/2016" (09/11/2016)  . Needle stick injury with contaminated needle    patient undergoing testing  . Numbness and tingling 11/15/2012  . OSA on CPAP    cpap 7 (09/11/2016)  . Pain in limb 11/15/2012  . Paresthesias 11/15/2012  . Pneumonia 1980s X 1  . PONV (postoperative nausea and vomiting)    "severe nausea and vomiting"  . Weakness generalized 11/15/2012    Past Surgical History:  Procedure Laterality Date  . BACK SURGERY    . ESOPHAGOGASTRODUODENOSCOPY (EGD) WITH PROPOFOL N/A 01/10/2016   Procedure: ESOPHAGOGASTRODUODENOSCOPY (EGD) WITH PROPOFOL;  Surgeon: Milus Banister, MD;  Location: [facility];  Service: Endoscopy;  Laterality: N/A;  . KNEE ARTHROSCOPY  04/23/2012   Procedure: ARTHROSCOPY KNEE;  Surgeon: Augustin Schooling, MD;  Location: [facility];  Service: Orthopedics;  Laterality: Left;  lateral meniscectomy  . KNEE ARTHROSCOPY Bilateral    "bone fragments"  . KNEE ARTHROSCOPY W/ ACL  RECONSTRUCTION Left 2009  . KNEE ARTHROSCOPY W/ MENISCAL REPAIR Left 1981  . KNEE ARTHROSCOPY W/ MENISCAL REPAIR Left 2012  . LUMBAR FUSION Left 09/11/2016   LUMBAR 4-5 TRANSFORAMINAL LUMBAR INTERBODY FUSION WITH INSTRUMENTATION AND Gertha Calkin Archie Endo 09/11/2016  . PERIPHERAL NERVE STIMULATOR Right 07/2016   "in my chest; to treat migraine headaches"  . RECTOCELE REPAIR    . SHOULDER ARTHROSCOPY W/ SUPERIOR LABRAL ANTERIOR POSTERIOR REPAIR Bilateral    "I have rivets in both shoulders to hold them together" (09/11/2016)  . SHOULDER ARTHROSCOPY WITH SUBACROMIAL DECOMPRESSION Right 1994 X 2; 1995; ~ 2010  . UPPER ESOPHAGEAL ENDOSCOPIC ULTRASOUND (EUS)  01/10/2016   Procedure: UPPER ESOPHAGEAL ENDOSCOPIC ULTRASOUND (EUS);  Surgeon: Milus Banister, MD;  Location: [facility];  Service: Endoscopy;;  . VAGINAL HYSTERECTOMY  1986   "partial"    There were no vitals filed for this visit.                       [place] Adult PT Treatment/Exercise - 01/20/17 0001      Electrical Stimulation   Electrical Stimulation Location lumbar in prone   Electrical Stimulation Action to reduced pain   Electrical Stimulation Parameters Estim interferentail parameters vary through session per pt comfort   Electrical Stimulation Goals Pain     Manual Therapy   Manual Therapy Soft tissue mobilization   Manual therapy comments  separate rest of session   Soft tissue mobilization extensive STM B paraspinals and glutes                   PT Short Term Goals - 11/18/16 1047      PT SHORT TERM GOAL #1   Title Patient to experience pain no more than 2/10 during all functional tasks and activities in order to improve QOL and functional task performance    Baseline 4/10- pain can still get to 5-6/10   Time 3   Period Weeks   Status On-going     PT SHORT TERM GOAL #2   Title Patient to be able to complete at least 60% of walking program recommended by MD without pain exacerbation in order to  assist in adhering to post-op recommendations and to improve  recovery    Baseline 4/10- has improved greatly    Time 3   Period Weeks   Status Achieved     PT SHORT TERM GOAL #3   Title Patient to be independent in correctly and consistently performing targeted HEP, to be updated as appropriate    Baseline 4/10- compliant 2x/day    Time 3   Period Weeks   Status Achieved           PT Long Term Goals - 11/18/16 1048      PT LONG TERM GOAL #1   Title Patient to demonstrate functional strength 5/5 in all tested muscle groups in order to assist in reducing pain and facilitating return to PLOF    Baseline 4/10- much improved    Time 6   Period Weeks   Status Partially Met     PT LONG TERM GOAL #2   Title Patient to be able to complete 100% of MD recommended walking program without pain exacerbation in order to allow her to follow post-op recommendations and to aide recovery    Baseline 4/10- generally back on track    Time 6   Period Weeks   Status Achieved     PT LONG TERM GOAL #3   Title Patient to report she has been able to sleep through the night without waking due to pain in order to improve overall QOL    Baseline 4/10- still waking up due to pain especially if she rolls over onto the hip, also has had some muscle spams in back    Time 6   Period Weeks   Status On-going     PT LONG TERM GOAL #4   Title Patient to demonstrate improved gait mechanics including elimination of antalgic pattern and L Hip hike in order to reduce pain and improve efficiency of overall mobility    Time 6   Period Weeks   Status Achieved               Plan - 01/20/17 1158    Clinical Impression Statement Session focus on pain control with extensive STM to lumbar paraspinals and gluteal region to reduce tightness/spasms in Lt glut med and paraspinals.  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  Reviewed benefits of modality  and discussed purchase for pain control.  Checked SI alignment for pain control, no LLD noted, no MEt required this session.   EOS reports of pain reduced to 2/10 Lt lower back and Lt hip.   Rehab Potential Fair   Clinical Impairments Affecting Rehab Potential (+) motivated to participate in  skilled PT services, high PLOF; (-) extensive lumbar surgeries, circumstances leading to difficulty with pain control, now chronicity of pain    PT Frequency 2x / week   PT Duration 3 weeks   PT Treatment/Interventions ADLs/Self Care Home Management;Biofeedback;Cryotherapy;Moist Heat;Gait training;Stair training;Functional mobility training;Therapeutic activities;Therapeutic exercise;Balance training;Neuromuscular re-education;Patient/family education;Manual techniques;Passive range of motion;Energy conservation;Taping;Electrical Stimulation;Iontophoresis '4mg'$ /ml Dexamethasone;Ultrasound;DME Instruction;Dry needling   PT Next Visit Plan Reassess next session.     PT Home Exercise Plan 3/21- seated marches with neutral spine, seated clams, sit to stand with no UEs (low reps); 4/10: hip ADD stretch, total gym, Nustep; 5/22: revision of HEP- walking, hip hikes, isometric hip ABD; 5/31: SI correction technique       Patient will benefit from skilled therapeutic intervention in order to improve the following deficits and impairments:  Abnormal gait, Improper body mechanics, Pain, Decreased coordination, Decreased mobility, Decreased activity tolerance, Decreased strength, Difficulty walking  Visit Diagnosis: Abnormal posture  Muscle weakness (generalized)  Difficulty in walking, not elsewhere classified  Other symptoms and signs involving the musculoskeletal system     Problem List Patient Active Problem List   Diagnosis Date Noted  . Radiculopathy 09/11/2016  . Weakness generalized 11/15/2012  . Paresthesias 11/15/2012  . Pain in limb 11/15/2012  . Numbness and tingling 11/15/2012   Ihor Austin,  LPTA; CBIS [id number]  Aldona Lento 01/20/2017, 5:55 PM  [facility] [address] [place], [place], [address] Phone: [phone]   Fax:  [phone]  Name: DEZTINEE LOHMEYER MRN: [id number] Date of Birth: 12-11-1955

## 2017-01-21 ENCOUNTER — Ambulatory Visit
Admission: RE | Admit: 2017-01-21 | Discharge: 2017-01-21 | Disposition: A | Discharge status: Home / Self Care | Payer: PRIVATE HEALTH INSURANCE | Source: Ambulatory Visit | Attending: Orthopedic Surgery | Admitting: Orthopedic Surgery

## 2017-01-21 DIAGNOSIS — M25552 Pain in left hip: Secondary | ICD-10-CM

## 2017-01-22 ENCOUNTER — Ambulatory Visit (HOSPITAL_COMMUNITY): Payer: PRIVATE HEALTH INSURANCE | Admitting: Physical Therapy

## 2017-01-22 DIAGNOSIS — R29898 Other symptoms and signs involving the musculoskeletal system: Secondary | ICD-10-CM

## 2017-01-22 DIAGNOSIS — R293 Abnormal posture: Secondary | ICD-10-CM

## 2017-01-22 DIAGNOSIS — R262 Difficulty in walking, not elsewhere classified: Secondary | ICD-10-CM

## 2017-01-22 DIAGNOSIS — M6281 Muscle weakness (generalized): Secondary | ICD-10-CM

## 2017-01-22 NOTE — Therapy (Signed)
[place], [place], [address] Phone: [phone]   Fax:  [phone]  Physical Therapy Treatment (Discharge)  Patient Details  Name: Melissa Blackburn MRN: [id number] Date of Birth: Feb 04, 1956 Referring Provider: Elta Guadeloupe DUmonski/Eric Hardin Negus   Encounter Date: 01/22/2017      PT End of Session - 01/22/17 1145    Visit Number 14   Number of Visits 14   Date for PT Re-Evaluation 01/22/17   Authorization Type Worker's Comp (12 more sessions approved as of 5/8)   Authorization Time Period 3/53/61 to 11/12/29; recert done 5/40    PT Start Time 1118   PT Stop Time 1141   PT Time Calculation (min) 23 min   Activity Tolerance Patient tolerated treatment well   Behavior During Therapy [facility] for tasks assessed/performed      Past Medical History:  Diagnosis Date  . Anemia    hx of  . Asthma    "adult onset" (09/11/2016)  . DDD (degenerative disc disease)    "hands, knees" (09/11/2016)  . Hypertension   . Migraine    "none since stimulator placed 07/2016" (09/11/2016)  . Needle stick injury with contaminated needle    patient undergoing testing  . Numbness and tingling 11/15/2012  . OSA on CPAP    cpap 7 (09/11/2016)  . Pain in limb 11/15/2012  . Paresthesias 11/15/2012  . Pneumonia 1980s X 1  . PONV (postoperative nausea and vomiting)    "severe nausea and vomiting"  . Weakness generalized 11/15/2012    Past Surgical History:  Procedure Laterality Date  . BACK SURGERY    . ESOPHAGOGASTRODUODENOSCOPY (EGD) WITH PROPOFOL N/A 01/10/2016   Procedure: ESOPHAGOGASTRODUODENOSCOPY (EGD) WITH PROPOFOL;  Surgeon: Milus Banister, MD;  Location: [facility];  Service: Endoscopy;  Laterality: N/A;  . KNEE ARTHROSCOPY  04/23/2012   Procedure: ARTHROSCOPY KNEE;  Surgeon: Augustin Schooling, MD;  Location: [facility];  Service: Orthopedics;  Laterality: Left;  lateral meniscectomy  . KNEE ARTHROSCOPY Bilateral    "bone fragments"  . KNEE  ARTHROSCOPY W/ ACL RECONSTRUCTION Left 2009  . KNEE ARTHROSCOPY W/ MENISCAL REPAIR Left 1981  . KNEE ARTHROSCOPY W/ MENISCAL REPAIR Left 2012  . LUMBAR FUSION Left 09/11/2016   LUMBAR 4-5 TRANSFORAMINAL LUMBAR INTERBODY FUSION WITH INSTRUMENTATION AND Gertha Calkin Archie Endo 09/11/2016  . PERIPHERAL NERVE STIMULATOR Right 07/2016   "in my chest; to treat migraine headaches"  . RECTOCELE REPAIR    . SHOULDER ARTHROSCOPY W/ SUPERIOR LABRAL ANTERIOR POSTERIOR REPAIR Bilateral    "I have rivets in both shoulders to hold them together" (09/11/2016)  . SHOULDER ARTHROSCOPY WITH SUBACROMIAL DECOMPRESSION Right 1994 X 2; 1995; ~ 2010  . UPPER ESOPHAGEAL ENDOSCOPIC ULTRASOUND (EUS)  01/10/2016   Procedure: UPPER ESOPHAGEAL ENDOSCOPIC ULTRASOUND (EUS);  Surgeon: Milus Banister, MD;  Location: [facility];  Service: Endoscopy;;  . VAGINAL HYSTERECTOMY  1986   "partial"    There were no vitals filed for this visit.      Subjective Assessment - 01/22/17 1120    Subjective Patient arrives stating she felt better after manual, she also got her SI injection yesterday and feels much better. She felt better after manual and TENS last session.    Pertinent History DDD, permanent nerve stimular implant for migranes, multiple knee surgeries, lumbar fusion 09/11/16   How long can you sit comfortably? 6/14- couple hours    How long can you stand comfortably? 6/14- 30-45 minutes    How long can  you walk comfortably? 6/14- 2.5 miles    Patient Stated Goals get rid of pain, get back to outdoor activities like hunting, get back to PLOF    Currently in Pain? Yes   Pain Score --  hip 1/10, back 3/10   Pain Location Other (Comment)  back andL hip    Pain Orientation Other (Comment)  lumbar region and L hip    Pain Descriptors / Indicators Other (Comment)  back is achey, hip is just discomfort/annoying    Pain Type Chronic pain   Pain Radiating Towards none    Pain Onset More than a month ago   Pain Frequency Constant    Aggravating Factors  sitting for too long    Pain Relieving Factors ice, SI injection, TENS and manual    Effect of Pain on Daily Activities moderate impact             [facility] PT Assessment - 01/22/17 0001      Assessment   Medical Diagnosis lumbar fusion/hip pain    Referring Provider Elta Guadeloupe DUmonski/Eric Phillips    Onset Date/Surgical Date 09/11/16   Next MD Visit Dr. Lynann Bologna June 22nd, does not return to [facility] Comments no lifting over 10#      Balance Screen   Has the patient fallen in the past 6 months No   Has the patient had a decrease in activity level because of a fear of falling?  No   Is the patient reluctant to leave their home because of a fear of falling?  No     Prior Function   Level of Independence Independent   Vocation Workers comp;Other (comment)   Vocation Requirements RN   Leisure hunting, outdoor activities      AROM   Lumbar Flexion WFL   Lumbar Extension WFL    Lumbar - Right Side Bend fingertips just past knee    Lumbar - Left Side Bend fingertips just past knee      Strength   Right Hip Flexion 5/5   Right Hip Extension 5/5   Right Hip ABduction 5/5   Left Hip Flexion 5/5   Left Hip Extension 5/5   Left Hip ABduction 4-/5   Right Knee Flexion 5/5   Right Knee Extension 5/5   Left Knee Flexion 5/5   Left Knee Extension 5/5   Right Ankle Dorsiflexion 5/5   Left Ankle Dorsiflexion 5/5                             PT Education - 01/22/17 1143    Education provided Yes   Education Details progress with skilled PT services, DC today; resources including local massage therapists, cash based clinic for continunig PT, TENS order being sent to MD    Person(s) Educated Patient   Methods Explanation;Handout  TENS order to take to MD appointment    Comprehension Verbalized understanding          PT Short Term Goals - 01/22/17 1130      PT SHORT TERM GOAL #1   Title Patient to  experience pain no more than 2/10 during all functional tasks and activities in order to improve QOL and functional task performance    Baseline 6/14- improved, at worst 5-6/10 but this is not often; usually sits aroud 2-3/10   Time 3   Period Weeks   Status Partially Met  PT SHORT TERM GOAL #2   Title Patient to be able to complete at least 60% of walking program recommended by MD without pain exacerbation in order to assist in adhering to post-op recommendations and to improve  recovery    Time 3   Period Weeks   Status Achieved     PT SHORT TERM GOAL #3   Title Patient to be independent in correctly and consistently performing targeted HEP, to be updated as appropriate    Time 3   Period Weeks   Status Achieved     PT SHORT TERM GOAL #4   Title Patient to be independent in correctly and consistently performing appropriate HEP, to be updated PRN            PT Long Term Goals - 01/22/17 1131      PT LONG TERM GOAL #1   Title Patient to demonstrate functional strength 5/5 in all tested muscle groups in order to assist in reducing pain and facilitating return to PLOF    Time 6   Period Weeks   Status Achieved     PT LONG TERM GOAL #2   Title Patient to be able to complete 100% of MD recommended walking program without pain exacerbation in order to allow her to follow post-op recommendations and to aide recovery    Time 6   Period Weeks   Status Achieved     PT LONG TERM GOAL #3   Title Patient to report she has been able to sleep through the night without waking due to pain in order to improve overall QOL    Baseline 6/14- still waking up due to pain, can wake up 2-3 times per night    Time 6   Period Weeks   Status On-going     PT LONG TERM GOAL #4   Title Patient to demonstrate improved gait mechanics including elimination of antalgic pattern and L Hip hike in order to reduce pain and improve efficiency of overall mobility    Baseline 6/14- going well    Time 6    Period Weeks   Status Achieved               Plan - 01/22/17 1146    Clinical Impression Statement Re-assessment performed. Patient arrives today stating that she got an SI injection and is feeling better, she also felt better after manual and TENS performed last session. Examination reveals good progress with skilled PT Services; while patient continues to demonstrate some functional impairment especially with L hip abduction weakness and localized tenderness, she appears to have reached maximal benefit from skilled PT Services at this time. Recommend DC today.    Rehab Potential Fair   Clinical Impairments Affecting Rehab Potential (+) motivated to participate in skilled PT services, high PLOF; (-) extensive lumbar surgeries, circumstances leading to difficulty with pain control, now chronicity of pain    PT Next Visit Plan DC today    PT Home Exercise Plan 3/21- seated marches with neutral spine, seated clams, sit to stand with no UEs (low reps); 4/10: hip ADD stretch, total gym, Nustep; 5/22: revision of HEP- walking, hip hikes, isometric hip ABD; 5/31: SI correction technique       Patient will benefit from skilled therapeutic intervention in order to improve the following deficits and impairments:  Abnormal gait, Improper body mechanics, Pain, Decreased coordination, Decreased mobility, Decreased activity tolerance, Decreased strength, Difficulty walking  Visit Diagnosis: Abnormal posture  Muscle weakness (generalized)  Difficulty in walking, not elsewhere classified  Other symptoms and signs involving the musculoskeletal system     Problem List Patient Active Problem List   Diagnosis Date Noted  . Radiculopathy 09/11/2016  . Weakness generalized 11/15/2012  . Paresthesias 11/15/2012  . Pain in limb 11/15/2012  . Numbness and tingling 11/15/2012     PHYSICAL THERAPY DISCHARGE SUMMARY  Visits from Start of Care: 14  Current functional level related to goals /  functional outcomes: Patient appears to have maximized benefit from skilled PT services- DC today.    Remaining deficits: L hip abduction weakness and point tenderness    Education / Equipment: progress with skilled PT services, DC today; resources including local massage therapists, cash based clinic for continunig PT, TENS order being sent to MD  Plan: Patient agrees to discharge.  Patient goals were partially met. Patient is being discharged due to being pleased with the current functional level.  ?????      Deniece Ree PT, DPT [facility] [address] [place], [place], [address] Phone: [phone]   Fax:  [phone]  Name: Melissa Blackburn MRN: [id number] Date of Birth: 05/21/1956

## 2017-01-26 DIAGNOSIS — I10 Essential (primary) hypertension: Secondary | ICD-10-CM | POA: Diagnosis not present

## 2017-01-26 DIAGNOSIS — F5101 Primary insomnia: Secondary | ICD-10-CM | POA: Diagnosis not present

## 2017-01-26 DIAGNOSIS — Z6827 Body mass index (BMI) 27.0-27.9, adult: Secondary | ICD-10-CM | POA: Diagnosis not present

## 2017-01-26 MED FILL — AMITRIPTYLINE HCL 50 MG TAB: 50 | 30 days supply | Qty: 30 | Fill #0

## 2017-01-26 MED FILL — ATENOLOL 25 MG TABLET: 25 | 30 days supply | Qty: 30 | Fill #0

## 2017-02-02 ENCOUNTER — Telehealth (HOSPITAL_COMMUNITY): Payer: Self-pay | Admitting: Internal Medicine

## 2017-02-02 NOTE — Telephone Encounter (Signed)
02/02/17 I left a message asking patient to call us.  We got a new referral in today and Cyril Mourning looked at it, it's for a TENS unit and she said to find out what the dr told the patient.  The referral has been scanned into EPIC

## 2017-02-02 NOTE — Telephone Encounter (Signed)
6/25 PER KRISTEN..... WHEN PATIENT CALLS BACK KRISTEN SHOULD TALK TO HER BEFORE SCHEDULING ANYTHING

## 2017-02-02 NOTE — Telephone Encounter (Signed)
02/02/17  °

## 2017-02-04 ENCOUNTER — Other Ambulatory Visit: Payer: Self-pay | Admitting: Orthopedic Surgery

## 2017-02-10 ENCOUNTER — Other Ambulatory Visit: Payer: Self-pay | Admitting: Orthopedic Surgery

## 2017-02-10 DIAGNOSIS — M533 Sacrococcygeal disorders, not elsewhere classified: Secondary | ICD-10-CM

## 2017-02-19 ENCOUNTER — Ambulatory Visit
Admission: RE | Admit: 2017-02-19 | Discharge: 2017-02-19 | Disposition: A | Discharge status: Home / Self Care | Payer: Self-pay | Source: Ambulatory Visit | Attending: Orthopedic Surgery | Admitting: Orthopedic Surgery

## 2017-02-19 DIAGNOSIS — M533 Sacrococcygeal disorders, not elsewhere classified: Secondary | ICD-10-CM

## 2017-02-19 MED ORDER — METHYLPREDNISOLONE ACETATE 40 MG/ML INJ SUSP (RADIOLOG: 120.0000 mg | Freq: Once | INTRAMUSCULAR | Status: DC

## 2017-02-26 DIAGNOSIS — Z6827 Body mass index (BMI) 27.0-27.9, adult: Secondary | ICD-10-CM | POA: Diagnosis not present

## 2017-02-26 DIAGNOSIS — Z0000 Encounter for general adult medical examination without abnormal findings: Secondary | ICD-10-CM | POA: Diagnosis not present

## 2017-02-26 DIAGNOSIS — Z020 Encounter for examination for admission to educational institution: Secondary | ICD-10-CM | POA: Diagnosis not present

## 2017-03-04 DIAGNOSIS — H547 Unspecified visual loss: Secondary | ICD-10-CM | POA: Diagnosis not present

## 2017-03-04 DIAGNOSIS — H43391 Other vitreous opacities, right eye: Secondary | ICD-10-CM | POA: Diagnosis not present

## 2017-03-12 MED FILL — ATENOLOL 25 MG TABLET: 25 | 30 days supply | Qty: 30 | Fill #1

## 2017-03-12 MED FILL — AMITRIPTYLINE HCL 50 MG TAB: 50 | 30 days supply | Qty: 30 | Fill #1

## 2017-03-26 ENCOUNTER — Other Ambulatory Visit: Payer: Self-pay | Admitting: Orthopedic Surgery

## 2017-03-26 DIAGNOSIS — I10 Essential (primary) hypertension: Secondary | ICD-10-CM | POA: Diagnosis not present

## 2017-03-26 DIAGNOSIS — Z1159 Encounter for screening for other viral diseases: Secondary | ICD-10-CM | POA: Diagnosis not present

## 2017-03-30 DIAGNOSIS — Z0000 Encounter for general adult medical examination without abnormal findings: Secondary | ICD-10-CM | POA: Diagnosis not present

## 2017-03-30 DIAGNOSIS — M25511 Pain in right shoulder: Secondary | ICD-10-CM | POA: Diagnosis not present

## 2017-03-30 DIAGNOSIS — I10 Essential (primary) hypertension: Secondary | ICD-10-CM | POA: Diagnosis not present

## 2017-03-30 DIAGNOSIS — Z6826 Body mass index (BMI) 26.0-26.9, adult: Secondary | ICD-10-CM | POA: Diagnosis not present

## 2017-03-30 DIAGNOSIS — F5101 Primary insomnia: Secondary | ICD-10-CM | POA: Diagnosis not present

## 2017-03-30 DIAGNOSIS — Z713 Dietary counseling and surveillance: Secondary | ICD-10-CM | POA: Diagnosis not present

## 2017-03-30 DIAGNOSIS — M545 Low back pain: Secondary | ICD-10-CM | POA: Diagnosis not present

## 2017-03-30 MED FILL — BACLOFEN 10 MG TABLET: 10 | 30 days supply | Qty: 90 | Fill #0

## 2017-04-06 ENCOUNTER — Encounter (HOSPITAL_COMMUNITY)
Admission: RE | Admit: 2017-04-06 | Discharge: 2017-04-06 | Disposition: A | Discharge status: Home / Self Care | Payer: PRIVATE HEALTH INSURANCE | Source: Ambulatory Visit | Attending: Orthopedic Surgery | Admitting: Orthopedic Surgery

## 2017-04-06 ENCOUNTER — Encounter (HOSPITAL_COMMUNITY): Payer: Self-pay

## 2017-04-06 DIAGNOSIS — G4733 Obstructive sleep apnea (adult) (pediatric): Secondary | ICD-10-CM | POA: Diagnosis not present

## 2017-04-06 DIAGNOSIS — I10 Essential (primary) hypertension: Secondary | ICD-10-CM | POA: Diagnosis not present

## 2017-04-06 DIAGNOSIS — J45909 Unspecified asthma, uncomplicated: Secondary | ICD-10-CM | POA: Diagnosis not present

## 2017-04-06 DIAGNOSIS — Z791 Long term (current) use of non-steroidal anti-inflammatories (NSAID): Secondary | ICD-10-CM | POA: Diagnosis not present

## 2017-04-06 DIAGNOSIS — Z79899 Other long term (current) drug therapy: Secondary | ICD-10-CM | POA: Diagnosis not present

## 2017-04-06 DIAGNOSIS — Z01812 Encounter for preprocedural laboratory examination: Secondary | ICD-10-CM

## 2017-04-06 DIAGNOSIS — M2580 Other specified joint disorders, unspecified joint: Secondary | ICD-10-CM | POA: Diagnosis not present

## 2017-04-06 DIAGNOSIS — M25511 Pain in right shoulder: Secondary | ICD-10-CM | POA: Diagnosis not present

## 2017-04-06 LAB — CBC WITH DIFFERENTIAL/PLATELET
BASOS PCT: 0 %
Basophils Absolute: 0 10*3/uL (ref 0.0–0.1)
EOS ABS: 0.1 10*3/uL (ref 0.0–0.7)
EOS PCT: 2 %
HEMATOCRIT: 42.3 % (ref 36.0–46.0)
Hemoglobin: 13.6 g/dL (ref 12.0–15.0)
Lymphocytes Relative: 35 %
Lymphs Abs: 1.6 10*3/uL (ref 0.7–4.0)
MCH: 29.8 pg (ref 26.0–34.0)
MCHC: 32.2 g/dL (ref 30.0–36.0)
MCV: 92.8 fL (ref 78.0–100.0)
MONO ABS: 0.5 10*3/uL (ref 0.1–1.0)
MONOS PCT: 11 %
Neutro Abs: 2.4 10*3/uL (ref 1.7–7.7)
Neutrophils Relative %: 52 %
Platelets: 262 10*3/uL (ref 150–400)
RBC: 4.56 MIL/uL (ref 3.87–5.11)
RDW: 13.9 % (ref 11.5–15.5)
WBC: 4.6 10*3/uL (ref 4.0–10.5)

## 2017-04-06 LAB — URINALYSIS, ROUTINE W REFLEX MICROSCOPIC
BILIRUBIN URINE: NEGATIVE
GLUCOSE, UA: NEGATIVE mg/dL
HGB URINE DIPSTICK: NEGATIVE
Ketones, ur: NEGATIVE mg/dL
Leukocytes, UA: NEGATIVE
Nitrite: NEGATIVE
PH: 6 (ref 5.0–8.0)
Protein, ur: NEGATIVE mg/dL
SPECIFIC GRAVITY, URINE: 1.005 (ref 1.005–1.030)

## 2017-04-06 LAB — SURGICAL PCR SCREEN
MRSA, PCR: NEGATIVE
STAPHYLOCOCCUS AUREUS: NEGATIVE

## 2017-04-06 LAB — COMPREHENSIVE METABOLIC PANEL
ALBUMIN: 4 g/dL (ref 3.5–5.0)
ALT: 31 U/L (ref 14–54)
ANION GAP: 6 (ref 5–15)
AST: 25 U/L (ref 15–41)
Alkaline Phosphatase: 96 U/L (ref 38–126)
BILIRUBIN TOTAL: 0.5 mg/dL (ref 0.3–1.2)
BUN: 10 mg/dL (ref 6–20)
CALCIUM: 9.2 mg/dL (ref 8.9–10.3)
CO2: 28 mmol/L (ref 22–32)
CREATININE: 0.85 mg/dL (ref 0.44–1.00)
Chloride: 109 mmol/L (ref 101–111)
GFR calc Af Amer: >60 mL/min (ref >60)
GFR calc non Af Amer: >60 mL/min (ref >60)
GLUCOSE: 80 mg/dL (ref 65–99)
Potassium: 3.5 mmol/L (ref 3.5–5.1)
SODIUM: 143 mmol/L (ref 135–145)
TOTAL PROTEIN: 7.2 g/dL (ref 6.5–8.1)

## 2017-04-06 LAB — PROTIME-INR
INR: 1.03
Prothrombin Time: 13.5 seconds (ref 11.4–15.2)

## 2017-04-06 LAB — TYPE AND SCREEN
ABO/RH(D): O POS
Antibody Screen: NEGATIVE

## 2017-04-06 LAB — APTT: aPTT: 32 seconds (ref 24–36)

## 2017-04-06 MED FILL — AMITRIPTYLINE HCL 50 MG TAB: 50 | 90 days supply | Qty: 90 | Fill #0

## 2017-04-06 MED FILL — CARISOPRODOL 350 MG TABLET: 350 | 22 days supply | Qty: 90 | Fill #0

## 2017-04-06 NOTE — Pre-Procedure Instructions (Addendum)
Melissa Blackburn  04/06/2017      [place], [place] - [address] [place] [address] Phone: [phone] Fax: [phone]    Your procedure is scheduled on Thurs. Aug. 30  Report to [facility] Admitting at 10:00 A.M.  Call this number if you have problems the morning of surgery:  [phone]   Remember:  Do not eat food or drink liquids after midnight on Wed. Aug. 29   Take these medicines the morning of surgery with A SIP OF WATER : tylenol if needed, albuterol if needed-bring to hospital, carisoprodol (soma)    Stop: aspirin, advil, motrin, ibuprofen, naproxen, aleve, excedrin migraine, BC Powders, Gody's, vitamins and herbal medicines.    Do not wear jewelry, make-up or nail polish.  Do not wear lotions, powders, or perfumes, or deoderant.  Do not shave 48 hours prior to surgery.  Men may shave face and neck.  Do not bring valuables to the hospital.  [facility] is not responsible for any belongings or valuables.  Contacts, dentures or bridgework may not be worn into surgery.  Leave your suitcase in the car.  After surgery it may be brought to your room.  For patients admitted to the hospital, discharge time will be determined by your treatment team.  Patients discharged the day of surgery will not be allowed to drive home.    Special instructions:   [facility]- Preparing For Surgery  Before surgery, you can play an important role. Because skin is not sterile, your skin needs to be as free of germs as possible. You can reduce the number of germs on your skin by washing with CHG (chlorahexidine gluconate) Soap before surgery.  CHG is an antiseptic cleaner which kills germs and bonds with the skin to continue killing germs even after washing.  Please do not use if you have an allergy to CHG or antibacterial soaps. If your skin becomes reddened/irritated stop using the CHG.  Do not shave  (including legs and underarms) for at least 48 hours prior to first CHG shower. It is OK to shave your face.  Please follow these instructions carefully.   1. Shower the NIGHT BEFORE SURGERY and the MORNING OF SURGERY with CHG.   2. If you chose to wash your hair, wash your hair first as usual with your normal shampoo.  3. After you shampoo, rinse your hair and body thoroughly to remove the shampoo.  4. Use CHG as you would any other liquid soap. You can apply CHG directly to the skin and wash gently with a scrungie or a clean washcloth.   5. Apply the CHG Soap to your body ONLY FROM THE NECK DOWN.  Do not use on open wounds or open sores. Avoid contact with your eyes, ears, mouth and genitals (private parts). Wash genitals (private parts) with your normal soap.  6. Wash thoroughly, paying special attention to the area where your surgery will be performed.  7. Thoroughly rinse your body with warm water from the neck down.  8. DO NOT shower/wash with your normal soap after using and rinsing off the CHG Soap.  9. Pat yourself dry with a CLEAN TOWEL.   10. Wear CLEAN PAJAMAS   11. Place CLEAN SHEETS on your bed the night of your first shower and DO NOT SLEEP WITH PETS.    Day of Surgery: Do not apply any deodorants/lotions. Please wear clean clothes to  the hospital/surgery center.      Please read over the following fact sheets that you were given. Coughing and Deep Breathing and Surgical Site Infection Prevention

## 2017-04-06 NOTE — Progress Notes (Addendum)
PCP: Dr. Delphina Cahill in [place], [place]  Sleep study done > 5 yrs.

## 2017-04-07 DIAGNOSIS — M25511 Pain in right shoulder: Secondary | ICD-10-CM | POA: Diagnosis not present

## 2017-04-09 ENCOUNTER — Ambulatory Visit (HOSPITAL_COMMUNITY)
Admission: RE | Admit: 2017-04-09 | Discharge: 2017-04-09 | Disposition: A | Discharge status: Home / Self Care | Payer: PRIVATE HEALTH INSURANCE | Source: Ambulatory Visit | Attending: Orthopedic Surgery | Admitting: Orthopedic Surgery

## 2017-04-09 ENCOUNTER — Encounter (HOSPITAL_COMMUNITY): Payer: Self-pay | Admitting: *Deleted

## 2017-04-09 ENCOUNTER — Ambulatory Visit (HOSPITAL_COMMUNITY): Payer: PRIVATE HEALTH INSURANCE | Admitting: Certified Registered Nurse Anesthetist

## 2017-04-09 ENCOUNTER — Ambulatory Visit (HOSPITAL_COMMUNITY): Payer: PRIVATE HEALTH INSURANCE

## 2017-04-09 ENCOUNTER — Encounter (HOSPITAL_COMMUNITY)
Admission: RE | Disposition: A | Discharge status: Home / Self Care | Payer: Self-pay | Source: Ambulatory Visit | Attending: Orthopedic Surgery

## 2017-04-09 DIAGNOSIS — M2580 Other specified joint disorders, unspecified joint: Secondary | ICD-10-CM | POA: Insufficient documentation

## 2017-04-09 DIAGNOSIS — I10 Essential (primary) hypertension: Secondary | ICD-10-CM | POA: Insufficient documentation

## 2017-04-09 DIAGNOSIS — M533 Sacrococcygeal disorders, not elsewhere classified: Secondary | ICD-10-CM | POA: Diagnosis not present

## 2017-04-09 DIAGNOSIS — G4733 Obstructive sleep apnea (adult) (pediatric): Secondary | ICD-10-CM | POA: Insufficient documentation

## 2017-04-09 DIAGNOSIS — J45909 Unspecified asthma, uncomplicated: Secondary | ICD-10-CM | POA: Diagnosis not present

## 2017-04-09 DIAGNOSIS — Z791 Long term (current) use of non-steroidal anti-inflammatories (NSAID): Secondary | ICD-10-CM | POA: Insufficient documentation

## 2017-04-09 DIAGNOSIS — Z79899 Other long term (current) drug therapy: Secondary | ICD-10-CM | POA: Insufficient documentation

## 2017-04-09 DIAGNOSIS — Z419 Encounter for procedure for purposes other than remedying health state, unspecified: Secondary | ICD-10-CM

## 2017-04-09 HISTORY — PX: SACROILIAC JOINT FUSION: SHX6088

## 2017-04-09 SURGERY — SACROILIAC JOINT FUSION
Anesthesia: General | Site: Hip | Laterality: Left

## 2017-04-09 MED ORDER — PROPOFOL 10 MG/ML IV BOLUS
INTRAVENOUS | Status: DC | PRN
  Administered 2017-04-09: 20 mg via INTRAVENOUS
  Administered 2017-04-09: 180 mg via INTRAVENOUS

## 2017-04-09 MED ORDER — DIAZEPAM 5 MG PO TABS
5.0000 mg | ORAL_TABLET | Freq: Once | ORAL | Status: AC
  Administered 2017-04-09: 5 mg via ORAL

## 2017-04-09 MED ORDER — OXYCODONE-ACETAMINOPHEN 5-325 MG PO TABS
ORAL_TABLET | ORAL | Status: AC
  Filled 2017-04-09: qty 1

## 2017-04-09 MED ORDER — OXYCODONE-ACETAMINOPHEN 5-325 MG PO TABS
ORAL_TABLET | ORAL | Status: DC
Start: 2017-04-09 — End: 2017-04-09
  Filled 2017-04-09: qty 1

## 2017-04-09 MED ORDER — ONDANSETRON HCL 4 MG/2ML IJ SOLN
INTRAMUSCULAR | Status: DC | PRN
  Administered 2017-04-09: 4 mg via INTRAVENOUS

## 2017-04-09 MED ORDER — SCOPOLAMINE 1 MG/3DAYS TD PT72
1.0000 | MEDICATED_PATCH | TRANSDERMAL | Status: DC
  Administered 2017-04-09: 1 via TRANSDERMAL
  Filled 2017-04-09: qty 1

## 2017-04-09 MED ORDER — CEFAZOLIN SODIUM-DEXTROSE 2-4 GM/100ML-% IV SOLN
2.0000 g | INTRAVENOUS | Status: AC
  Administered 2017-04-09: 2 g via INTRAVENOUS

## 2017-04-09 MED ORDER — DEXAMETHASONE SODIUM PHOSPHATE 4 MG/ML IJ SOLN
INTRAMUSCULAR | Status: DC | PRN
  Administered 2017-04-09: 10 mg via INTRAVENOUS

## 2017-04-09 MED ORDER — PROPOFOL 10 MG/ML IV BOLUS
INTRAVENOUS | Status: AC
  Filled 2017-04-09: qty 20

## 2017-04-09 MED ORDER — FENTANYL CITRATE (PF) 100 MCG/2ML IJ SOLN
INTRAMUSCULAR | Status: AC
  Administered 2017-04-09: 50 ug via INTRAVENOUS
  Filled 2017-04-09: qty 2

## 2017-04-09 MED ORDER — LIDOCAINE 2% (20 MG/ML) 5 ML SYRINGE
INTRAMUSCULAR | Status: AC
  Filled 2017-04-09: qty 5

## 2017-04-09 MED ORDER — DIAZEPAM 5 MG PO TABS
ORAL_TABLET | ORAL | Status: AC
  Filled 2017-04-09: qty 1

## 2017-04-09 MED ORDER — MIDAZOLAM HCL 2 MG/2ML IJ SOLN
INTRAMUSCULAR | Status: AC
  Filled 2017-04-09: qty 2

## 2017-04-09 MED ORDER — ROCURONIUM BROMIDE 10 MG/ML (PF) SYRINGE
PREFILLED_SYRINGE | INTRAVENOUS | Status: AC
  Filled 2017-04-09: qty 5

## 2017-04-09 MED ORDER — SUGAMMADEX SODIUM 200 MG/2ML IV SOLN
INTRAVENOUS | Status: AC
  Filled 2017-04-09: qty 2

## 2017-04-09 MED ORDER — OXYCODONE-ACETAMINOPHEN 5-325 MG PO TABS
1.0000 | ORAL_TABLET | Freq: Once | ORAL | Status: AC
  Administered 2017-04-09: 1 via ORAL

## 2017-04-09 MED ORDER — FENTANYL CITRATE (PF) 100 MCG/2ML IJ SOLN
INTRAMUSCULAR | Status: DC | PRN
  Administered 2017-04-09 (×2): 25 ug via INTRAVENOUS
  Administered 2017-04-09: 100 ug via INTRAVENOUS

## 2017-04-09 MED ORDER — DEXAMETHASONE SODIUM PHOSPHATE 10 MG/ML IJ SOLN
INTRAMUSCULAR | Status: AC
  Filled 2017-04-09: qty 1

## 2017-04-09 MED ORDER — LIDOCAINE HCL (CARDIAC) 20 MG/ML IV SOLN
INTRAVENOUS | Status: DC | PRN
  Administered 2017-04-09: 50 mg via INTRAVENOUS

## 2017-04-09 MED ORDER — SUGAMMADEX SODIUM 200 MG/2ML IV SOLN
INTRAVENOUS | Status: DC | PRN
  Administered 2017-04-09: 175 mg via INTRAVENOUS

## 2017-04-09 MED ORDER — BUPIVACAINE LIPOSOME 1.3 % IJ SUSP
20.0000 mL | INTRAMUSCULAR | Status: AC
  Administered 2017-04-09: 20 mL
  Filled 2017-04-09: qty 20

## 2017-04-09 MED ORDER — FENTANYL CITRATE (PF) 100 MCG/2ML IJ SOLN
25.0000 ug | INTRAMUSCULAR | Status: DC | PRN
  Administered 2017-04-09 (×2): 50 ug via INTRAVENOUS

## 2017-04-09 MED ORDER — ROCURONIUM BROMIDE 100 MG/10ML IV SOLN
INTRAVENOUS | Status: DC | PRN
  Administered 2017-04-09: 50 mg via INTRAVENOUS

## 2017-04-09 MED ORDER — FENTANYL CITRATE (PF) 250 MCG/5ML IJ SOLN
INTRAMUSCULAR | Status: AC
  Filled 2017-04-09: qty 5

## 2017-04-09 MED ORDER — CEFAZOLIN SODIUM-DEXTROSE 2-4 GM/100ML-% IV SOLN
INTRAVENOUS | Status: AC
  Filled 2017-04-09: qty 100

## 2017-04-09 MED ORDER — PROPOFOL 500 MG/50ML IV EMUL
INTRAVENOUS | Status: DC | PRN
  Administered 2017-04-09: 25 ug/kg/min via INTRAVENOUS

## 2017-04-09 MED ORDER — POVIDONE-IODINE 7.5 % EX SOLN: Freq: Once | CUTANEOUS | Status: DC

## 2017-04-09 MED ORDER — BUPIVACAINE-EPINEPHRINE (PF) 0.25% -1:200000 IJ SOLN
INTRAMUSCULAR | Status: DC | PRN
  Administered 2017-04-09: 30 mL

## 2017-04-09 MED ORDER — BUPIVACAINE-EPINEPHRINE (PF) 0.25% -1:200000 IJ SOLN
INTRAMUSCULAR | Status: AC
  Filled 2017-04-09: qty 30

## 2017-04-09 MED ORDER — LACTATED RINGERS IV SOLN
INTRAVENOUS | Status: DC | PRN
  Administered 2017-04-09: 12:00:00 via INTRAVENOUS

## 2017-04-09 MED ORDER — PROMETHAZINE HCL 25 MG/ML IJ SOLN: 6.2500 mg | INTRAMUSCULAR | Status: DC | PRN

## 2017-04-09 MED ORDER — ONDANSETRON HCL 4 MG/2ML IJ SOLN
INTRAMUSCULAR | Status: AC
  Filled 2017-04-09: qty 2

## 2017-04-09 MED FILL — OXYCODONE W/APAP 5/325 TAB: 5-325 | 3 days supply | Qty: 20 | Fill #0

## 2017-04-09 SURGICAL SUPPLY — 63 items
BENZOIN TINCTURE PRP APPL 2/3 (GAUZE/BANDAGES/DRESSINGS) ×3 IMPLANT
BLADE CLIPPER SURG (BLADE) ×3 IMPLANT
BLADE SURG 10 STRL SS (BLADE) ×3 IMPLANT
CANISTER SUCT 3000ML PPV (MISCELLANEOUS) ×3 IMPLANT
CLOSURE STERI-STRIP 1/2X4 (GAUZE/BANDAGES/DRESSINGS) ×1
CLOSURE WOUND 1/2 X4 (GAUZE/BANDAGES/DRESSINGS) ×1
CLSR STERI-STRIP ANTIMIC 1/2X4 (GAUZE/BANDAGES/DRESSINGS) ×2 IMPLANT
COVER SURGICAL LIGHT HANDLE (MISCELLANEOUS) ×6 IMPLANT
DRAPE C-ARM 42X72 X-RAY (DRAPES) ×3 IMPLANT
DRAPE C-ARMOR (DRAPES) ×3 IMPLANT
DRAPE INCISE IOBAN 66X45 STRL (DRAPES) ×3 IMPLANT
DRAPE POUCH INSTRU U-SHP 10X18 (DRAPES) ×3 IMPLANT
DRAPE SURG 17X23 STRL (DRAPES) ×9 IMPLANT
DRSG TEGADERM 4X4.75 (GAUZE/BANDAGES/DRESSINGS) ×3 IMPLANT
DURAPREP 26ML APPLICATOR (WOUND CARE) ×3 IMPLANT
ELECT CAUTERY BLADE 6.4 (BLADE) ×3 IMPLANT
ELECT REM PT RETURN 9FT ADLT (ELECTROSURGICAL) ×3
ELECTRODE REM PT RTRN 9FT ADLT (ELECTROSURGICAL) ×1 IMPLANT
GAUZE SPONGE 4X4 12PLY STRL (GAUZE/BANDAGES/DRESSINGS) ×3 IMPLANT
GAUZE SPONGE 4X4 12PLY STRL LF (GAUZE/BANDAGES/DRESSINGS) ×3 IMPLANT
GAUZE SPONGE 4X4 16PLY XRAY LF (GAUZE/BANDAGES/DRESSINGS) ×3 IMPLANT
GLOVE BIO SURGEON STRL SZ7 (GLOVE) ×3 IMPLANT
GLOVE BIO SURGEON STRL SZ8 (GLOVE) ×3 IMPLANT
GLOVE BIOGEL PI IND STRL 7.0 (GLOVE) ×1 IMPLANT
GLOVE BIOGEL PI IND STRL 8 (GLOVE) ×1 IMPLANT
GLOVE BIOGEL PI INDICATOR 7.0 (GLOVE) ×2
GLOVE BIOGEL PI INDICATOR 8 (GLOVE) ×2
GOWN STRL REUS W/ TWL LRG LVL3 (GOWN DISPOSABLE) ×2 IMPLANT
GOWN STRL REUS W/ TWL XL LVL3 (GOWN DISPOSABLE) ×1 IMPLANT
GOWN STRL REUS W/TWL LRG LVL3 (GOWN DISPOSABLE) ×4
GOWN STRL REUS W/TWL XL LVL3 (GOWN DISPOSABLE) ×2
IMPL IFUSE 7.0MMX45MM (Rod) ×1 IMPLANT
IMPL IFUSE 7.0X50MM (Rod) ×1 IMPLANT
IMPL IFUSE 7.0X60MM (Rod) ×1 IMPLANT
IMPLANT IFUSE 7.0MMX45MM (Rod) ×3 IMPLANT
IMPLANT IFUSE 7.0X50MM (Rod) ×3 IMPLANT
IMPLANT IFUSE 7.0X60MM (Rod) ×3 IMPLANT
KIT BASIN OR (CUSTOM PROCEDURE TRAY) ×3 IMPLANT
KIT ROOM TURNOVER OR (KITS) ×3 IMPLANT
MANIFOLD NEPTUNE II (INSTRUMENTS) ×3 IMPLANT
NEEDLE 22X1 1/2 (OR ONLY) (NEEDLE) ×3 IMPLANT
NEEDLE HYPO 25GX1X1/2 BEV (NEEDLE) ×6 IMPLANT
NS IRRIG 1000ML POUR BTL (IV SOLUTION) ×3 IMPLANT
PACK UNIVERSAL I (CUSTOM PROCEDURE TRAY) ×3 IMPLANT
PAD ARMBOARD 7.5X6 YLW CONV (MISCELLANEOUS) ×6 IMPLANT
PENCIL BUTTON HOLSTER BLD 10FT (ELECTRODE) ×3 IMPLANT
SPONGE LAP 18X18 X RAY DECT (DISPOSABLE) ×3 IMPLANT
STAPLER VISISTAT 35W (STAPLE) ×3 IMPLANT
STRIP CLOSURE SKIN 1/2X4 (GAUZE/BANDAGES/DRESSINGS) ×2 IMPLANT
SUT MNCRL AB 4-0 PS2 18 (SUTURE) ×3 IMPLANT
SUT VIC AB 0 CT1 18XCR BRD 8 (SUTURE) IMPLANT
SUT VIC AB 0 CT1 8-18 (SUTURE)
SUT VIC AB 1 CT1 18XCR BRD 8 (SUTURE) ×1 IMPLANT
SUT VIC AB 1 CT1 8-18 (SUTURE) ×2
SUT VIC AB 2-0 CT2 18 VCP726D (SUTURE) ×3 IMPLANT
SYR BULB IRRIGATION 50ML (SYRINGE) ×6 IMPLANT
SYR CONTROL 10ML LL (SYRINGE) ×3 IMPLANT
TOWEL OR 17X24 6PK STRL BLUE (TOWEL DISPOSABLE) ×3 IMPLANT
TOWEL OR 17X26 10 PK STRL BLUE (TOWEL DISPOSABLE) ×6 IMPLANT
TUBE CONNECTING 12'X1/4 (SUCTIONS) ×1
TUBE CONNECTING 12X1/4 (SUCTIONS) ×2 IMPLANT
WATER STERILE IRR 1000ML POUR (IV SOLUTION) ×3 IMPLANT
YANKAUER SUCT BULB TIP NO VENT (SUCTIONS) ×3 IMPLANT

## 2017-04-09 NOTE — Transfer of Care (Signed)
Immediate Anesthesia Transfer of Care Note  Patient: Melissa Blackburn  Procedure(s) Performed: Procedure(s): LEFT SIDED SACROILIAC JOINT FUSION (Left)  Patient Location: PACU  Anesthesia Type:General  Level of Consciousness:  sedated, patient cooperative and responds to stimulation  Airway & Oxygen Therapy:Patient Spontanous Breathing and Patient connected to face mask oxgen  Post-op Assessment:  Report given to PACU RN and Post -op Vital signs reviewed and stable  Post vital signs:  Reviewed and stable  Last Vitals:  Vitals:   04/09/17 0919  BP: 110/69  Pulse: 70  Resp: 20  Temp: 36.7 C  SpO2: 11%    Complications: No apparent anesthesia complications

## 2017-04-09 NOTE — Anesthesia Postprocedure Evaluation (Signed)
Anesthesia Post Note  Patient: [name]  Procedure(s) Performed: Procedure(s) (LRB): LEFT SIDED SACROILIAC JOINT FUSION (Left)     Patient location during evaluation: PACU Anesthesia Type: General Level of consciousness: awake and alert Pain management: pain level controlled Vital Signs Assessment: post-procedure vital signs reviewed and stable Respiratory status: spontaneous breathing, nonlabored ventilation, respiratory function stable and patient connected to nasal cannula oxygen Cardiovascular status: blood pressure returned to baseline and stable Postop Assessment: no signs of nausea or vomiting Anesthetic complications: no    Last Vitals:  Vitals:   04/09/17 1430 04/09/17 1435  BP: 126/74   Pulse: (!) 57 70  Resp: 12 14  Temp:  36.5 C  SpO2: 97% 99%    Last Pain:  Vitals:   04/09/17 1359  TempSrc:   PainSc: 7     LLE Motor Response: Purposeful movement (04/09/17 1436)   RLE Motor Response: Purposeful movement (04/09/17 1436)        Ronnetta Currington S

## 2017-04-09 NOTE — Anesthesia Preprocedure Evaluation (Addendum)
Anesthesia Evaluation  Patient identified by MRN, date of birth, ID band Patient awake    Reviewed: Allergy & Precautions, H&P , NPO status , Patient's Chart, lab work & pertinent test results, reviewed documented beta blocker date and time   History of Anesthesia Complications (+) PONV  Airway Mallampati: II  TM Distance: >3 FB Neck ROM: Full    Dental no notable dental hx. (+) Teeth Intact, Dental Advisory Given   Pulmonary asthma , sleep apnea and Continuous Positive Airway Pressure Ventilation ,    Pulmonary exam normal breath sounds clear to auscultation       Cardiovascular hypertension, Pt. on medications and Pt. on home beta blockers  Rhythm:Regular Rate:Normal     Neuro/Psych  Headaches, negative psych ROS   GI/Hepatic negative GI ROS, Neg liver ROS,   Endo/Other  negative endocrine ROS  Renal/GU negative Renal ROS  negative genitourinary   Musculoskeletal  (+) Arthritis , Osteoarthritis,    Abdominal   Peds  Hematology negative hematology ROS (+) anemia ,   Anesthesia Other Findings   Reproductive/Obstetrics                             Anesthesia Physical  Anesthesia Plan  ASA: III  Anesthesia Plan: General   Post-op Pain Management:    Induction: Intravenous  PONV Risk Score and Plan: 4 or greater and Ondansetron, Dexamethasone, Scopolamine patch - Pre-op, Propofol infusion and Treatment may vary due to age or medical condition  Airway Management Planned: Oral ETT  Additional Equipment:   Intra-op Plan:   Post-operative Plan: Extubation in OR  Informed Consent: I have reviewed the patients History and Physical, chart, labs and discussed the procedure including the risks, benefits and alternatives for the proposed anesthesia with the patient or authorized representative who has indicated his/her understanding and acceptance.   Dental advisory given  Plan  Discussed with: CRNA  Anesthesia Plan Comments: (No midazolam)     Anesthesia Quick Evaluation

## 2017-04-09 NOTE — H&P (Signed)
PREOPERATIVE H&P  Chief Complaint: Left low back pain  HPI: Melissa Blackburn is a 61 y.o. female who presents with ongoing pain in the left low back  Patient's history and exam is c/w left SI dysfunction. Patient reported 100% improvement with her anesthetic injection.   Patient has failed multiple forms of conservative care and continues to have pain (see office notes for additional details regarding the patient's full course of treatment)  Past Medical History:  Diagnosis Date  . Anemia    hx of  . Asthma    "adult onset" (09/11/2016)  . DDD (degenerative disc disease)    "hands, knees" (09/11/2016)  . Hypertension   . Migraine    "none since stimulator placed 07/2016" (09/11/2016)  . Needle stick injury with contaminated needle    patient undergoing testing  . Numbness and tingling 11/15/2012  . OSA on CPAP    cpap 7 (09/11/2016)  . Pain in limb 11/15/2012  . Paresthesias 11/15/2012  . Pneumonia 1980s X 1  . PONV (postoperative nausea and vomiting)    "severe nausea and vomiting"  . Weakness generalized 11/15/2012   Past Surgical History:  Procedure Laterality Date  . BACK SURGERY    . ESOPHAGOGASTRODUODENOSCOPY (EGD) WITH PROPOFOL N/A 01/10/2016   Procedure: ESOPHAGOGASTRODUODENOSCOPY (EGD) WITH PROPOFOL;  Surgeon: Milus Banister, MD;  Location: [facility];  Service: Endoscopy;  Laterality: N/A;  . KNEE ARTHROSCOPY  04/23/2012   Procedure: ARTHROSCOPY KNEE;  Surgeon: Augustin Schooling, MD;  Location: [facility];  Service: Orthopedics;  Laterality: Left;  lateral meniscectomy  . KNEE ARTHROSCOPY Bilateral    "bone fragments"  . KNEE ARTHROSCOPY W/ ACL RECONSTRUCTION Left 2009  . KNEE ARTHROSCOPY W/ MENISCAL REPAIR Left 1981  . KNEE ARTHROSCOPY W/ MENISCAL REPAIR Left 2012  . LUMBAR FUSION Left 09/11/2016   LUMBAR 4-5 TRANSFORAMINAL LUMBAR INTERBODY FUSION WITH INSTRUMENTATION AND Gertha Calkin Archie Endo 09/11/2016  . PERIPHERAL NERVE STIMULATOR Right 07/2016   "in my chest; to treat  migraine headaches"  . RECTOCELE REPAIR    . SHOULDER ARTHROSCOPY W/ SUPERIOR LABRAL ANTERIOR POSTERIOR REPAIR Bilateral    "I have rivets in both shoulders to hold them together" (09/11/2016)  . SHOULDER ARTHROSCOPY WITH SUBACROMIAL DECOMPRESSION Right 1994 X 2; 1995; ~ 2010  . UPPER ESOPHAGEAL ENDOSCOPIC ULTRASOUND (EUS)  01/10/2016   Procedure: UPPER ESOPHAGEAL ENDOSCOPIC ULTRASOUND (EUS);  Surgeon: Milus Banister, MD;  Location: [facility];  Service: Endoscopy;;  . VAGINAL HYSTERECTOMY  1986   "partial"   Social History   Social History  . Marital status: Married    Spouse name: N/A  . Number of children: 2  . Years of education: N/A   Occupational History  . Nurse [employer]   Social History Main Topics  . Smoking status: Never Smoker  . Smokeless tobacco: Never Used  . Alcohol use No  . Drug use: No  . Sexual activity: Yes   Other Topics Concern  . None   Social History Narrative   ** Merged History Encounter **       Family History  Problem Relation Age of Onset  . Cancer Mother        Breast cancer  . Hypertension Father   . COPD Father   . Coronary artery disease Father   . Cancer Maternal Grandmother        Bone cancer  . Coronary artery disease Maternal Grandfather    Allergies  Allergen Reactions  . Verapamil Other (  See Comments)    Heart block per patient  . Avelox [Moxifloxacin Hcl In Nacl] Diarrhea  . Flexeril [Cyclobenzaprine Hcl] Other (See Comments)    Systolic Levels "bottoms out"  . Imitrex [Sumatriptan] Other (See Comments)    "Messes with my blood pressure - will cause it to go up and then down"   . Midazolam Hcl Nausea And Vomiting  . Neurontin [Gabapentin] Other (See Comments)    Muscle spasms  . Pregabalin Other (See Comments)    Muscle spasms  . Tape Other (See Comments)    Blisters skin   Prior to Admission medications   Medication Sig Start Date End Date Taking? Authorizing Provider  acetaminophen (TYLENOL) 500 MG  tablet Take 500 mg by mouth every 6 (six) hours as needed for mild pain or moderate pain.   Yes [provider]  acetaminophen-codeine (TYLENOL #3) 300-30 MG tablet Take 1-2 tablets by mouth every 6 (six) hours as needed for moderate pain.  01/02/17  Yes [provider]  amitriptyline (ELAVIL) 50 MG tablet Take 50 mg by mouth at bedtime. For migraines.   Yes [provider]  aspirin-acetaminophen-caffeine (EXCEDRIN MIGRAINE) [id number] MG tablet Take 2 tablets by mouth daily as needed for headache.   Yes [provider]  atenolol (TENORMIN) 25 MG tablet Take 25 mg by mouth at bedtime.  12/18/15  Yes [provider]  baclofen (LIORESAL) 10 MG tablet Take 10 mg by mouth 3 (three) times daily as needed for muscle spasms (normally just takes at bedtime).   Yes [provider]  carisoprodol (SOMA) 350 MG tablet Take 350 mg by mouth 4 (four) times daily as needed for muscle spasms (severe muscle spasms).    Yes [provider]  naproxen sodium (ANAPROX) 220 MG tablet Take 440 mg by mouth daily as needed (pain).   Yes [provider]  albuterol (PROAIR HFA) 108 (90 Base) MCG/ACT inhaler Inhale 2 puffs into the lungs every 6 (six) hours as needed for wheezing or shortness of breath.     [provider]     All other systems have been reviewed and were otherwise negative with the exception of those mentioned in the HPI and as above.  Physical Exam: Vitals:   04/09/17 0919  BP: 110/69  Pulse: 70  Resp: 20  Temp: 98.1 F (36.7 C)  SpO2: 96%    General: Alert, no acute distress Cardiovascular: No pedal edema Respiratory: No cyanosis, no use of accessory musculature Skin: No lesions in the area of chief complaint Neurologic: Sensation intact distally Psychiatric: Patient is competent for consent with normal mood and affect Lymphatic: No axillary or cervical lymphadenopathy  MUSCULOSKELETAL: + TTP left SI  region  Assessment/Plan: Left sacroiliac joint dysfunction Plan for Procedure(s): LEFT SIDED SACROILIAC JOINT FUSION   Sinclair Ship, MD 04/09/2017 11:12 AM

## 2017-04-09 NOTE — Anesthesia Procedure Notes (Signed)
Procedure Name: Intubation Date/Time: 04/09/2017 11:38 AM Performed by: Claudia Desanctis Pre-anesthesia Checklist: Patient identified, Emergency Drugs available, Suction available and Patient being monitored Patient Re-evaluated:Patient Re-evaluated prior to induction Oxygen Delivery Method: Circle system utilized Preoxygenation: Pre-oxygenation with 100% oxygen Induction Type: IV induction Ventilation: Mask ventilation without difficulty Laryngoscope Size: 2 and Miller Grade View: Grade I Tube type: Oral Tube size: 7.0 mm Number of attempts: 1 Airway Equipment and Method: Stylet Placement Confirmation: ETT inserted through vocal cords under direct vision,  positive ETCO2 and breath sounds checked- equal and bilateral Secured at: 22 cm Tube secured with: Tape Dental Injury: Teeth and Oropharynx as per pre-operative assessment

## 2017-04-10 ENCOUNTER — Encounter (HOSPITAL_COMMUNITY): Payer: Self-pay | Admitting: Orthopedic Surgery

## 2017-04-10 MED FILL — diazePAM 5 MG TABS: 5 | 8 days supply | Qty: 30 | Fill #0

## 2017-04-10 NOTE — Op Note (Signed)
NAME:  Melissa Blackburn, Melissa Blackburn NO.:  [id number]  MEDICAL RECORD NO.:  [id number]  PHYSICIAN:  Phylliss Bob, MD      DATE OF BIRTH:  11-May-1956  DATE OF PROCEDURE:  04/09/2017                              OPERATIVE REPORT   PREOPERATIVE DIAGNOSIS:  Left-sided sacroiliac joint dysfunction.  POSTOPERATIVE DIAGNOSIS:  Left-sided sacroiliac joint dysfunction.  PROCEDURE:  Left-sided sacroiliac joint fusion using the iFuse sacroiliac joint fusion system.  SURGEON:  Phylliss Bob, MD.  ASSISTANTPricilla Holm, PA-C.  ANESTHESIA:  General endotracheal anesthesia.  COMPLICATIONS:  None.  DISPOSITION:  Stable.  ESTIMATED BLOOD LOSS:  Minimal.  INDICATIONS FOR SURGERY:  Briefly, [name] is a pleasant 61 year old female, who has been having ongoing pain in her left hip.  The etiology of this pain was unclear for an extensive period of time.  Her pain did occur following a recent surgery.  Her diagnosis was elusive, until we proceeded with an anesthetic injection into her left sacroiliac joint. Per the patient, she did get 100% relief of her left hip pain temporarily.  This did confirm the diagnosis of left sacroiliac joint dysfunction.  Given her ongoing pain, we did discuss proceeding with the procedure reflected above.  The patient was fully aware of the risks and limitations of surgery and did elect to proceed.  OPERATIVE DETAILS:  On April 09, 2017, the patient was brought to surgery and general endotracheal anesthesia was administered.  The patient was placed prone on a well-padded flat Jackson bed with a spinal.  The patient was placed prone on a well-padded flat Jackson bed with gel rolls placed into the patient's chest and hips.  Antibiotics were given.  The region of the left buttock was prepped and draped in the usual sterile fashion.  A time-out procedure was performed.  A 3 cm incision was then made overlying the posterior border of the sacrum.   I then advanced 3 guidewires across the sacroiliac joint.  One just above the S1 foramen, one just beneath it, and one just above the S2 foramen. I then drilled and broached over the guidewires.  I then placed 7 mm SI implants of the appropriate length across the sacroiliac joint.  I did liberally use lateral inlet and outlet fluoroscopy.  I was very pleased with the press-fit of the implants.  The guidewires were then removed. The wound was then copiously irrigated.  The wound was then closed in layers using #1 Vicryl, followed by 0 Vicryl, followed by 2-0 Vicryl, followed by 4-0 Monocryl.  Benzoin and Steri-Strips were applied, followed by sterile dressing.  All instrument counts were correct at the termination of the procedure.  Of note, Pricilla Holm, PA-C, was my assistant throughout surgery, and did aid in retraction, suctioning, and closure.     Phylliss Bob, MD   ______________________________ Phylliss Bob, MD    MD/MEDQ  D:  04/09/2017  T:  04/09/2017  Job:  [id number]

## 2017-04-20 MED FILL — ATENOLOL 25 MG TABLET: 25 | 30 days supply | Qty: 30 | Fill #2

## 2017-04-29 DIAGNOSIS — M67911 Unspecified disorder of synovium and tendon, right shoulder: Secondary | ICD-10-CM | POA: Diagnosis not present

## 2017-05-11 ENCOUNTER — Other Ambulatory Visit: Payer: Self-pay | Admitting: Orthopedic Surgery

## 2017-05-11 DIAGNOSIS — M75111 Incomplete rotator cuff tear or rupture of right shoulder, not specified as traumatic: Secondary | ICD-10-CM | POA: Diagnosis not present

## 2017-05-13 DIAGNOSIS — D225 Melanocytic nevi of trunk: Secondary | ICD-10-CM | POA: Diagnosis not present

## 2017-05-13 DIAGNOSIS — L570 Actinic keratosis: Secondary | ICD-10-CM | POA: Diagnosis not present

## 2017-05-13 DIAGNOSIS — Z1283 Encounter for screening for malignant neoplasm of skin: Secondary | ICD-10-CM | POA: Diagnosis not present

## 2017-05-13 DIAGNOSIS — X32XXXA Exposure to sunlight, initial encounter: Secondary | ICD-10-CM | POA: Diagnosis not present

## 2017-05-19 ENCOUNTER — Encounter (HOSPITAL_COMMUNITY)
Admission: RE | Admit: 2017-05-19 | Discharge: 2017-05-19 | Disposition: A | Discharge status: Home / Self Care | Payer: 59 | Source: Ambulatory Visit | Attending: Orthopedic Surgery | Admitting: Orthopedic Surgery

## 2017-05-19 ENCOUNTER — Encounter (HOSPITAL_COMMUNITY): Payer: Self-pay

## 2017-05-19 DIAGNOSIS — M75101 Unspecified rotator cuff tear or rupture of right shoulder, not specified as traumatic: Secondary | ICD-10-CM | POA: Diagnosis not present

## 2017-05-19 DIAGNOSIS — S43401A Unspecified sprain of right shoulder joint, initial encounter: Secondary | ICD-10-CM | POA: Diagnosis not present

## 2017-05-19 DIAGNOSIS — M25511 Pain in right shoulder: Secondary | ICD-10-CM | POA: Diagnosis present

## 2017-05-19 DIAGNOSIS — J45909 Unspecified asthma, uncomplicated: Secondary | ICD-10-CM | POA: Diagnosis not present

## 2017-05-19 DIAGNOSIS — R0789 Other chest pain: Secondary | ICD-10-CM | POA: Diagnosis not present

## 2017-05-19 DIAGNOSIS — G4733 Obstructive sleep apnea (adult) (pediatric): Secondary | ICD-10-CM | POA: Diagnosis not present

## 2017-05-19 DIAGNOSIS — Z9989 Dependence on other enabling machines and devices: Secondary | ICD-10-CM | POA: Diagnosis not present

## 2017-05-19 DIAGNOSIS — X500XXA Overexertion from strenuous movement or load, initial encounter: Secondary | ICD-10-CM | POA: Diagnosis not present

## 2017-05-19 DIAGNOSIS — Z79899 Other long term (current) drug therapy: Secondary | ICD-10-CM | POA: Diagnosis not present

## 2017-05-19 DIAGNOSIS — G43909 Migraine, unspecified, not intractable, without status migrainosus: Secondary | ICD-10-CM | POA: Diagnosis not present

## 2017-05-19 DIAGNOSIS — I10 Essential (primary) hypertension: Secondary | ICD-10-CM | POA: Diagnosis not present

## 2017-05-19 LAB — CBC
HEMATOCRIT: 41.4 % (ref 36.0–46.0)
Hemoglobin: 13.3 g/dL (ref 12.0–15.0)
MCH: 29.7 pg (ref 26.0–34.0)
MCHC: 32.1 g/dL (ref 30.0–36.0)
MCV: 92.4 fL (ref 78.0–100.0)
PLATELETS: 226 10*3/uL (ref 150–400)
RBC: 4.48 MIL/uL (ref 3.87–5.11)
RDW: 14 % (ref 11.5–15.5)
WBC: 4.2 10*3/uL (ref 4.0–10.5)

## 2017-05-19 LAB — BASIC METABOLIC PANEL
Anion gap: 9 (ref 5–15)
BUN: 9 mg/dL (ref 6–20)
CALCIUM: 9.1 mg/dL (ref 8.9–10.3)
CO2: 26 mmol/L (ref 22–32)
CREATININE: 0.84 mg/dL (ref 0.44–1.00)
Chloride: 106 mmol/L (ref 101–111)
GFR calc Af Amer: >60 mL/min (ref >60)
GLUCOSE: 83 mg/dL (ref 65–99)
Potassium: 3.5 mmol/L (ref 3.5–5.1)
SODIUM: 141 mmol/L (ref 135–145)

## 2017-05-19 NOTE — Pre-Procedure Instructions (Signed)
Melissa Blackburn  05/19/2017      [place], [place] - [address] [facility] [address] [place] [address] Phone: [phone] Fax: [place], [place] [address] Phone: [phone] Fax: [phone]    Your procedure is scheduled on Thurs. Oct. 11  Report to [facility] Admitting at 1:15 A.M.  Call this number if you have problems the morning of surgery:  [phone]   Remember:  Do not eat food or drink liquids after midnight on Wed. Oct. 10  Take these medicines the morning of surgery with A SIP OF WATER : tylenol if needed, albuterol if needed-bring to hospital, robaxin if needed             7 days prior to surgery STOP taking any Aspirin (unless otherwise instructed by your surgeon), Aleve, Naproxen, Ibuprofen, Motrin, Advil, Goody's, BC's, all herbal medications, fish oil, and all vitamins   Do not wear jewelry, make-up or nail polish.  Do not wear lotions, powders, or perfumes, or deoderant.  Do not shave 48 hours prior to surgery.  Men may shave face and neck.  Do not bring valuables to the hospital.  [facility] is not responsible for any belongings or valuables.  Contacts, dentures or bridgework may not be worn into surgery.  Leave your suitcase in the car.  After surgery it may be brought to your room.  For patients admitted to the hospital, discharge time will be determined by your treatment team.  Patients discharged the day of surgery will not be allowed to drive home.    Special instructions:  - Preparing For Surgery  Before surgery, you can play an important role. Because skin is not sterile, your skin needs to be as free of germs as possible. You can reduce the number of germs on your skin by washing with CHG (chlorahexidine gluconate) Soap before surgery.  CHG is an antiseptic cleaner which kills germs and  bonds with the skin to continue killing germs even after washing.  Please do not use if you have an allergy to CHG or antibacterial soaps. If your skin becomes reddened/irritated stop using the CHG.  Do not shave (including legs and underarms) for at least 48 hours prior to first CHG shower. It is OK to shave your face.  Please follow these instructions carefully.   1. Shower the NIGHT BEFORE SURGERY and the MORNING OF SURGERY with CHG.   2. If you chose to wash your hair, wash your hair first as usual with your normal shampoo.  3. After you shampoo, rinse your hair and body thoroughly to remove the shampoo.  4. Use CHG as you would any other liquid soap. You can apply CHG directly to the skin and wash gently with a scrungie or a clean washcloth.   5. Apply the CHG Soap to your body ONLY FROM THE NECK DOWN.  Do not use on open wounds or open sores. Avoid contact with your eyes, ears, mouth and genitals (private parts). Wash genitals (private parts) with your normal soap.  USE REGULAR SHAMPOO AND CONDITIONER FOR HAIR USE REGULAR SOAP FOR FACE AND PRIVATE AREA  6. Wash thoroughly, paying special attention to the area where your surgery will be performed.  7. Thoroughly rinse your body with warm water from the neck down.  8. DO NOT shower/wash with your normal soap after  using and rinsing off the CHG Soap.  9. Pat yourself dry with a CLEAN TOWEL and [place] CLOTH  10. Wear CLEAN PAJAMAS to bed the night before surgery, wear comfortable clothes the morning of surgery  11. Place CLEAN SHEETS on your bed the night of your first shower and DO NOT SLEEP WITH PETS.    Day of Surgery: Do not apply any deodorants/lotions. Please wear clean clothes to the hospital/surgery center.       Please read over the following fact sheets that you were given. Coughing and Deep Breathing and Surgical Site Infection Prevention

## 2017-05-21 ENCOUNTER — Ambulatory Visit (HOSPITAL_COMMUNITY): Payer: 59 | Admitting: Anesthesiology

## 2017-05-21 ENCOUNTER — Encounter (HOSPITAL_COMMUNITY)
Admission: RE | Disposition: A | Discharge status: Home / Self Care | Payer: Self-pay | Source: Ambulatory Visit | Attending: Orthopedic Surgery

## 2017-05-21 ENCOUNTER — Observation Stay (HOSPITAL_COMMUNITY)
Admission: RE | Admit: 2017-05-21 | Discharge: 2017-05-22 | Disposition: A | Discharge status: Home / Self Care | Payer: 59 | Source: Ambulatory Visit | Attending: Orthopedic Surgery | Admitting: Orthopedic Surgery

## 2017-05-21 ENCOUNTER — Encounter (HOSPITAL_COMMUNITY): Payer: Self-pay | Admitting: *Deleted

## 2017-05-21 DIAGNOSIS — G4733 Obstructive sleep apnea (adult) (pediatric): Secondary | ICD-10-CM | POA: Diagnosis not present

## 2017-05-21 DIAGNOSIS — M75101 Unspecified rotator cuff tear or rupture of right shoulder, not specified as traumatic: Secondary | ICD-10-CM | POA: Diagnosis not present

## 2017-05-21 DIAGNOSIS — X500XXA Overexertion from strenuous movement or load, initial encounter: Secondary | ICD-10-CM | POA: Insufficient documentation

## 2017-05-21 DIAGNOSIS — I10 Essential (primary) hypertension: Secondary | ICD-10-CM | POA: Insufficient documentation

## 2017-05-21 DIAGNOSIS — Z9989 Dependence on other enabling machines and devices: Secondary | ICD-10-CM | POA: Diagnosis not present

## 2017-05-21 DIAGNOSIS — Z9889 Other specified postprocedural states: Secondary | ICD-10-CM

## 2017-05-21 DIAGNOSIS — G43909 Migraine, unspecified, not intractable, without status migrainosus: Secondary | ICD-10-CM | POA: Insufficient documentation

## 2017-05-21 DIAGNOSIS — S43401A Unspecified sprain of right shoulder joint, initial encounter: Secondary | ICD-10-CM | POA: Insufficient documentation

## 2017-05-21 DIAGNOSIS — M19011 Primary osteoarthritis, right shoulder: Secondary | ICD-10-CM | POA: Diagnosis not present

## 2017-05-21 DIAGNOSIS — G8918 Other acute postprocedural pain: Secondary | ICD-10-CM | POA: Diagnosis not present

## 2017-05-21 DIAGNOSIS — J45909 Unspecified asthma, uncomplicated: Secondary | ICD-10-CM | POA: Diagnosis not present

## 2017-05-21 DIAGNOSIS — Z79899 Other long term (current) drug therapy: Secondary | ICD-10-CM | POA: Diagnosis not present

## 2017-05-21 DIAGNOSIS — R0789 Other chest pain: Secondary | ICD-10-CM | POA: Diagnosis not present

## 2017-05-21 DIAGNOSIS — M75111 Incomplete rotator cuff tear or rupture of right shoulder, not specified as traumatic: Secondary | ICD-10-CM | POA: Diagnosis not present

## 2017-05-21 DIAGNOSIS — M7541 Impingement syndrome of right shoulder: Secondary | ICD-10-CM | POA: Diagnosis not present

## 2017-05-21 DIAGNOSIS — R079 Chest pain, unspecified: Secondary | ICD-10-CM | POA: Diagnosis not present

## 2017-05-21 HISTORY — PX: SHOULDER ARTHROSCOPY WITH ROTATOR CUFF REPAIR AND SUBACROMIAL DECOMPRESSION: SHX5686

## 2017-05-21 LAB — GLUCOSE, CAPILLARY
GLUCOSE-CAPILLARY: 77 mg/dL (ref 65–99)
GLUCOSE-CAPILLARY: 87 mg/dL (ref 65–99)

## 2017-05-21 LAB — TROPONIN I: Troponin I: <0.03 ng/mL (ref <0.03)

## 2017-05-21 SURGERY — SHOULDER ARTHROSCOPY WITH ROTATOR CUFF REPAIR AND SUBACROMIAL DECOMPRESSION
Anesthesia: General | Site: Shoulder | Laterality: Right

## 2017-05-21 MED ORDER — PHENOL 1.4 % MT LIQD: 1.0000 | OROMUCOSAL | Status: DC | PRN

## 2017-05-21 MED ORDER — CARISOPRODOL 350 MG PO TABS
350.0000 mg | ORAL_TABLET | Freq: Every day | ORAL | Status: DC
  Administered 2017-05-21: 350 mg via ORAL
  Filled 2017-05-21: qty 1

## 2017-05-21 MED ORDER — DEXAMETHASONE SODIUM PHOSPHATE 10 MG/ML IJ SOLN
INTRAMUSCULAR | Status: DC | PRN
  Administered 2017-05-21: 10 mg via INTRAVENOUS

## 2017-05-21 MED ORDER — SUGAMMADEX SODIUM 200 MG/2ML IV SOLN
INTRAVENOUS | Status: AC
  Filled 2017-05-21: qty 2

## 2017-05-21 MED ORDER — MIDAZOLAM HCL 2 MG/2ML IJ SOLN
INTRAMUSCULAR | Status: AC
  Filled 2017-05-21: qty 2

## 2017-05-21 MED ORDER — ROCURONIUM BROMIDE 100 MG/10ML IV SOLN
INTRAVENOUS | Status: DC | PRN
  Administered 2017-05-21: 50 mg via INTRAVENOUS
  Administered 2017-05-21: 10 mg via INTRAVENOUS

## 2017-05-21 MED ORDER — POVIDONE-IODINE 7.5 % EX SOLN: Freq: Once | CUTANEOUS | Status: DC

## 2017-05-21 MED ORDER — FAMOTIDINE IN NACL 20-0.9 MG/50ML-% IV SOLN
20.0000 mg | Freq: Once | INTRAVENOUS | Status: AC
  Administered 2017-05-21: 20 mg via INTRAVENOUS
  Filled 2017-05-21: qty 50

## 2017-05-21 MED ORDER — DIPHENHYDRAMINE HCL 12.5 MG/5ML PO ELIX: 12.5000 mg | ORAL_SOLUTION | ORAL | Status: DC | PRN

## 2017-05-21 MED ORDER — CEFAZOLIN SODIUM-DEXTROSE 2-4 GM/100ML-% IV SOLN
2.0000 g | INTRAVENOUS | Status: AC
  Administered 2017-05-21: 2 g via INTRAVENOUS

## 2017-05-21 MED ORDER — ACETAMINOPHEN 650 MG RE SUPP: 650.0000 mg | Freq: Four times a day (QID) | RECTAL | Status: DC | PRN

## 2017-05-21 MED ORDER — SODIUM CHLORIDE 0.9 % IV SOLN
INTRAVENOUS | Status: DC
  Administered 2017-05-21: 22:00:00 via INTRAVENOUS

## 2017-05-21 MED ORDER — OXYCODONE HCL 5 MG PO TABS
5.0000 mg | ORAL_TABLET | ORAL | Status: DC | PRN
  Administered 2017-05-22: 5 mg via ORAL
  Filled 2017-05-21: qty 1

## 2017-05-21 MED ORDER — EPHEDRINE SULFATE 50 MG/ML IJ SOLN
INTRAMUSCULAR | Status: DC | PRN
  Administered 2017-05-21 (×2): 10 mg via INTRAVENOUS

## 2017-05-21 MED ORDER — ONDANSETRON HCL 4 MG/2ML IJ SOLN: 4.0000 mg | Freq: Four times a day (QID) | INTRAMUSCULAR | Status: DC | PRN

## 2017-05-21 MED ORDER — PROMETHAZINE HCL 25 MG/ML IJ SOLN
INTRAMUSCULAR | Status: AC
  Filled 2017-05-21: qty 1

## 2017-05-21 MED ORDER — PHENYLEPHRINE 40 MCG/ML (10ML) SYRINGE FOR IV PUSH (FOR BLOOD PRESSURE SUPPORT)
PREFILLED_SYRINGE | INTRAVENOUS | Status: AC
  Filled 2017-05-21: qty 10

## 2017-05-21 MED ORDER — METOCLOPRAMIDE HCL 5 MG/ML IJ SOLN: 5.0000 mg | Freq: Three times a day (TID) | INTRAMUSCULAR | Status: DC | PRN

## 2017-05-21 MED ORDER — DEXAMETHASONE SODIUM PHOSPHATE 10 MG/ML IJ SOLN
INTRAMUSCULAR | Status: AC
  Filled 2017-05-21: qty 1

## 2017-05-21 MED ORDER — DEXTROSE 50 % IV SOLN
INTRAVENOUS | Status: AC
  Administered 2017-05-21: 12.5 mL
  Filled 2017-05-21: qty 50

## 2017-05-21 MED ORDER — ONDANSETRON HCL 4 MG PO TABS: 4.0000 mg | ORAL_TABLET | Freq: Four times a day (QID) | ORAL | Status: DC | PRN

## 2017-05-21 MED ORDER — METOCLOPRAMIDE HCL 5 MG PO TABS: 5.0000 mg | ORAL_TABLET | Freq: Three times a day (TID) | ORAL | Status: DC | PRN

## 2017-05-21 MED ORDER — PHENYLEPHRINE HCL 10 MG/ML IJ SOLN
INTRAMUSCULAR | Status: DC | PRN
  Administered 2017-05-21: 40 ug via INTRAVENOUS

## 2017-05-21 MED ORDER — AMITRIPTYLINE HCL 50 MG PO TABS
50.0000 mg | ORAL_TABLET | Freq: Every day | ORAL | Status: DC
  Administered 2017-05-21: 50 mg via ORAL
  Filled 2017-05-21: qty 1

## 2017-05-21 MED ORDER — FENTANYL CITRATE (PF) 250 MCG/5ML IJ SOLN
INTRAMUSCULAR | Status: AC
  Filled 2017-05-21: qty 5

## 2017-05-21 MED ORDER — ONDANSETRON HCL 4 MG/2ML IJ SOLN
INTRAMUSCULAR | Status: DC | PRN
  Administered 2017-05-21: 4 mg via INTRAVENOUS

## 2017-05-21 MED ORDER — ONDANSETRON HCL 4 MG/2ML IJ SOLN
INTRAMUSCULAR | Status: AC
  Filled 2017-05-21: qty 2

## 2017-05-21 MED ORDER — OXYCODONE-ACETAMINOPHEN 5-325 MG PO TABS: 1.0000 | ORAL_TABLET | ORAL | 0 refills | Status: DC | PRN

## 2017-05-21 MED ORDER — PROPOFOL 10 MG/ML IV BOLUS
INTRAVENOUS | Status: DC | PRN
  Administered 2017-05-21: 150 mg via INTRAVENOUS

## 2017-05-21 MED ORDER — NITROGLYCERIN 2 % TD OINT
0.5000 [in_us] | TOPICAL_OINTMENT | Freq: Three times a day (TID) | TRANSDERMAL | Status: DC
  Administered 2017-05-21: 0.5 [in_us] via TOPICAL
  Filled 2017-05-21: qty 30

## 2017-05-21 MED ORDER — ALUMINUM HYDROXIDE GEL 320 MG/5ML PO SUSP
15.0000 mL | ORAL | Status: DC | PRN
  Filled 2017-05-21: qty 30

## 2017-05-21 MED ORDER — ATENOLOL 25 MG PO TABS
25.0000 mg | ORAL_TABLET | Freq: Every day | ORAL | Status: DC
  Administered 2017-05-21: 25 mg via ORAL
  Filled 2017-05-21: qty 1

## 2017-05-21 MED ORDER — PROPOFOL 10 MG/ML IV BOLUS
INTRAVENOUS | Status: AC
  Filled 2017-05-21: qty 20

## 2017-05-21 MED ORDER — ACETAMINOPHEN 325 MG PO TABS: 650.0000 mg | ORAL_TABLET | Freq: Four times a day (QID) | ORAL | Status: DC | PRN

## 2017-05-21 MED ORDER — SUGAMMADEX SODIUM 200 MG/2ML IV SOLN
INTRAVENOUS | Status: DC | PRN
  Administered 2017-05-21: 100 mg via INTRAVENOUS
  Administered 2017-05-21: 200 mg via INTRAVENOUS

## 2017-05-21 MED ORDER — LIDOCAINE HCL (CARDIAC) 20 MG/ML IV SOLN
INTRAVENOUS | Status: DC | PRN
  Administered 2017-05-21: 100 mg via INTRATRACHEAL

## 2017-05-21 MED ORDER — DOCUSATE SODIUM 100 MG PO CAPS: 100.0000 mg | ORAL_CAPSULE | Freq: Three times a day (TID) | ORAL | 0 refills | Status: DC | PRN

## 2017-05-21 MED ORDER — PROMETHAZINE HCL 25 MG/ML IJ SOLN
6.2500 mg | INTRAMUSCULAR | Status: AC | PRN
  Administered 2017-05-21 (×2): 6.25 mg via INTRAVENOUS

## 2017-05-21 MED ORDER — ALBUTEROL SULFATE (2.5 MG/3ML) 0.083% IN NEBU: 3.0000 mL | INHALATION_SOLUTION | Freq: Four times a day (QID) | RESPIRATORY_TRACT | Status: DC | PRN

## 2017-05-21 MED ORDER — LACTATED RINGERS IV SOLN
INTRAVENOUS | Status: DC
  Administered 2017-05-21 (×2): via INTRAVENOUS

## 2017-05-21 MED ORDER — NITROGLYCERIN IN D5W 200-5 MCG/ML-% IV SOLN
5.0000 ug/min | INTRAVENOUS | Status: DC
  Filled 2017-05-21: qty 250

## 2017-05-21 MED ORDER — METHOCARBAMOL 500 MG PO TABS: 500.0000 mg | ORAL_TABLET | Freq: Three times a day (TID) | ORAL | Status: DC | PRN

## 2017-05-21 MED ORDER — SODIUM CHLORIDE 0.9 % IR SOLN
Status: DC | PRN
  Administered 2017-05-21: 6000 mL

## 2017-05-21 MED ORDER — FENTANYL CITRATE (PF) 250 MCG/5ML IJ SOLN
INTRAMUSCULAR | Status: DC | PRN
  Administered 2017-05-21: 100 ug via INTRAVENOUS

## 2017-05-21 MED ORDER — SCOPOLAMINE 1 MG/3DAYS TD PT72
MEDICATED_PATCH | TRANSDERMAL | Status: AC
  Administered 2017-05-21: 1.5 mg
  Filled 2017-05-21: qty 1

## 2017-05-21 MED ORDER — MORPHINE SULFATE (PF) 4 MG/ML IV SOLN: 1.0000 mg | INTRAVENOUS | Status: DC | PRN

## 2017-05-21 MED ORDER — ASPIRIN EC 325 MG PO TBEC
325.0000 mg | DELAYED_RELEASE_TABLET | Freq: Every day | ORAL | Status: DC
  Administered 2017-05-21 – 2017-05-22 (×2): 325 mg via ORAL
  Filled 2017-05-21 (×2): qty 1

## 2017-05-21 MED ORDER — MENTHOL 3 MG MT LOZG: 1.0000 | LOZENGE | OROMUCOSAL | Status: DC | PRN

## 2017-05-21 MED ORDER — ROPIVACAINE HCL 7.5 MG/ML IJ SOLN
INTRAMUSCULAR | Status: DC | PRN
  Administered 2017-05-21: 20 mL via PERINEURAL

## 2017-05-21 MED ORDER — FENTANYL CITRATE (PF) 100 MCG/2ML IJ SOLN
INTRAMUSCULAR | Status: AC
  Administered 2017-05-21: 50 ug
  Filled 2017-05-21: qty 2

## 2017-05-21 MED ORDER — ROCURONIUM BROMIDE 10 MG/ML (PF) SYRINGE
PREFILLED_SYRINGE | INTRAVENOUS | Status: AC
  Filled 2017-05-21: qty 5

## 2017-05-21 MED ORDER — CEFAZOLIN SODIUM-DEXTROSE 2-4 GM/100ML-% IV SOLN
INTRAVENOUS | Status: AC
  Filled 2017-05-21: qty 100

## 2017-05-21 SURGICAL SUPPLY — 69 items
ANCHOR PEEK 4.75X19.1 SWLK C (Anchor) ×3 IMPLANT
ANCHOR SUTURETAK 3X12.7 PEEK (SUTURE) ×6 IMPLANT
BLADE SURG 11 STRL SS (BLADE) ×3 IMPLANT
BUR OVAL 4.0 (BURR) IMPLANT
BURR OVAL 8 FLU 4.0MM X 13CM (MISCELLANEOUS) ×1
BURR OVAL 8 FLU 4.0X13 (MISCELLANEOUS) ×2 IMPLANT
CANNULA 5.75X71 LONG (CANNULA) ×3 IMPLANT
CANNULA TWIST IN 8.25X7CM (CANNULA) ×3 IMPLANT
CHLORAPREP W/TINT 26ML (MISCELLANEOUS) ×3 IMPLANT
CUTTER BONE 4.0MM X 13CM (MISCELLANEOUS) ×3 IMPLANT
DRAPE INCISE IOBAN 66X45 STRL (DRAPES) IMPLANT
DRAPE ORTHO SPLIT 77X108 STRL (DRAPES) ×4
DRAPE STERI 35X30 U-POUCH (DRAPES) IMPLANT
DRAPE SURG 17X23 STRL (DRAPES) ×3 IMPLANT
DRAPE SURG ORHT 6 SPLT 77X108 (DRAPES) ×2 IMPLANT
DRAPE U-SHAPE 47X51 STRL (DRAPES) ×3 IMPLANT
DRAPE U-SHAPE 76X120 STRL (DRAPES) ×3 IMPLANT
DRSG PAD ABDOMINAL 8X10 ST (GAUZE/BANDAGES/DRESSINGS) IMPLANT
GAUZE SPONGE 4X4 12PLY STRL (GAUZE/BANDAGES/DRESSINGS) IMPLANT
GAUZE SPONGE 4X4 12PLY STRL LF (GAUZE/BANDAGES/DRESSINGS) ×3 IMPLANT
GAUZE XEROFORM 1X8 LF (GAUZE/BANDAGES/DRESSINGS) ×3 IMPLANT
GLOVE BIO SURGEON STRL SZ7 (GLOVE) ×6 IMPLANT
GLOVE BIO SURGEON STRL SZ7.5 (GLOVE) ×3 IMPLANT
GLOVE BIOGEL PI IND STRL 7.0 (GLOVE) ×2 IMPLANT
GLOVE BIOGEL PI IND STRL 8 (GLOVE) ×1 IMPLANT
GLOVE BIOGEL PI INDICATOR 7.0 (GLOVE) ×4
GLOVE BIOGEL PI INDICATOR 8 (GLOVE) ×2
GOWN STRL REUS W/ TWL LRG LVL3 (GOWN DISPOSABLE) ×3 IMPLANT
GOWN STRL REUS W/ TWL XL LVL3 (GOWN DISPOSABLE) ×1 IMPLANT
GOWN STRL REUS W/TWL LRG LVL3 (GOWN DISPOSABLE) ×6
GOWN STRL REUS W/TWL XL LVL3 (GOWN DISPOSABLE) ×2
KIT BASIN OR (CUSTOM PROCEDURE TRAY) ×3 IMPLANT
KIT PERC INSERT 3.0 KNTLS (KITS) ×3 IMPLANT
KIT ROOM TURNOVER OR (KITS) ×3 IMPLANT
MANIFOLD NEPTUNE II (INSTRUMENTS) ×3 IMPLANT
NDL SUT 6 .5 CRC .975X.05 MAYO (NEEDLE) IMPLANT
NEEDLE HYPO 25GX1X1/2 BEV (NEEDLE) IMPLANT
NEEDLE MAYO TAPER (NEEDLE)
NEEDLE SCORPION MULTI FIRE (NEEDLE) IMPLANT
NEEDLE SPNL 18GX3.5 QUINCKE PK (NEEDLE) ×3 IMPLANT
PACK SHOULDER (CUSTOM PROCEDURE TRAY) ×3 IMPLANT
PAD ABD 8X10 STRL (GAUZE/BANDAGES/DRESSINGS) ×6 IMPLANT
PAD ARMBOARD 7.5X6 YLW CONV (MISCELLANEOUS) ×6 IMPLANT
PEEK Suture Tak Knotless 3 x 12.7 ×2 IMPLANT
PEEK Suture Tak Knotless 3 x 12.7 (Orthopedic Implant) ×3 IMPLANT
PROBE BIPOLAR ATHRO 135MM 90D (MISCELLANEOUS) ×3 IMPLANT
RESECTOR FULL RADIUS 4.2MM (BLADE) ×3 IMPLANT
SLING ARM FOAM STRAP LRG (SOFTGOODS) ×3 IMPLANT
SLING ARM FOAM STRAP MED (SOFTGOODS) IMPLANT
SLING ARM IMMOBILIZER LRG (SOFTGOODS) ×3 IMPLANT
SPONGE LAP 4X18 X RAY DECT (DISPOSABLE) IMPLANT
SUPPORT WRAP ARM LG (MISCELLANEOUS) ×3 IMPLANT
SUT 2 FIBERLOOP 20 STRT BLUE (SUTURE)
SUT ETHILON 3 0 PS 1 (SUTURE) ×3 IMPLANT
SUT TIGER TAPE 7 IN WHITE (SUTURE) IMPLANT
SUTURE 2 FIBERLOOP 20 STRT BLU (SUTURE) IMPLANT
SUTURE TAPE 1.3 40 TPR END (SUTURE) IMPLANT
SUTURETAPE 1.3 40 TPR END (SUTURE)
SYR CONTROL 10ML LL (SYRINGE) IMPLANT
TAPE CLOTH SURG 6X10 WHT LF (GAUZE/BANDAGES/DRESSINGS) ×3 IMPLANT
TAPE FIBER 2MM 7IN #2 BLUE (SUTURE) IMPLANT
TOWEL OR 17X24 6PK STRL BLUE (TOWEL DISPOSABLE) IMPLANT
TOWEL OR 17X26 10 PK STRL BLUE (TOWEL DISPOSABLE) IMPLANT
TOWEL OR NON WOVEN STRL DISP B (DISPOSABLE) IMPLANT
TUBE CONNECTING 12'X1/4 (SUCTIONS) ×1
TUBE CONNECTING 12X1/4 (SUCTIONS) ×2 IMPLANT
TUBING ARTHROSCOPY IRRIG 16FT (MISCELLANEOUS) ×3 IMPLANT
WAND HAND CNTRL MULTIVAC 90 (MISCELLANEOUS) IMPLANT
WATER STERILE IRR 1000ML POUR (IV SOLUTION) ×3 IMPLANT

## 2017-05-21 NOTE — Consult Note (Addendum)
Cardiology Consult    Patient ID: Melissa Blackburn MRN: [id number], DOB/AGE: October 22, 1955   Admit date: 05/21/2017 Date of Consult: 05/21/2017  Primary Physician: Celene Squibb, MD Primary Cardiologist: New Requesting Provider: Tamera Punt Reason for Consultation: Chest pain  Melissa Blackburn is a 61 y.o. female who is being seen today for the evaluation of chest pain at the request of Dr. Tamera Punt.   Patient Profile    61 yo female with PMH of  HTN, anemia, migraine, OSA on Cpap who presented for shoulder surgery and developed chest tightness post op.   Past Medical History   Past Medical History:  Diagnosis Date  . Anemia    hx of  . Asthma    "adult onset" (09/11/2016)  . DDD (degenerative disc disease)    "hands, knees" (09/11/2016)  . Hypertension   . Migraine    "none since stimulator placed 07/2016" (09/11/2016)  . Needle stick injury with contaminated needle    patient undergoing testing  . Numbness and tingling 11/15/2012  . OSA on CPAP    cpap 7 (09/11/2016)  . Pain in limb 11/15/2012  . Paresthesias 11/15/2012  . Pneumonia 1980s X 1  . PONV (postoperative nausea and vomiting)    "severe nausea and vomiting"  . Weakness generalized 11/15/2012    Past Surgical History:  Procedure Laterality Date  . BACK SURGERY    . ESOPHAGOGASTRODUODENOSCOPY (EGD) WITH PROPOFOL N/A 01/10/2016   Procedure: ESOPHAGOGASTRODUODENOSCOPY (EGD) WITH PROPOFOL;  Surgeon: Milus Banister, MD;  Location: [facility];  Service: Endoscopy;  Laterality: N/A;  . KNEE ARTHROSCOPY  04/23/2012   Procedure: ARTHROSCOPY KNEE;  Surgeon: Augustin Schooling, MD;  Location: [facility];  Service: Orthopedics;  Laterality: Left;  lateral meniscectomy  . KNEE ARTHROSCOPY Bilateral    "bone fragments"  . KNEE ARTHROSCOPY W/ ACL RECONSTRUCTION Left 2009  . KNEE ARTHROSCOPY W/ MENISCAL REPAIR Left 1981  . KNEE ARTHROSCOPY W/ MENISCAL REPAIR Left 2012  . LUMBAR FUSION Left 09/11/2016   LUMBAR 4-5 TRANSFORAMINAL LUMBAR INTERBODY  FUSION WITH INSTRUMENTATION AND Gertha Calkin Archie Endo 09/11/2016  . PERIPHERAL NERVE STIMULATOR Right 07/2016   "in my chest; to treat migraine headaches"  . RECTOCELE REPAIR    . SACROILIAC JOINT FUSION Left 04/09/2017   Procedure: LEFT SIDED SACROILIAC JOINT FUSION;  Surgeon: Phylliss Bob, MD;  Location: [facility];  Service: Orthopedics;  Laterality: Left;  . SHOULDER ARTHROSCOPY W/ SUPERIOR LABRAL ANTERIOR POSTERIOR REPAIR Bilateral    "I have rivets in both shoulders to hold them together" (09/11/2016)  . SHOULDER ARTHROSCOPY WITH SUBACROMIAL DECOMPRESSION Right 1994 X 2; 1995; ~ 2010  . UPPER ESOPHAGEAL ENDOSCOPIC ULTRASOUND (EUS)  01/10/2016   Procedure: UPPER ESOPHAGEAL ENDOSCOPIC ULTRASOUND (EUS);  Surgeon: Milus Banister, MD;  Location: [facility];  Service: Endoscopy;;  . VAGINAL HYSTERECTOMY  1986   "partial"     Allergies  Allergies  Allergen Reactions  . Verapamil Other (See Comments)    Heart block per patient  . Avelox [Moxifloxacin Hcl In Nacl] Diarrhea  . Flexeril [Cyclobenzaprine Hcl] Other (See Comments)    Systolic Levels "bottoms out"  . Imitrex [Sumatriptan] Other (See Comments)    "Messes with my blood pressure - will cause it to go up and then down"   . Midazolam Hcl Nausea And Vomiting  . Neurontin [Gabapentin] Other (See Comments)    Muscle spasms  . Pregabalin Other (See Comments)    Muscle spasms  . Tape Other (See Comments)    Blisters  skin    History of Present Illness    Melissa Blackburn is a 61 yo female with PMH of PMH of  HTN, anemia, migraine, OSA on Cpap. Reports she has never seen a cardiologist in the past. Presented today for outpatient rotator cuff surgery. Post op developed chest tightness with some nausea but felt the nausea was 2/2 to anesthesia. Reports she is fairly active, and does not normally have chest pain on exertion. Does have family hx of CAD with father having an MI in his 36s.   In the PACU her EKG was nonacute. Chest tightness improved by  the time of exam. Troponin drawn by primary, but pending at the time of interview. Blood pressure and heart stable. Will plan admission overnight and cycle troponins.   Inpatient Medications    . midazolam      . povidone-iodine   Topical Once  . promethazine        Family History    Family History  Problem Relation Age of Onset  . Cancer Mother        Breast cancer  . Hypertension Father   . COPD Father   . Coronary artery disease Father   . Cancer Maternal Grandmother        Bone cancer  . Coronary artery disease Maternal Grandfather     Social History    Social History   Social History  . Marital status: Married    Spouse name: N/A  . Number of children: 2  . Years of education: N/A   Occupational History  . Nurse [employer]   Social History Main Topics  . Smoking status: Never Smoker  . Smokeless tobacco: Never Used  . Alcohol use No  . Drug use: No  . Sexual activity: Yes   Other Topics Concern  . Not on file   Social History Narrative   ** Merged History Encounter **         Review of Systems    See HPI All other systems reviewed and are otherwise negative except as noted above.  Physical Exam    Blood pressure 130/80, pulse 84, temperature 97.6 F (36.4 C), resp. rate 16, height 5\' 11"  (1.803 m), weight 190 lb (86.2 kg), SpO2 97 %.  General: Pleasant, older WF NAD Psych: Normal affect. Neuro: Alert and oriented X 3. Right arm in sling. HEENT: Normal  Neck: Supple without bruits or JVD. Lungs:  Resp regular and unlabored, CTA. Heart: RRR no s3, s4, or murmurs. Abdomen: Soft, non-tender, non-distended, BS + x 4.  Extremities: No clubbing, cyanosis or edema. DP/PT/Radials 2+ and equal bilaterally.  Labs    Troponin (Point of Care Test) No results for input(s): TROPIPOC in the last 72 hours. No results for input(s): CKTOTAL, CKMB, TROPONINI in the last 72 hours. Lab Results  Component Value Date   WBC 4.2 05/19/2017   HGB 13.3  05/19/2017   HCT 41.4 05/19/2017   MCV 92.4 05/19/2017   PLT 226 05/19/2017    Recent Labs Lab 05/19/17 1330  NA 141  K 3.5  CL 106  CO2 26  BUN 9  CREATININE 0.84  CALCIUM 9.1  GLUCOSE 83   No results found for: CHOL, HDL, LDLCALC, TRIG Lab Results  Component Value Date   DDIMER 1.52 (H) 10/02/2016     Radiology Studies    No results found.  ECG & Cardiac Imaging    EKG: SR   Assessment & Plan    61  yo female with PMH of  HTN, anemia, migraine, OSA on Cpap who presented for shoulder surgery and developed chest tightness post op.   1. Chest tightness: Still with mild chest tightness. Suggest admission overnight for observation.  -- cycle troponins -- low dose IV nitro gtt, no heparin given she is post op -- IV pepcid x1 now  -- recheck EKG in the am  2. Rotator cuff tear s/p repair: management per primary  3. HTN: Stable  Signed, Reino Bellis, NP-C Pager [phone] 05/21/2017, 7:40 PM   Asked to see patient in PACU for evaluation of chest tightness.  The patient has no prior cardiac history but awoke from anesthesia and felt some tightness in her chest that she never had before.  She has a history of waking up poorly with anesthesia in complaining of nausea and doesn't tolerate anesthesia well according to her.cause the chest pain was something new that she had never experienced before and EKG was done that showed some very minimal nonspecific changes.  By the time we had seen her the discomfort had largely resolved. She does not have anginal type pain and doesn't have a whole lot of risk factors.  Her examination was benign.  Lungs were clear, cardiac exam showed normal S1 and S2 there was no S3 or peripheral pulses were 2+ and her abdomen was unremarkable.  Her EKG showed moderate nonspecific ST and T-wave changes. I agree with the assessment above and the plan as mentioned above.  We will give her some IV Pepcid and check another EKG on her in the  morning.  If her troponins are negative and she is stable in the morning could probably go home.  Kerry Hough MD [facility] 8:35 PM

## 2017-05-21 NOTE — Anesthesia Procedure Notes (Signed)
Procedure Name: Intubation Date/Time: 05/21/2017 4:07 PM Performed by: Mariea Clonts Pre-anesthesia Checklist: Patient identified, Emergency Drugs available, Suction available and Patient being monitored Patient Re-evaluated:Patient Re-evaluated prior to induction Oxygen Delivery Method: Circle System Utilized Preoxygenation: Pre-oxygenation with 100% oxygen Induction Type: IV induction and Cricoid Pressure applied Ventilation: Mask ventilation without difficulty and Oral airway inserted - appropriate to patient size Laryngoscope Size: Sabra Heck and 2 Grade View: Grade I Tube type: Oral Tube size: 7.0 mm Number of attempts: 1 Airway Equipment and Method: Stylet and Oral airway Placement Confirmation: ETT inserted through vocal cords under direct vision,  positive ETCO2 and breath sounds checked- equal and bilateral Tube secured with: Tape Dental Injury: Teeth and Oropharynx as per pre-operative assessment

## 2017-05-21 NOTE — H&P (Signed)
Melissa Blackburn is an 61 y.o. female.   Chief Complaint: R shoulder pain and weakness    HPI: R shoulder pain and weakness since lifting large bucket over 3 months ago.  Failed conservative management.  Ultrasound concerning for partial rotator cuff tear.  Past Medical History:  Diagnosis Date  . Anemia    hx of  . Asthma    "adult onset" (09/11/2016)  . DDD (degenerative disc disease)    "hands, knees" (09/11/2016)  . Hypertension   . Migraine    "none since stimulator placed 07/2016" (09/11/2016)  . Needle stick injury with contaminated needle    patient undergoing testing  . Numbness and tingling 11/15/2012  . OSA on CPAP    cpap 7 (09/11/2016)  . Pain in limb 11/15/2012  . Paresthesias 11/15/2012  . Pneumonia 1980s X 1  . PONV (postoperative nausea and vomiting)    "severe nausea and vomiting"  . Weakness generalized 11/15/2012    Past Surgical History:  Procedure Laterality Date  . BACK SURGERY    . ESOPHAGOGASTRODUODENOSCOPY (EGD) WITH PROPOFOL N/A 01/10/2016   Procedure: ESOPHAGOGASTRODUODENOSCOPY (EGD) WITH PROPOFOL;  Surgeon: Milus Banister, MD;  Location: [facility];  Service: Endoscopy;  Laterality: N/A;  . KNEE ARTHROSCOPY  04/23/2012   Procedure: ARTHROSCOPY KNEE;  Surgeon: Augustin Schooling, MD;  Location: [place];  Service: Orthopedics;  Laterality: Left;  lateral meniscectomy  . KNEE ARTHROSCOPY Bilateral    "bone fragments"  . KNEE ARTHROSCOPY W/ ACL RECONSTRUCTION Left 2009  . KNEE ARTHROSCOPY W/ MENISCAL REPAIR Left 1981  . KNEE ARTHROSCOPY W/ MENISCAL REPAIR Left 2012  . LUMBAR FUSION Left 09/11/2016   LUMBAR 4-5 TRANSFORAMINAL LUMBAR INTERBODY FUSION WITH INSTRUMENTATION AND Gertha Calkin Archie Endo 09/11/2016  . PERIPHERAL NERVE STIMULATOR Right 07/2016   "in my chest; to treat migraine headaches"  . RECTOCELE REPAIR    . SACROILIAC JOINT FUSION Left 04/09/2017   Procedure: LEFT SIDED SACROILIAC JOINT FUSION;  Surgeon: Phylliss Bob, MD;  Location: [facility];  Service: Orthopedics;   Laterality: Left;  . SHOULDER ARTHROSCOPY W/ SUPERIOR LABRAL ANTERIOR POSTERIOR REPAIR Bilateral    "I have rivets in both shoulders to hold them together" (09/11/2016)  . SHOULDER ARTHROSCOPY WITH SUBACROMIAL DECOMPRESSION Right 1994 X 2; 1995; ~ 2010  . UPPER ESOPHAGEAL ENDOSCOPIC ULTRASOUND (EUS)  01/10/2016   Procedure: UPPER ESOPHAGEAL ENDOSCOPIC ULTRASOUND (EUS);  Surgeon: Milus Banister, MD;  Location: [facility];  Service: Endoscopy;;  . VAGINAL HYSTERECTOMY  1986   "partial"    Family History  Problem Relation Age of Onset  . Cancer Mother        Breast cancer  . Hypertension Father   . COPD Father   . Coronary artery disease Father   . Cancer Maternal Grandmother        Bone cancer  . Coronary artery disease Maternal Grandfather    Social History:  reports that she has never smoked. She has never used smokeless tobacco. She reports that she does not drink alcohol or use drugs.  Allergies:  Allergies  Allergen Reactions  . Verapamil Other (See Comments)    Heart block per patient  . Avelox [Moxifloxacin Hcl In Nacl] Diarrhea  . Flexeril [Cyclobenzaprine Hcl] Other (See Comments)    Systolic Levels "bottoms out"  . Imitrex [Sumatriptan] Other (See Comments)    "Messes with my blood pressure - will cause it to go up and then down"   . Midazolam Hcl Nausea And Vomiting  . Neurontin [Gabapentin]  Other (See Comments)    Muscle spasms  . Pregabalin Other (See Comments)    Muscle spasms  . Tape Other (See Comments)    Blisters skin    Medications Prior to Admission  Medication Sig Dispense Refill  . acetaminophen (TYLENOL) 500 MG tablet Take 1,000 mg by mouth every 6 (six) hours as needed for mild pain or moderate pain.     Marland Kitchen amitriptyline (ELAVIL) 50 MG tablet Take 50 mg by mouth at bedtime. For migraines.    Marland Kitchen atenolol (TENORMIN) 25 MG tablet Take 25 mg by mouth at bedtime.   5  . carisoprodol (SOMA) 350 MG tablet Take 350 mg by mouth at bedtime.  0  .  methocarbamol (ROBAXIN) 500 MG tablet Take 500 mg by mouth 3 (three) times daily as needed for muscle spasms.    Marland Kitchen albuterol (PROAIR HFA) 108 (90 Base) MCG/ACT inhaler Inhale 2 puffs into the lungs every 6 (six) hours as needed for wheezing or shortness of breath.       Results for orders placed or performed during the hospital encounter of 05/21/17 (from the past 48 hour(s))  Glucose, capillary     Status: None   Collection Time: 05/21/17  1:28 PM  Result Value Ref Range   Glucose-Capillary 77 65 - 99 mg/dL   Comment 1 Notify RN    Comment 2 Document in Chart    No results found.  Review of Systems  All other systems reviewed and are negative.   Blood pressure 133/73, pulse 76, temperature 98.6 F (37 C), temperature source Oral, resp. rate 20, height 5\' 11"  (1.803 m), weight 86.2 kg (190 lb), SpO2 100 %. Physical Exam  Constitutional: She is oriented to person, place, and time. She appears well-developed and well-nourished.  HENT:  Head: Atraumatic.  Eyes: EOM are normal.  Cardiovascular: Intact distal pulses.   Respiratory: Effort normal.  Musculoskeletal:  R shoulder pain and weakness with RC testing.  Neurological: She is alert and oriented to person, place, and time.  Skin: Skin is warm and dry.  Psychiatric: She has a normal mood and affect.     Assessment/Plan R shoulder RCT, AC DJD Plan R arth debride vs repair RCR, SAD, DCR Risks / benefits of surgery discussed Consent on chart  NPO for OR Preop antibiotics   Nita Sells, MD 05/21/2017, 3:39 PM

## 2017-05-21 NOTE — Op Note (Signed)
Procedure(s): RIGHT SHOULDER ARTHROSCOPY WITH ROTATOR CUFF REPAIR VS DEBRIDEMENT, SUBACROMIAL DECOMPRESSION, DISTAL CLAVICAL EXCISION Procedure Note  Melissa Blackburn female 61 y.o. 05/21/2017  Procedure(s) and Anesthesia Type:   1. RIGHT SHOULDER ARTHROSCOPY WITH ROTATOR CUFF REPAIR   2. SUBACROMIAL DECOMPRESSION,    3. DISTAL CLAVICAL EXCISION -   Surgeon(s) and Role:    Tania Ade, MD - Primary     Surgeon: Nita Sells   Assistants: Jeanmarie Hubert PA-C (Danielle was present and scrubbed throughout the procedure and was essential in positioning, assisting with the camera and instrumentation,, and closure)  Anesthesia: General endotracheal anesthesia with preoperative interscalene block given by the attending anesthologist    Procedure Detail  RIGHT SHOULDER ARTHROSCOPY WITH ROTATOR CUFF REPAIR VS DEBRIDEMENT, SUBACROMIAL DECOMPRESSION, DISTAL CLAVICAL EXCISION  Estimated Blood Loss: Min         Drains: none  Blood Given: none         Specimens: none        Complications:  * No complications entered in OR log *         Disposition: PACU - hemodynamically stable.         Condition: stable    Procedure:   INDICATIONS FOR SURGERY: The patient is 61 y.o. female who has more than 3 month history of right shoulder pain after an injy carrying heavy bucket. She has a history of rotator cuff repair. She failed to respond to conservative measures and was indicated for diagnostic arthroscopy with debridement versus repair of the rotator cuff, subacromial decompression and distal clavicle excision  OPERATIVE FINDINGS: Examination under anesthesia: no stiffness or instability  DESCRIPTION OF PROCEDURE: The patient was identified in preoperative  holding area where I personally marked the operative site after  verifying site, side, and procedure with the patient. An interscalene block was given by the attending anesthesiologist the holding area.  The  patient was taken back to the operating room where general anesthesia was induced without complication and was placed in the beach-chair position with the back  elevated about 60 degrees and all extremities and head and neck carefully padded and  positioned.   The right upper extremity was then prepped and  draped in a standard sterile fashion. The appropriate time-out  procedure was carried out. The patient did receive IV antibiotics  within 30 minutes of incision.   A small posterior portal incision was made and the arthroscope was introduced into the joint. An anterior portal was then established above the subscapularis using needle localization. Small cannula was placed anteriorly. Diagnostic arthroscopy was then carried out.  Her subscapularis is noted to be completely intact. Biceps tendon is intact with mild erythema. The superior labrum has mild fraying which was debrided. Glenohumeral jont surfaces largely intact with a small chondral defect on the anterior superior humeral head. Supraspinatus was noted to have a delaminated articular surface partial tear with about 1.5 centimeters of retraction.There was no -thickness perforation. The underlying tendon was debrided back to healthy bleeding tendonand the decision was made to proceed with a PASTA repair for the delaminated portion.  The arthroscope was then introduced into the subacromial space a standard lateral portal was established with needle localization. The shaver was used through the lateral portal to perform a bursectomy.  From the bursal side the rotator cuff was intact with no full perforation.  The arthroscope was then placed back in the subacromial space where the percutaneous kit was used to place 2 3 mm knotless suture  tack anchors through the  delaminated tendinous layer and into the medial tuberosity. She was noted to have 2 previous anchors in this area which were avoided.  He arthroscope was then placed back iin the  subacromial space where the sutures were passed from one anchor to the other compressing the medial portion of the tendon down against the tuberosity. These 2 suture strands were then brought out laterally into a 4.75 swivel lock anchor to complete the repair.  The coracoacromial ligament was taken down off the anterior acromion with the ArthroCare exposing a downsloping sharp anterior acromial spur. A high-speed bur was then used through the lateral portal to take down the anterior acromial spur from lateral to medial in a standard acromioplasty.  The acromioplasty was also viewed from the lateral portal and the bur was used as necessary to ensure that the acromion was completely flat from posterior to anterior.  The distal clavicle was exposed arthroscopically and the bur was used to take off the undersurface for approximately 8 mm from the lateral portal. The bur was then moved to an anterior portal position to complete the distal clavicle excision resecting about 8 mm of the distal clavicle and a smooth even fashion. This was viewed from anterior and lateral portals and felt to be complete.  The arthroscopic equipment was removed from the joint and the portals were closed with 3-0 nylon in an interrupted fashion. Sterile dressings were then applied including Xeroform 4 x 4's ABDs and tape. The patient was then allowed to awaken from general anesthesia, placed in a sling, transferred to the stretcher and taken to the recovery room in stable condition.   POSTOPERATIVE PLAN: The patient will be discharged home today and will followup in one week for suture removal and wound check.

## 2017-05-21 NOTE — Discharge Instructions (Signed)

## 2017-05-21 NOTE — Anesthesia Preprocedure Evaluation (Signed)
Anesthesia Evaluation  Patient identified by MRN, date of birth, ID band Patient awake    Reviewed: Allergy & Precautions, NPO status , Patient's Chart, lab work & pertinent test results, reviewed documented beta blocker date and time   History of Anesthesia Complications (+) PONV and history of anesthetic complications  Airway Mallampati: I  TM Distance: <3 FB Neck ROM: Full    Dental  (+) Teeth Intact, Dental Advisory Given, Missing   Pulmonary asthma , sleep apnea and Continuous Positive Airway Pressure Ventilation ,    Pulmonary exam normal breath sounds clear to auscultation       Cardiovascular hypertension, Pt. on home beta blockers (-) angina(-) CAD and (-) Past MI Normal cardiovascular exam Rhythm:Regular Rate:Normal     Neuro/Psych  Headaches,  Neuromuscular disease    GI/Hepatic negative GI ROS, Neg liver ROS,   Endo/Other  negative endocrine ROS  Renal/GU negative Renal ROS     Musculoskeletal  (+) Arthritis , Osteoarthritis,    Abdominal   Peds  Hematology negative hematology ROS (+)   Anesthesia Other Findings Day of surgery medications reviewed with the patient.  Reproductive/Obstetrics                             Anesthesia Physical Anesthesia Plan  ASA: II  Anesthesia Plan: General   Post-op Pain Management:  Regional for Post-op pain   Induction: Intravenous  PONV Risk Score and Plan: 4 or greater and Ondansetron, Dexamethasone, Midazolam and Scopolamine patch - Pre-op  Airway Management Planned: Oral ETT  Additional Equipment:   Intra-op Plan:   Post-operative Plan: Extubation in OR  Informed Consent: I have reviewed the patients History and Physical, chart, labs and discussed the procedure including the risks, benefits and alternatives for the proposed anesthesia with the patient or authorized representative who has indicated his/her understanding and  acceptance.   Dental advisory given  Plan Discussed with: CRNA  Anesthesia Plan Comments: (Risks/benefits of general anesthesia discussed with patient including risk of damage to teeth, lips, gum, and tongue, nausea/vomiting, allergic reactions to medications, and the possibility of heart attack, stroke and death.  All patient questions answered.  Patient wishes to proceed.)        Anesthesia Quick Evaluation

## 2017-05-21 NOTE — Anesthesia Postprocedure Evaluation (Signed)
Anesthesia Post Note  Patient: MADDELYN ROCCA  Procedure(s) Performed: RIGHT SHOULDER ARTHROSCOPY WITH ROTATOR CUFF REPAIR VS DEBRIDEMENT, SUBACROMIAL DECOMPRESSION, DISTAL CLAVICAL EXCISION (Right Shoulder)     Patient location during evaluation: PACU Anesthesia Type: General Level of consciousness: sedated Pain management: pain level controlled Vital Signs Assessment: post-procedure vital signs reviewed and stable Respiratory status: spontaneous breathing and respiratory function stable Cardiovascular status: stable Anesthetic complications: no Comments: Pt had N/V in PACU.  Also c/o chest tightness.  EKG normal, VSS.  Discussed with daughter who expressed concern and desired cardiology consult.    Last Vitals:  Vitals:   05/21/17 2002 05/21/17 2037  BP: 132/82 136/81  Pulse: 81 84  Resp: 17 17  Temp:  36.5 C  SpO2: 98% 97%    Last Pain:  Vitals:   05/21/17 2037  TempSrc: Oral  PainSc:                  Damian Hofstra DANIEL

## 2017-05-21 NOTE — Progress Notes (Signed)
Spoke with Dr. Raiford Simmonds from Cardiology concerning order for patient's nitroglycerin drip. Per [facility], order for nitroglycerin drip discontinued; replaced with order for low dose nitroglycerin ointment.

## 2017-05-21 NOTE — Anesthesia Procedure Notes (Signed)
Anesthesia Regional Block: Interscalene brachial plexus block   Pre-Anesthetic Checklist: ,, timeout performed, Correct Patient, Correct Site, Correct Laterality, Correct Procedure, Correct Position, site marked, Risks and benefits discussed,  Surgical consent,  Pre-op evaluation,  At surgeon's request and post-op pain management  Laterality: Right  Prep: chloraprep       Needles:  Injection technique: Single-shot  Needle Type: Echogenic Needle     Needle Length: 5cm  Needle Gauge: 21     Additional Needles:   Procedures:,,,, ultrasound used (permanent image in chart),,,,  Narrative:  Start time: 05/21/2017 2:30 PM End time: 05/21/2017 2:35 PM Injection made incrementally with aspirations every 5 mL.  Performed by: Personally  Anesthesiologist: Catalina Gravel  Additional Notes: No pain on injection. No increased resistance to injection. Injection made in 5cc increments.  Good needle visualization.  Patient tolerated procedure well.

## 2017-05-21 NOTE — Transfer of Care (Signed)
Immediate Anesthesia Transfer of Care Note  Patient: Melissa Blackburn  Procedure(s) Performed: RIGHT SHOULDER ARTHROSCOPY WITH ROTATOR CUFF REPAIR VS DEBRIDEMENT, SUBACROMIAL DECOMPRESSION, DISTAL CLAVICAL EXCISION (Right Shoulder)  Patient Location: PACU  Anesthesia Type:General  Level of Consciousness: drowsy and patient cooperative  Airway & Oxygen Therapy: Patient Spontanous Breathing and Patient connected to nasal cannula oxygen  Post-op Assessment: Report given to RN  Post vital signs: Reviewed and stable  Last Vitals:  Vitals:   05/21/17 1322 05/21/17 1748  BP: 133/73   Pulse: 76   Resp: 20   Temp: 37 C (P) 36.4 C  SpO2: 100%     Last Pain:  Vitals:   05/21/17 1322  TempSrc: Oral  PainSc:          Complications: No apparent anesthesia complications

## 2017-05-22 DIAGNOSIS — R079 Chest pain, unspecified: Secondary | ICD-10-CM | POA: Diagnosis not present

## 2017-05-22 DIAGNOSIS — G43909 Migraine, unspecified, not intractable, without status migrainosus: Secondary | ICD-10-CM | POA: Diagnosis not present

## 2017-05-22 DIAGNOSIS — I10 Essential (primary) hypertension: Secondary | ICD-10-CM | POA: Diagnosis not present

## 2017-05-22 DIAGNOSIS — G4733 Obstructive sleep apnea (adult) (pediatric): Secondary | ICD-10-CM | POA: Diagnosis not present

## 2017-05-22 DIAGNOSIS — S43401A Unspecified sprain of right shoulder joint, initial encounter: Secondary | ICD-10-CM | POA: Diagnosis not present

## 2017-05-22 DIAGNOSIS — M75101 Unspecified rotator cuff tear or rupture of right shoulder, not specified as traumatic: Secondary | ICD-10-CM | POA: Diagnosis not present

## 2017-05-22 DIAGNOSIS — J45909 Unspecified asthma, uncomplicated: Secondary | ICD-10-CM | POA: Diagnosis not present

## 2017-05-22 DIAGNOSIS — Z9989 Dependence on other enabling machines and devices: Secondary | ICD-10-CM | POA: Diagnosis not present

## 2017-05-22 DIAGNOSIS — Z79899 Other long term (current) drug therapy: Secondary | ICD-10-CM | POA: Diagnosis not present

## 2017-05-22 DIAGNOSIS — R0789 Other chest pain: Secondary | ICD-10-CM | POA: Diagnosis not present

## 2017-05-22 LAB — TROPONIN I

## 2017-05-22 NOTE — Progress Notes (Signed)
   PATIENT ID: Melissa Blackburn   1 Day Post-Op Procedure(s) (LRB): RIGHT SHOULDER ARTHROSCOPY WITH ROTATOR CUFF REPAIR VS DEBRIDEMENT, SUBACROMIAL DECOMPRESSION, DISTAL CLAVICAL EXCISION (Right)  Subjective: Doing well this am. Denies chest pain/tightness, SOB. R shoulder pain as expected.  Objective:  Vitals:   05/22/17 0509 05/22/17 0605  BP: (!) 91/51 (!) 122/55  Pulse: 76 66  Resp: 18   Temp: 98.3 F (36.8 C)   SpO2: 96%      R shoulder dressing c/d/i Wiggles fingers, distally NVI  Labs:   Recent Labs  05/19/17 1330  HGB 13.3   Recent Labs  05/19/17 1330  WBC 4.2  RBC 4.48  HCT 41.4  PLT 226   Recent Labs  05/19/17 1330  NA 141  K 3.5  CL 106  CO2 26  BUN 9  CREATININE 0.84  GLUCOSE 83  CALCIUM 9.1    Assessment and Plan: 1 day s/p right arthroscopic RCR Held overnight for cardiac monitoring, troponins cycled and WNL, symtpoms resolved D/c home today when ready  Fu with Dr. Tamera Punt in 1 week   VTE proph: asa,scds

## 2017-05-22 NOTE — Progress Notes (Signed)
Subjective:  No chest pain overnight.  EKG and enzymes negative  Objective:  Vital Signs in the last 24 hours: BP (!) 122/55   Pulse 66   Temp 98.3 F (36.8 C) (Oral)   Resp 18   Ht 5\' 11"  (1.803 m)   Wt 86.2 kg (190 lb)   SpO2 96%   BMI 26.50 kg/m   Physical Exam: Pleasant WF in NAD Lungs:  Clear Cardiac:  Regular rhythm, normal S1 and S2, no S3 Extremities:  Shoulder bandaged and immobile  Intake/Output from previous day: 10/11 0701 - 10/12 0700 In: 2004.2 [P.O.:400; I.V.:1604.2] Out: 100 [Blood:100]  Weight Filed Weights   05/21/17 1324 05/21/17 2037  Weight: 86.2 kg (190 lb) 86.2 kg (190 lb)    Lab Results: Basic Metabolic Panel:  Recent Labs  05/19/17 1330  NA 141  K 3.5  CL 106  CO2 26  GLUCOSE 83  BUN 9  CREATININE 0.84   CBC:  Recent Labs  05/19/17 1330  WBC 4.2  HGB 13.3  HCT 41.4  MCV 92.4  PLT 226   Cardiac Panel (last 3 results)  Recent Labs  05/21/17 2222 05/22/17 0128  TROPONINI <0.03 <0.03    Telemetry: Reviewed sinus rhythm  Assessment/Plan:  1.  Isolated episode of chest tightness on awakening from anesthesia that has resolved 2. Hypertension  Rec:  OK to d/c from cardiac viewpoint.  EKG no change and enzymes normal.  Should f/w with her regular MD.  No further cardiac w/u necessary unless recurrent symptoms.      Kerry Hough  MD [facility]  05/22/2017, 8:50 AM

## 2017-05-22 NOTE — Anesthesia Postprocedure Evaluation (Signed)
Anesthesia Post Note  Patient: Melissa Blackburn  Procedure(s) Performed: RIGHT SHOULDER ARTHROSCOPY WITH ROTATOR CUFF REPAIR VS DEBRIDEMENT, SUBACROMIAL DECOMPRESSION, DISTAL CLAVICAL EXCISION (Right Shoulder)     Patient location during evaluation: PACU Anesthesia Type: General Level of consciousness: awake and alert Pain management: pain level controlled Vital Signs Assessment: post-procedure vital signs reviewed and stable Respiratory status: spontaneous breathing, nonlabored ventilation and respiratory function stable Cardiovascular status: blood pressure returned to baseline and stable Postop Assessment: no apparent nausea or vomiting Anesthetic complications: no    Last Vitals:  Vitals:   05/22/17 0509 05/22/17 0605  BP: (!) 91/51 (!) 122/55  Pulse: 76 66  Resp: 18   Temp: 36.8 C   SpO2: 96%     Last Pain:  Vitals:   05/22/17 0509  TempSrc: Oral  PainSc:                  Catalina Gravel

## 2017-05-22 NOTE — Progress Notes (Signed)
Pt is ambulating, right arm sling on, right shoulder dressing dry and intact. Pain is controlled. Discharge instructions given. Discharged to home accompanied by spouse.

## 2017-05-22 NOTE — Discharge Summary (Signed)
Patient ID: Melissa Blackburn MRN: [id number] DOB/AGE: 1956-03-16 61 y.o.  Admit date: 05/21/2017 Discharge date: 05/22/2017  Admission Diagnoses:  Active Problems:   S/P shoulder surgery   S/P right rotator cuff repair   Discharge Diagnoses:  Same  Past Medical History:  Diagnosis Date  . Anemia    hx of  . Asthma    "adult onset" (09/11/2016)  . DDD (degenerative disc disease)    "hands, knees" (09/11/2016)  . Hypertension   . Migraine    "none since stimulator placed 07/2016" (09/11/2016)  . Needle stick injury with contaminated needle    patient undergoing testing  . Numbness and tingling 11/15/2012  . OSA on CPAP    cpap 7 (09/11/2016)  . Pain in limb 11/15/2012  . Paresthesias 11/15/2012  . Pneumonia 1980s X 1  . PONV (postoperative nausea and vomiting)    "severe nausea and vomiting"  . Weakness generalized 11/15/2012    Surgeries: Procedure(s): RIGHT SHOULDER ARTHROSCOPY WITH ROTATOR CUFF REPAIR VS DEBRIDEMENT, SUBACROMIAL DECOMPRESSION, DISTAL CLAVICAL EXCISION on 05/21/2017   Consultants: Treatment Team:  Lbcardiology, Michae Kava, MD Jacolyn Reedy, MD  Discharged Condition: Improved  Hospital Course: Melissa Blackburn is an 61 y.o. female who was admitted 05/21/2017 for operative treatment of right shoulder partial rotator cuff tear.  Patient has severe unremitting pain that affects sleep, daily activities, and work/hobbies. After pre-op clearance the patient was taken to the operating room on 05/21/2017 and underwent  Procedure(s): RIGHT SHOULDER ARTHROSCOPY WITH ROTATOR CUFF REPAIR VS DEBRIDEMENT, SUBACROMIAL DECOMPRESSION, DISTAL CLAVICAL EXCISION.    Patient was given perioperative antibiotics: Anti-infectives    Start     Dose/Rate Route Frequency Ordered Stop   05/21/17 1500  ceFAZolin (ANCEF) IVPB 2g/100 mL premix     2 g 200 mL/hr over 30 Minutes Intravenous On call to O.R. 05/21/17 1257 05/21/17 1610   05/21/17 1300  ceFAZolin (ANCEF) 2-4 GM/100ML-% IVPB     Comments:  Starleen Arms   : cabinet override      05/21/17 1300 05/21/17 1610       Patient was given sequential compression devices, early ambulation, and asa to prevent DVT. Post operatively had chest pain, spontaneously resolved. Cardiology was consulted, EKG and troponins ordered and WNL. Kept overnight for observation with no residual symptoms.   Patient benefited maximally from hospital stay and there were no complications.    Recent vital signs: Patient Vitals for the past 24 hrs:  BP Temp Temp src Pulse Resp SpO2 Height Weight  05/22/17 0605 (!) 122/55 - - 66 - - - -  05/22/17 0509 (!) 91/51 98.3 F (36.8 C) Oral 76 18 96 % - -  05/21/17 2200 125/69 - - 88 - - - -  05/21/17 2037 136/81 97.7 F (36.5 C) Oral 84 17 97 % 5\' 11"  (1.803 m) 86.2 kg (190 lb)  05/21/17 2002 132/82 - - 81 17 98 % - -  05/21/17 2000 - - - 83 17 98 % - -  05/21/17 1947 135/81 - - 83 20 98 % - -  05/21/17 1945 - - - 83 16 98 % - -  05/21/17 1932 138/83 - - 87 14 100 % - -  05/21/17 1930 - - - 90 18 100 % - -  05/21/17 1917 130/80 - - 84 16 97 % - -  05/21/17 1915 - - - 86 14 98 % - -  05/21/17 1902 128/83 - - 86 17 98 % - -  05/21/17 1900 - - - 87 13 98 % - -  05/21/17 1847 128/79 - - 88 14 98 % - -  05/21/17 1845 - - - 88 15 98 % - -  05/21/17 1832 126/75 - - 87 14 99 % - -  05/21/17 1830 - - - 83 13 98 % - -  05/21/17 1815 - - - 89 16 98 % - -  05/21/17 1800 - - - 95 13 98 % - -  05/21/17 1748 - 97.6 F (36.4 C) - 93 14 100 % - -  05/21/17 1747 129/74 - - 92 (!) 8 100 % - -     Recent laboratory studies: No results for input(s): WBC, HGB, HCT, PLT, NA, K, CL, CO2, BUN, CREATININE, GLUCOSE, INR, CALCIUM in the last 72 hours.  Invalid input(s): PT, 2   Discharge Medications:   Allergies as of 05/22/2017      Reactions   Verapamil Other (See Comments)   Heart block per patient   Avelox [moxifloxacin Hcl In Nacl] Diarrhea   Flexeril [cyclobenzaprine Hcl] Other (See Comments)    Systolic Levels "bottoms out"   Imitrex [sumatriptan] Other (See Comments)   "Messes with my blood pressure - will cause it to go up and then down"   Midazolam Hcl Nausea And Vomiting   Neurontin [gabapentin] Other (See Comments)   Muscle spasms   Pregabalin Other (See Comments)   Muscle spasms   Tape Other (See Comments)   Blisters skin      Medication List    STOP taking these medications   acetaminophen 500 MG tablet Commonly known as:  TYLENOL     TAKE these medications   amitriptyline 50 MG tablet Commonly known as:  ELAVIL Take 50 mg by mouth at bedtime. For migraines.   atenolol 25 MG tablet Commonly known as:  TENORMIN Take 25 mg by mouth at bedtime.   carisoprodol 350 MG tablet Commonly known as:  SOMA Take 350 mg by mouth at bedtime.   docusate sodium 100 MG capsule Commonly known as:  COLACE Take 1 capsule (100 mg total) by mouth 3 (three) times daily as needed.   methocarbamol 500 MG tablet Commonly known as:  ROBAXIN Take 500 mg by mouth 3 (three) times daily as needed for muscle spasms.   oxyCODONE-acetaminophen 5-325 MG tablet Commonly known as:  ROXICET Take 1-2 tablets by mouth every 4 (four) hours as needed for severe pain.   PROAIR HFA 108 (90 Base) MCG/ACT inhaler Generic drug:  albuterol Inhale 2 puffs into the lungs every 6 (six) hours as needed for wheezing or shortness of breath.       Diagnostic Studies: No results found.  Disposition: 01-Home or Self Care  Discharge Instructions    Call MD / Call 911    Complete by:  As directed    If you experience chest pain or shortness of breath, CALL 911 and be transported to the hospital emergency room.  If you develope a fever above 101 F, pus (white drainage) or increased drainage or redness at the wound, or calf pain, call your surgeon's office.   Constipation Prevention    Complete by:  As directed    Drink plenty of fluids.  Prune juice may be helpful.  You may use a stool softener,  such as Colace (over the counter) 100 mg twice a day.  Use MiraLax (over the counter) for constipation as needed.   Diet - low sodium heart healthy  Complete by:  As directed    Increase activity slowly as tolerated    Complete by:  As directed       Follow-up Information    Tania Ade, MD. Schedule an appointment as soon as possible for a visit in 2 weeks.   Specialty:  Orthopedic Surgery Contact information: [place] [address] [phone]            Signed: Grier Mitts 05/22/2017, 2:52 PM

## 2017-05-26 ENCOUNTER — Encounter (HOSPITAL_COMMUNITY): Payer: Self-pay | Admitting: Orthopedic Surgery

## 2017-05-28 MED FILL — ATENOLOL 25 MG TABLET: 25 | 90 days supply | Qty: 90 | Fill #0 | Status: TO

## 2017-05-29 DIAGNOSIS — Z9889 Other specified postprocedural states: Secondary | ICD-10-CM | POA: Diagnosis not present

## 2017-05-29 DIAGNOSIS — M75111 Incomplete rotator cuff tear or rupture of right shoulder, not specified as traumatic: Secondary | ICD-10-CM | POA: Diagnosis not present

## 2017-06-05 ENCOUNTER — Encounter (HOSPITAL_COMMUNITY): Payer: Self-pay | Admitting: Occupational Therapy

## 2017-06-05 ENCOUNTER — Ambulatory Visit (HOSPITAL_COMMUNITY): Payer: 59 | Attending: Obstetrics & Gynecology | Admitting: Occupational Therapy

## 2017-06-05 DIAGNOSIS — M545 Low back pain: Secondary | ICD-10-CM | POA: Insufficient documentation

## 2017-06-05 DIAGNOSIS — M25511 Pain in right shoulder: Secondary | ICD-10-CM | POA: Insufficient documentation

## 2017-06-05 DIAGNOSIS — G8929 Other chronic pain: Secondary | ICD-10-CM | POA: Insufficient documentation

## 2017-06-05 DIAGNOSIS — M6281 Muscle weakness (generalized): Secondary | ICD-10-CM | POA: Diagnosis present

## 2017-06-05 DIAGNOSIS — R29898 Other symptoms and signs involving the musculoskeletal system: Secondary | ICD-10-CM | POA: Insufficient documentation

## 2017-06-05 NOTE — Therapy (Signed)
[place], [place], [address] Phone: [phone]   Fax:  [phone]  Occupational Therapy Evaluation  Patient Details  Name: Melissa Blackburn MRN: [id number] Date of Birth: 1955/08/28 Referring Provider: Dr. Malena Catholic  Encounter Date: 06/05/2017      OT End of Session - 06/05/17 1605    Visit Number 1   Number of Visits 16   Date for OT Re-Evaluation 08/04/17  mini reassessment 07/04/17   Authorization Type UMR   Authorization Time Period $20 copay per visit   OT Start Time 1520   OT Stop Time 1550   OT Time Calculation (min) 30 min   Activity Tolerance Patient tolerated treatment well   Behavior During Therapy [facility] for tasks assessed/performed      Past Medical History:  Diagnosis Date  . Anemia    hx of  . Asthma    "adult onset" (09/11/2016)  . DDD (degenerative disc disease)    "hands, knees" (09/11/2016)  . Hypertension   . Migraine    "none since stimulator placed 07/2016" (09/11/2016)  . Needle stick injury with contaminated needle    patient undergoing testing  . Numbness and tingling 11/15/2012  . OSA on CPAP    cpap 7 (09/11/2016)  . Pain in limb 11/15/2012  . Paresthesias 11/15/2012  . Pneumonia 1980s X 1  . PONV (postoperative nausea and vomiting)    "severe nausea and vomiting"  . Weakness generalized 11/15/2012    Past Surgical History:  Procedure Laterality Date  . BACK SURGERY    . ESOPHAGOGASTRODUODENOSCOPY (EGD) WITH PROPOFOL N/A 01/10/2016   Procedure: ESOPHAGOGASTRODUODENOSCOPY (EGD) WITH PROPOFOL;  Surgeon: Milus Banister, MD;  Location: [facility];  Service: Endoscopy;  Laterality: N/A;  . KNEE ARTHROSCOPY  04/23/2012   Procedure: ARTHROSCOPY KNEE;  Surgeon: Augustin Schooling, MD;  Location: [place];  Service: Orthopedics;  Laterality: Left;  lateral meniscectomy  . KNEE ARTHROSCOPY Bilateral    "bone fragments"  . KNEE ARTHROSCOPY W/ ACL RECONSTRUCTION Left 2009  . KNEE  ARTHROSCOPY W/ MENISCAL REPAIR Left 1981  . KNEE ARTHROSCOPY W/ MENISCAL REPAIR Left 2012  . LUMBAR FUSION Left 09/11/2016   LUMBAR 4-5 TRANSFORAMINAL LUMBAR INTERBODY FUSION WITH INSTRUMENTATION AND Gertha Calkin Archie Endo 09/11/2016  . PERIPHERAL NERVE STIMULATOR Right 07/2016   "in my chest; to treat migraine headaches"  . RECTOCELE REPAIR    . SACROILIAC JOINT FUSION Left 04/09/2017   Procedure: LEFT SIDED SACROILIAC JOINT FUSION;  Surgeon: Phylliss Bob, MD;  Location: [place];  Service: Orthopedics;  Laterality: Left;  . SHOULDER ARTHROSCOPY W/ SUPERIOR LABRAL ANTERIOR POSTERIOR REPAIR Bilateral    "I have rivets in both shoulders to hold them together" (09/11/2016)  . SHOULDER ARTHROSCOPY WITH ROTATOR CUFF REPAIR AND SUBACROMIAL DECOMPRESSION Right 05/21/2017   Procedure: RIGHT SHOULDER ARTHROSCOPY WITH ROTATOR CUFF REPAIR VS DEBRIDEMENT, SUBACROMIAL DECOMPRESSION, DISTAL CLAVICAL EXCISION;  Surgeon: Tania Ade, MD;  Location: [place];  Service: Orthopedics;  Laterality: Right;  SHOULDER ARTHROSCOPY WITH ROTATOR CUFF REPAIR VS DEBRIDEMENT, SUBACROMIAL DECOMPRESSION, DISTAL CLAVICAL EXCISION  . SHOULDER ARTHROSCOPY WITH SUBACROMIAL DECOMPRESSION Right 1994 X 2; 1995; ~ 2010  . UPPER ESOPHAGEAL ENDOSCOPIC ULTRASOUND (EUS)  01/10/2016   Procedure: UPPER ESOPHAGEAL ENDOSCOPIC ULTRASOUND (EUS);  Surgeon: Milus Banister, MD;  Location: [facility];  Service: Endoscopy;;  . VAGINAL HYSTERECTOMY  1986   "partial"    There were no vitals filed for this visit.      Subjective Assessment - 06/05/17  1601    Subjective  S: This is my 5th surgery on this shoulder.    Pertinent History Pt is a 61 y/o female s/p right arthroscopic RCR on 05/21/17. Pt has had multiple surgeries on this shoulder including SLAP repair, manipulation, RCR, and SAD. Pt is wearing sling at all times, using ice for pain. Dr. Tania Ade has referred pt to occupational therapy for evaluation and treatment.    Patient Stated  Goals To be able to use my right arm.    Currently in Pain? Yes   Pain Score 3    Pain Location Shoulder   Pain Orientation Right   Pain Descriptors / Indicators Aching;Sore   Pain Type Acute pain   Pain Radiating Towards elbow   Pain Onset 1 to 4 weeks ago   Pain Frequency Constant   Aggravating Factors  movement   Pain Relieving Factors pain medication, ice   Effect of Pain on Daily Activities unable to use RUE for daily tasks   Multiple Pain Sites No           [facility] OT Assessment - 06/05/17 1520      Assessment   Diagnosis Right Rotator Cuff Repair   Referring Provider Dr. Tania Ade   Onset Date 05/21/17   Prior Therapy None     Precautions   Precautions Shoulder   Type of Shoulder Precautions See protocol: Weeks 0-4 (10/11-11/8): Pendulums, P/ROM to tolerance, elbow/wrist A/ROM, scapular musculature isometrics. Week 5 (11/9-11/16): Continue P/ROM working to [employer], Initiate AA/ROM flexion in supine, prone rowing to neutral. Weeks 6-8 (11/16-11/30): AA/ROM progressing to A/ROM, shoulder stretching, rotator cuff isometrics. Week 10: Begin strengthening as tolerated.    Shoulder Interventions Shoulder sling/immobilizer;At all times     Balance Screen   Has the patient fallen in the past 6 months Yes   How many times? 1   Has the patient had a decrease in activity level because of a fear of falling?  No   Is the patient reluctant to leave their home because of a fear of falling?  No     Prior Function   Level of Independence Independent   Vocation Full time employment   Vocation Requirements ER nurse: pushing, pulling, lifting   Leisure Outdoor activities-hunting, fishing, football with grandkids, basketball with church kids     ADL   ADL comments Pt is unable to use RUE for any tasks at this time.      Written Expression   Dominant Hand Right     Cognition   Overall Cognitive Status Within Functional Limits for tasks assessed     ROM / Strength   AROM / PROM  / Strength AROM;PROM;Strength     Palpation   Palpation comment Max fascial restrictions in right upper arm, trapezius, and scapularis regions     AROM   Overall AROM  Deficits;Unable to assess;Due to precautions     PROM   Overall PROM Comments Assessed supine, er/IR adducted   PROM Assessment Site Shoulder   Right/Left Shoulder Right   Right Shoulder Flexion 120 Degrees   Right Shoulder ABduction 86 Degrees   Right Shoulder Internal Rotation 90 Degrees   Right Shoulder External Rotation 31 Degrees     Strength   Overall Strength Deficits;Unable to assess;Due to precautions                         OT Education - 06/05/17 1543    Education provided  Yes   Education Details table slides   Person(s) Educated Patient   Methods Explanation;Demonstration;Handout   Comprehension Verbalized understanding;Returned demonstration          OT Short Term Goals - 06/05/17 1622      OT SHORT TERM GOAL #1   Title Pt will be provided with and educated on HEP to improve mobility in RUE for use as dominant during functional tasks.    Time 4   Period Weeks   Status New   Target Date 07/03/17     OT SHORT TERM GOAL #2   Title Pt will increase P/ROM of RUE to WNL to improve ability to donn shirts with greater ease.    Time 4   Period Weeks   Status New     OT SHORT TERM GOAL #3   Title Pt will decrease RUE pain to 4/10 to improve ability to sleep.    Time 4   Period Weeks   Status New           OT Long Term Goals - 06/05/17 1624      OT LONG TERM GOAL #1   Title Pt will return to highest level of functioning and independence in daily tasks using RUE as dominant.    Time 8   Period Weeks   Status New   Target Date 08/04/17     OT LONG TERM GOAL #2   Title Pt will decrease pain in RUE to 2/10 or less to improve ability to improve ability to perform school tasks on the computer with greater ease.    Time 8   Period Weeks   Status New     OT LONG TERM  GOAL #3   Title Pt will decrease fascial restrictions in RUE from max amounts to min amounts or less to improve mobility required for functional reaching tasks.    Time 8   Period Weeks   Status New     OT LONG TERM GOAL #4   Title Pt will improve RUE A/ROM to [facility] to improve ability to reach overhead when working or during school tasks.    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    Time 8   Period Weeks   Status New               Plan - 06/05/17 1605    Clinical Impression Statement A: Pt is a 61 y/o female s/p right RCR on 05/21/17; pt injured her shoulder when lifting a bucket of tomatos over a fence. Pt presents with limitations in functional use of RUE as dominant. Provided with table slide HEP this session.    Occupational Profile and client history currently impacting functional performance Pt is very active and independent prior to surgery and is motivated to return to PLOF.    Occupational performance deficits (Please refer to evaluation for details): ADL's;IADL's;Rest and Sleep;Education;Work;Leisure   Rehab Potential Good   OT Frequency 2x / week   OT Duration 8 weeks   OT Treatment/Interventions Self-care/ADL training;Therapeutic exercise;Patient/family education;Ultrasound;Manual Therapy;Cryotherapy;Therapeutic activities;Electrical Stimulation;Moist Heat;Passive range of motion   Plan P: Pt will benefit from skilled OT intervention to decrease pain and fascial restrictions and increase ROM, strength, and functional use of RUE as dominant during daily, work, school, and leisure tasks. Treatment  plan: myofascial release, manual therapy, P/ROM, AA/ROM, A/ROM, scapular stability and strengthening, general RUE strengthening, modalities prn   Clinical Decision Making Limited treatment options, no task modification necessary   OT Home Exercise Plan  10/26: table slides   Consulted and Agree with Plan of Care Patient      Patient will benefit from skilled therapeutic intervention in order to improve the following deficits and impairments:  Decreased activity tolerance, Decreased strength, Impaired flexibility, Decreased range of motion, Pain, Impaired UE functional use, Increased fascial restricitons  Visit Diagnosis: Acute pain of right shoulder  Other symptoms and signs involving the musculoskeletal system    Problem List Patient Active Problem List   Diagnosis Date Noted  . S/P shoulder surgery 05/21/2017  . S/P right rotator cuff repair 05/21/2017  . Radiculopathy 09/11/2016  . Weakness generalized 11/15/2012  . Paresthesias 11/15/2012  . Pain in limb 11/15/2012  . Numbness and tingling 11/15/2012   Guadelupe Sabin, OTR/L  [phone] 06/05/2017, 4:28 PM  [place] [address] [place], [place], [address] Phone: [phone]   Fax:  [phone]  Name: Melissa Blackburn MRN: [id number] Date of Birth: October 28, 1955

## 2017-06-05 NOTE — Patient Instructions (Signed)
SHOULDER: Flexion On Table   Place hands on table, elbows straight. Move hips away from body. Press hands down into table.  _10-15__ reps per set, _2-3__ sets per day  Abduction (Passive)   With arm out to side, resting on table, lower head toward arm, keeping trunk away from table.  Repeat __10-15__ times. Do __2-3__ sessions per day.  Copyright  [employer]. All rights reserved.     Internal Rotation (Assistive)   Seated with elbow bent at right angle and held against side, slide arm on table surface in an inward arc. Repeat _10-15___ times. Do _2-3___ sessions per day. Activity: Use this motion to brush crumbs off the table.  Copyright  [employer]. All rights reserved.

## 2017-06-08 ENCOUNTER — Ambulatory Visit (HOSPITAL_COMMUNITY): Payer: PRIVATE HEALTH INSURANCE | Attending: Orthopedic Surgery | Admitting: Physical Therapy

## 2017-06-08 DIAGNOSIS — M545 Low back pain, unspecified: Secondary | ICD-10-CM

## 2017-06-08 DIAGNOSIS — M6281 Muscle weakness (generalized): Secondary | ICD-10-CM

## 2017-06-08 DIAGNOSIS — G8929 Other chronic pain: Secondary | ICD-10-CM

## 2017-06-08 NOTE — Therapy (Signed)
[place], [place], [address] Phone: [phone]   Fax:  [phone]  Physical Therapy Evaluation  Patient Details  Name: Melissa Blackburn MRN: [id number] Date of Birth: 11/11/1955 Referring Provider: Phylliss Bob  Encounter Date: 06/08/2017      PT End of Session - 06/08/17 1429    Visit Number 1   Number of Visits 8   Date for PT Re-Evaluation 07/08/17   Authorization Type work comp   Authorization - Visit Number 1   Authorization - Number of Visits 8   PT Start Time 1950   PT Stop Time 1427   PT Time Calculation (min) 29 min   Activity Tolerance Patient tolerated treatment well   Behavior During Therapy [facility] for tasks assessed/performed      Past Medical History:  Diagnosis Date  . Anemia    hx of  . Asthma    "adult onset" (09/11/2016)  . DDD (degenerative disc disease)    "hands, knees" (09/11/2016)  . Hypertension   . Migraine    "none since stimulator placed 07/2016" (09/11/2016)  . Needle stick injury with contaminated needle    patient undergoing testing  . Numbness and tingling 11/15/2012  . OSA on CPAP    cpap 7 (09/11/2016)  . Pain in limb 11/15/2012  . Paresthesias 11/15/2012  . Pneumonia 1980s X 1  . PONV (postoperative nausea and vomiting)    "severe nausea and vomiting"  . Weakness generalized 11/15/2012    Past Surgical History:  Procedure Laterality Date  . BACK SURGERY    . ESOPHAGOGASTRODUODENOSCOPY (EGD) WITH PROPOFOL N/A 01/10/2016   Procedure: ESOPHAGOGASTRODUODENOSCOPY (EGD) WITH PROPOFOL;  Surgeon: Milus Banister, MD;  Location: [facility];  Service: Endoscopy;  Laterality: N/A;  . KNEE ARTHROSCOPY  04/23/2012   Procedure: ARTHROSCOPY KNEE;  Surgeon: Augustin Schooling, MD;  Location: [place];  Service: Orthopedics;  Laterality: Left;  lateral meniscectomy  . KNEE ARTHROSCOPY Bilateral    "bone fragments"  . KNEE ARTHROSCOPY W/ ACL RECONSTRUCTION Left 2009  . KNEE ARTHROSCOPY W/  MENISCAL REPAIR Left 1981  . KNEE ARTHROSCOPY W/ MENISCAL REPAIR Left 2012  . LUMBAR FUSION Left 09/11/2016   LUMBAR 4-5 TRANSFORAMINAL LUMBAR INTERBODY FUSION WITH INSTRUMENTATION AND Gertha Calkin Archie Endo 09/11/2016  . PERIPHERAL NERVE STIMULATOR Right 07/2016   "in my chest; to treat migraine headaches"  . RECTOCELE REPAIR    . SACROILIAC JOINT FUSION Left 04/09/2017   Procedure: LEFT SIDED SACROILIAC JOINT FUSION;  Surgeon: Phylliss Bob, MD;  Location: [place];  Service: Orthopedics;  Laterality: Left;  . SHOULDER ARTHROSCOPY W/ SUPERIOR LABRAL ANTERIOR POSTERIOR REPAIR Bilateral    "I have rivets in both shoulders to hold them together" (09/11/2016)  . SHOULDER ARTHROSCOPY WITH ROTATOR CUFF REPAIR AND SUBACROMIAL DECOMPRESSION Right 05/21/2017   Procedure: RIGHT SHOULDER ARTHROSCOPY WITH ROTATOR CUFF REPAIR VS DEBRIDEMENT, SUBACROMIAL DECOMPRESSION, DISTAL CLAVICAL EXCISION;  Surgeon: Tania Ade, MD;  Location: [place];  Service: Orthopedics;  Laterality: Right;  SHOULDER ARTHROSCOPY WITH ROTATOR CUFF REPAIR VS DEBRIDEMENT, SUBACROMIAL DECOMPRESSION, DISTAL CLAVICAL EXCISION  . SHOULDER ARTHROSCOPY WITH SUBACROMIAL DECOMPRESSION Right 1994 X 2; 1995; ~ 2010  . UPPER ESOPHAGEAL ENDOSCOPIC ULTRASOUND (EUS)  01/10/2016   Procedure: UPPER ESOPHAGEAL ENDOSCOPIC ULTRASOUND (EUS);  Surgeon: Milus Banister, MD;  Location: [facility];  Service: Endoscopy;;  . VAGINAL HYSTERECTOMY  1986   "partial"    There were no vitals filed for this visit.       Subjective  Assessment - 06/08/17 1351    Subjective Pt initially injured at work,03/2016, she had therapy for her back, a lumbar fusion and now a Lt SI fusion.  She is continuing to have back pain therefore she is being referred back to physical therapy.   Pertinent History 5 Rt shoulder surgeries, SI injection, DDD, permenant nerve stimulator , knee surgery    How long can you sit comfortably? increased pain after 2 hours.    How long can you stand  comfortably? Pt states that it is difficult to judge; she has not noticed any limitation.    How long can you walk comfortably? Pt states that she becomes uncomfortable after shopping for a couple of hours.    Currently in Pain? No/denies  highest pain goes up to is a 4/10    Pain Location Back   Pain Orientation Lower   Pain Descriptors / Indicators Aching   Pain Type Chronic pain   Pain Radiating Towards none   Pain Onset More than a month ago   Pain Frequency Constant   Aggravating Factors  being up for to long, lifting and pushing but pt is unable to do this activity at this time due to recent rotator cuff surgery.    Pain Relieving Factors heat and ice    Effect of Pain on Daily Activities increases    Multiple Pain Sites Yes  being addressed with OT    Pain Score 5   Pain Location Shoulder   Pain Orientation Right   Pain Descriptors / Indicators Aching;Throbbing            [facility] PT Assessment - 06/08/17 0001      Assessment   Medical Diagnosis S/P Lt SI fusion   Referring Provider Phylliss Bob   Onset Date/Surgical Date 04/09/17   Next MD Visit 06/30/2017   Prior Therapy in 2017     Precautions   Precautions Other (comment)  recent rotator cuff surgery RT     Restrictions   Weight Bearing Restrictions Yes   Other Position/Activity Restrictions Rt UE     Balance Screen   Has the patient fallen in the past 6 months No   Has the patient had a decrease in activity level because of a fear of falling?  Yes   Is the patient reluctant to leave their home because of a fear of falling?  No     Home Ecologist residence     Prior Function   Level of Independence Independent   Vocation Full time employment   Vocation Requirements ER Nurse    Leisure hunt and fish      Cognition   Overall Cognitive Status Within Functional Limits for tasks assessed     Observation/Other Assessments   Focus on Therapeutic Outcomes (FOTO)  72      Functional Tests   Functional tests Sit to Stand     Sit to Stand   Comments 5 x 8,23      Posture/Postural Control   Posture/Postural Control Postural limitations     ROM / Strength   AROM / PROM / Strength Strength;AROM     AROM   AROM Assessment Site Lumbar   Lumbar Extension 20     Strength   Strength Assessment Site Ankle;Knee;Hip   Right/Left Hip Right;Left   Right Hip Flexion 5/5   Right Hip Extension 4/5   Right Hip ABduction 4/5   Left Hip Flexion 5/5   Left Hip  External Rotation 4/5   Left Hip ABduction 4/5   Right/Left Knee Right;Left   Right Knee Flexion 5/5   Right Knee Extension 5/5   Left Knee Flexion 5/5   Left Knee Extension 5/5   Right/Left Ankle Right;Left   Right Ankle Dorsiflexion 4-/5   Left Ankle Dorsiflexion 4+/5     Flexibility   Soft Tissue Assessment /Muscle Length yes   Hamstrings LT 162; RT 159            Objective measurements completed on examination: See above findings.          [facility] Adult PT Treatment/Exercise - 06/08/17 0001      Exercises   Exercises Lumbar     Lumbar Exercises: Stretches   Standing Extension 5 reps     Lumbar Exercises: Standing   Heel Raises 10 reps   Functional Squats 10 reps     Lumbar Exercises: Seated   Sit to Stand 5 reps                PT Education - 06/08/17 1428    Education provided Yes   Education Details Hep   Person(s) Educated Patient   Methods Explanation   Comprehension Verbalized understanding          PT Short Term Goals - 06/08/17 1439      PT SHORT TERM GOAL #1   Title Pt to be I in HEP to increase LE strength to allow pt to come from sit to stand with ease from low lying couches.    Time 2   Period Weeks   Status New   Target Date 06/22/17     PT SHORT TERM GOAL #2   Title Pt to be able to ascend and descend two flights of steps without increased back pain.    Time 2   Period Weeks   Status New           PT Long Term Goals - 06/08/17  1441      PT LONG TERM GOAL #1   Title Pt to be able to tolerate shopping for 2 hours without experiencing increased back pain    Time 4   Period Weeks   Status New   Target Date 07/06/17     PT LONG TERM GOAL #2   Title PT to be able to walk on uneven terrain for hunting and fishing for two hours without increased back pain.  (Pt will not be able to physically complete these activities secondary to Rt RTC surgery).   Time 4   Period Weeks   Status New     PT LONG TERM GOAL #3   Title Pt leg strength increased to where pt is able to get in and out of a bathtub without difficulty   Time 4   Period Weeks   Status New     PT LONG TERM GOAL #4   Title Pt to state that she has not experienced low back pain greater than a 1/10 for the past week    Time 4   Period Weeks   Status New                Plan - 06/08/17 1430    Clinical Impression Statement Ms. Leisey is a 61 yo female who has had chronic radicular pain since a work injury in August of 2017.  She has had physical therapy twice, a lumbar fusion, and recently a Lt SI fusion.  She is  now being referred to skilled out patient physical therapy.  Evaluation demonstrates decreased LE strength, decreased activity tolerance.   She will benefit from skillled PT to address these issues and maximize her functional tolerance.    History and Personal Factors relevant to plan of care: 5 Rt shoulder surgeries with recent rotator cuff tear; lumbar fusion, permenant N stimulator, DDD, knee surgery    Clinical Presentation Stable   Clinical Decision Making Low   Rehab Potential Good   PT Frequency 2x / week   PT Duration 4 weeks   PT Treatment/Interventions ADLs/Self Care Home Management;Stair training;Functional mobility training;Therapeutic activities;Therapeutic exercise;Ultrasound;Moist Heat;Iontophoresis 4mg /ml Dexamethasone;Patient/family education;Manual techniques   PT Next Visit Plan Begin lunges, step ups , lateral step ups,  wall slides, LTR and hamstring stretches.   PT Home Exercise Plan EVAL:  functional squat, heel raises and sit to stands.    Consulted and Agree with Plan of Care Patient      Patient will benefit from skilled therapeutic intervention in order to improve the following deficits and impairments:  Decreased activity tolerance, Decreased strength, Difficulty walking, Pain  Visit Diagnosis: Muscle weakness (generalized) - Plan: PT plan of care cert/re-cert  Chronic bilateral low back pain without sciatica - Plan: PT plan of care cert/re-cert     Problem List Patient Active Problem List   Diagnosis Date Noted  . S/P shoulder surgery 05/21/2017  . S/P right rotator cuff repair 05/21/2017  . Radiculopathy 09/11/2016  . Weakness generalized 11/15/2012  . Paresthesias 11/15/2012  . Pain in limb 11/15/2012  . Numbness and tingling 11/15/2012    Rayetta Humphrey, PT CLT [phone] 06/08/2017, 2:48 PM  [place] [address] [place], [place], [address] Phone: [phone]   Fax:  [phone]  Name: Melissa Blackburn MRN: [id number] Date of Birth: Aug 23, 1955

## 2017-06-08 NOTE — Patient Instructions (Addendum)
Backward Bend (Standing)    Arch backward to make hollow of back deeper. Hold ____ seconds. Repeat ____ times per set. Do ____ sets per session. Do ____ sessions per day.  http://orth.exer.us/178   Copyright  VHI. All rights reserved.  Functional Quadriceps: Chair Squat   Complete in front of a couch or bed  Keeping feet flat on floor, shoulder width apart, squat as low as is comfortable. Use support as necessary. Repeat _10___ times per set. Do _1___ sets per session. Do ___2_ sessions per day.  http://orth.exer.us/736   Copyright  VHI. All rights reserved.  Functional Quadriceps: Sit to Stand    Sit on edge of chair, feet flat on floor. Stand upright, extending knees fully. Repeat 10____ times per set. Do ___1_ sets per session. Do _2___ sessions per day.  http://orth.exer.us/734   Copyright  VHI. All rights reserved.

## 2017-06-10 ENCOUNTER — Ambulatory Visit (HOSPITAL_COMMUNITY): Payer: PRIVATE HEALTH INSURANCE

## 2017-06-10 ENCOUNTER — Ambulatory Visit (HOSPITAL_COMMUNITY): Payer: PRIVATE HEALTH INSURANCE | Admitting: Occupational Therapy

## 2017-06-15 ENCOUNTER — Telehealth (HOSPITAL_COMMUNITY): Payer: Self-pay

## 2017-06-15 ENCOUNTER — Ambulatory Visit (HOSPITAL_COMMUNITY): Payer: 59

## 2017-06-15 ENCOUNTER — Ambulatory Visit (HOSPITAL_COMMUNITY): Payer: PRIVATE HEALTH INSURANCE | Admitting: Physical Therapy

## 2017-06-15 NOTE — Telephone Encounter (Signed)
Patient is unable to come today,she need to take her grandson to the doctor

## 2017-06-17 ENCOUNTER — Ambulatory Visit (HOSPITAL_COMMUNITY): Payer: PRIVATE HEALTH INSURANCE | Attending: Orthopedic Surgery

## 2017-06-17 ENCOUNTER — Ambulatory Visit (HOSPITAL_COMMUNITY): Payer: 59 | Attending: Obstetrics & Gynecology | Admitting: Occupational Therapy

## 2017-06-17 DIAGNOSIS — R29898 Other symptoms and signs involving the musculoskeletal system: Secondary | ICD-10-CM | POA: Insufficient documentation

## 2017-06-17 DIAGNOSIS — R293 Abnormal posture: Secondary | ICD-10-CM | POA: Insufficient documentation

## 2017-06-17 DIAGNOSIS — M545 Low back pain: Secondary | ICD-10-CM | POA: Insufficient documentation

## 2017-06-17 DIAGNOSIS — M25511 Pain in right shoulder: Secondary | ICD-10-CM | POA: Diagnosis not present

## 2017-06-17 DIAGNOSIS — R262 Difficulty in walking, not elsewhere classified: Secondary | ICD-10-CM | POA: Diagnosis present

## 2017-06-17 DIAGNOSIS — G8929 Other chronic pain: Secondary | ICD-10-CM | POA: Diagnosis present

## 2017-06-17 DIAGNOSIS — M6281 Muscle weakness (generalized): Secondary | ICD-10-CM | POA: Insufficient documentation

## 2017-06-17 NOTE — Therapy (Addendum)
[place], [place], [address] Phone: [phone]   Fax:  [phone]  Occupational Therapy Treatment  Patient Details  Name: Melissa Blackburn MRN: [id number] Date of Birth: 20-Aug-1955 Referring Provider: Dr. Tania Ade   Encounter Date: 06/17/2017  OT End of Session - 06/17/17 1349    Visit Number  2    Number of Visits  16    Date for OT Re-Evaluation  08/04/17   mini reassessment 07/04/17   Authorization Type  [employer]    Authorization Time Period  $20 copay per visit    OT Start Time  1309  pt late due to road block   OT Stop Time  1345    OT Time Calculation (min)  36 min    Activity Tolerance  Patient tolerated treatment well    Behavior During Therapy  [facility] for tasks assessed/performed       Past Medical History:  Diagnosis Date  . Anemia    hx of  . Asthma    "adult onset" (09/11/2016)  . DDD (degenerative disc disease)    "hands, knees" (09/11/2016)  . Hypertension   . Migraine    "none since stimulator placed 07/2016" (09/11/2016)  . Needle stick injury with contaminated needle    patient undergoing testing  . Numbness and tingling 11/15/2012  . OSA on CPAP    cpap 7 (09/11/2016)  . Pain in limb 11/15/2012  . Paresthesias 11/15/2012  . Pneumonia 1980s X 1  . PONV (postoperative nausea and vomiting)    "severe nausea and vomiting"  . Weakness generalized 11/15/2012    Past Surgical History:  Procedure Laterality Date  . BACK SURGERY    . KNEE ARTHROSCOPY Bilateral    "bone fragments"  . KNEE ARTHROSCOPY W/ ACL RECONSTRUCTION Left 2009  . KNEE ARTHROSCOPY W/ MENISCAL REPAIR Left 1981  . KNEE ARTHROSCOPY W/ MENISCAL REPAIR Left 2012  . LUMBAR FUSION Left 09/11/2016   LUMBAR 4-5 TRANSFORAMINAL LUMBAR INTERBODY FUSION WITH INSTRUMENTATION AND Gertha Calkin Archie Endo 09/11/2016  . PERIPHERAL NERVE STIMULATOR Right 07/2016   "in my chest; to treat migraine headaches"  . RECTOCELE REPAIR    . SHOULDER  ARTHROSCOPY W/ SUPERIOR LABRAL ANTERIOR POSTERIOR REPAIR Bilateral    "I have rivets in both shoulders to hold them together" (09/11/2016)  . SHOULDER ARTHROSCOPY WITH SUBACROMIAL DECOMPRESSION Right 1994 X 2; 1995; ~ 2010  . VAGINAL HYSTERECTOMY  1986   "partial"    There were no vitals filed for this visit.  Subjective Assessment - 06/17/17 1347    Subjective   S: I've been trying to type and my shoulder is sore.     Currently in Pain?  Yes    Pain Score  3     Pain Location  Shoulder    Pain Orientation  Right    Pain Descriptors / Indicators  Aching;Sore    Pain Type  Acute pain    Pain Radiating Towards  to elbow    Pain Onset  More than a month ago    Pain Frequency  Constant    Aggravating Factors   movement    Pain Relieving Factors  rest, heat    Effect of Pain on Daily Activities  unable to use RUE for daily tasks    Multiple Pain Sites  No         [facility] OT Assessment - 06/17/17 1312      Assessment   Diagnosis  Right Rotator  Cuff Repair      Precautions   Precautions  Shoulder    Type of Shoulder Precautions  See protocol: Weeks 0-4 (10/11-11/8): Pendulums, P/ROM to tolerance, elbow/wrist A/ROM, scapular musculature isometrics. Week 5 (11/9-11/16): Continue P/ROM working to [employer], Initiate AA/ROM flexion in supine, prone rowing to neutral. Weeks 6-8 (11/16-11/30): AA/ROM progressing to A/ROM, shoulder stretching, rotator cuff isometrics. Week 10: Begin strengthening as tolerated.     Shoulder Interventions  Shoulder sling/immobilizer;At all times               OT Treatments/Exercises (OP) - 06/17/17 1335      Exercises   Exercises  Shoulder      Shoulder Exercises: Supine   Protraction  PROM;10 reps    Horizontal ABduction  PROM;10 reps    External Rotation  PROM;10 reps    Internal Rotation  PROM;10 reps    Flexion  PROM;10 reps    ABduction  PROM;10 reps      Shoulder Exercises: Seated   Other Seated Exercises  Scapular isometrics: elevation,  extension, row 10X 3" hold      Shoulder Exercises: Therapy Ball   Flexion  10 reps    ABduction  10 reps      Shoulder Exercises: ROM/Strengthening   Anterior Glide  3x10"      Manual Therapy   Manual Therapy  Myofascial release    Manual therapy comments  completed separately from therapeutic exercise    Myofascial Release  Myofascial release to right upper arm, trapezius, and scapularis regions to decrease pain and fascial restrictions and increase joint range of motion             OT Education - 06/17/17 1349    Education provided  Yes    Education Details  provided and reviewed evaluation    Person(s) Educated  Patient    Methods  Explanation;Handout    Comprehension  Verbalized understanding       OT Short Term Goals - 06/17/17 1749      OT SHORT TERM GOAL #1   Title  Pt will be provided with and educated on HEP to improve mobility in RUE for use as dominant during functional tasks.     Time  4    Period  Weeks    Status  On-going      OT SHORT TERM GOAL #2   Title  Pt will increase P/ROM of RUE to WNL to improve ability to donn shirts with greater ease.     Time  4    Period  Weeks    Status  On-going      OT SHORT TERM GOAL #3   Title  Pt will decrease RUE pain to 4/10 to improve ability to sleep.     Time  4    Period  Weeks    Status  On-going        OT Long Term Goals - 06/17/17 1750      OT LONG TERM GOAL #1   Title  Pt will return to highest level of functioning and independence in daily tasks using RUE as dominant.     Time  8    Period  Weeks    Status  On-going      OT LONG TERM GOAL #2   Title  Pt will decrease pain in RUE to 2/10 or less to improve ability to improve ability to perform school tasks on the computer with greater ease.  Time  8    Period  Weeks    Status  On-going      OT LONG TERM GOAL #3   Title  Pt will decrease fascial restrictions in RUE from max amounts to min amounts or less to improve mobility required for  functional reaching tasks.     Time  8    Period  Weeks    Status  On-going      OT LONG TERM GOAL #4   Title  Pt will improve RUE A/ROM to [facility] to improve ability to reach overhead when working or during school tasks.     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     Time  8    Period  Weeks    Status  On-going            Plan - 06/17/17 1349    Clinical Impression Statement  A: Initiated myofascial release, manual therapy, P/ROM, scapular isometrics, and therapy ball exercises. Verbal cuing for form and technique. Pt reports she is completing HEP, has increased soreness after typing for classwork.     Plan  P: Progress to phase II as able to tolerate    OT Home Exercise Plan  10/26: table slides       Patient will benefit from skilled therapeutic intervention in order to improve the following deficits and impairments:  Decreased activity tolerance, Decreased strength, Impaired flexibility, Decreased range of motion, Pain, Impaired UE functional use, Increased fascial restricitons  Visit Diagnosis: Acute pain of right shoulder  Other symptoms and signs involving the musculoskeletal system    Problem List Patient Active Problem List   Diagnosis Date Noted  . S/P shoulder surgery 05/21/2017  . S/P right rotator cuff repair 05/21/2017  . Radiculopathy 09/11/2016  . Weakness generalized 11/15/2012  . Paresthesias 11/15/2012  . Pain in limb 11/15/2012  . Numbness and tingling 11/15/2012   Guadelupe Sabin, OTR/L  [phone] 06/17/2017, 5:50 PM  [place] [address] [place], [place], [address] Phone: [phone]   Fax:  [phone]  Name: Melissa Blackburn MRN: [id number] Date of Birth: 1956/01/29

## 2017-06-17 NOTE — Therapy (Signed)
[place], [place], [address] Phone: [phone]   Fax:  [phone]  Physical Therapy Treatment  Patient Details  Name: Melissa Blackburn MRN: [id number] Date of Birth: July 21, 1956 Referring Provider: Phylliss Bob   Encounter Date: 06/17/2017  PT End of Session - 06/17/17 1419    Visit Number  2    Number of Visits  8    Date for PT Re-Evaluation  07/08/17    Authorization Type  work comp    Authorization - Visit Number  2    Authorization - Number of Visits  8    PT Start Time  1346    PT Stop Time  1428    PT Time Calculation (min)  42 min    Activity Tolerance  Patient tolerated treatment well    Behavior During Therapy  [facility] for tasks assessed/performed       Past Medical History:  Diagnosis Date  . Anemia    hx of  . Asthma    "adult onset" (09/11/2016)  . DDD (degenerative disc disease)    "hands, knees" (09/11/2016)  . Hypertension   . Migraine    "none since stimulator placed 07/2016" (09/11/2016)  . Needle stick injury with contaminated needle    patient undergoing testing  . Numbness and tingling 11/15/2012  . OSA on CPAP    cpap 7 (09/11/2016)  . Pain in limb 11/15/2012  . Paresthesias 11/15/2012  . Pneumonia 1980s X 1  . PONV (postoperative nausea and vomiting)    "severe nausea and vomiting"  . Weakness generalized 11/15/2012    Past Surgical History:  Procedure Laterality Date  . BACK SURGERY    . KNEE ARTHROSCOPY Bilateral    "bone fragments"  . KNEE ARTHROSCOPY W/ ACL RECONSTRUCTION Left 2009  . KNEE ARTHROSCOPY W/ MENISCAL REPAIR Left 1981  . KNEE ARTHROSCOPY W/ MENISCAL REPAIR Left 2012  . LUMBAR FUSION Left 09/11/2016   LUMBAR 4-5 TRANSFORAMINAL LUMBAR INTERBODY FUSION WITH INSTRUMENTATION AND Gertha Calkin Archie Endo 09/11/2016  . PERIPHERAL NERVE STIMULATOR Right 07/2016   "in my chest; to treat migraine headaches"  . RECTOCELE REPAIR    . SHOULDER ARTHROSCOPY W/ SUPERIOR LABRAL ANTERIOR  POSTERIOR REPAIR Bilateral    "I have rivets in both shoulders to hold them together" (09/11/2016)  . SHOULDER ARTHROSCOPY WITH SUBACROMIAL DECOMPRESSION Right 1994 X 2; 1995; ~ 2010  . VAGINAL HYSTERECTOMY  1986   "partial"    There were no vitals filed for this visit.  Subjective Assessment - 06/17/17 1351    Subjective  Pt reports she is feeling good today (some Rt shoulder pain from surgery) and point tenderness at the left trochanter. Patient reports HEP set up last visit is adequate for time adn effort, clear as can be.     Pertinent History  5 Rt shoulder surgeries, SI injection, DDD, permenant nerve stimulator , knee surgery     Currently in Pain?  No/denies just left trochanteric soress, no real pain   just left trochanteric soress, no real pain                     [facility] Adult PT Treatment/Exercise - 06/17/17 1408      Ambulation/Gait   Ambulation Distance (Feet)  450 Feet    Gait velocity  1.75m/s mildly abducted left LE   mildly abducted left LE   Gait Comments  RUE sling      Lumbar Exercises: Standing  Heel Raises  20 reps 1x20   1x20   Other Standing Lumbar Exercises  SLS: 10sec on right, 3sec on Left no increase in trochanteric pain.    no increase in trochanteric pain.      Lumbar Exercises: Seated   Sit to Stand  -- 1x8 from 14inch box, education on hip knee/trunk form   1x8 from 14inch box, education on hip knee/trunk form     Manual Therapy   Myofascial Release  Left anterio and lateral glute med/ minimus: 5 minutes               PT Short Term Goals - 06/08/17 1439      PT SHORT TERM GOAL #1   Title  Pt to be I in HEP to increase LE strength to allow pt to come from sit to stand with ease from low lying couches.     Time  2    Period  Weeks    Status  New    Target Date  06/22/17      PT SHORT TERM GOAL #2   Title  Pt to be able to ascend and descend two flights of steps without increased back pain.     Time  2    Period   Weeks    Status  New        PT Long Term Goals - 06/08/17 1441      PT LONG TERM GOAL #1   Title  Pt to be able to tolerate shopping for 2 hours without experiencing increased back pain     Time  4    Period  Weeks    Status  New    Target Date  07/06/17      PT LONG TERM GOAL #2   Title  PT to be able to walk on uneven terrain for hunting and fishing for two hours without increased back pain.  (Pt will not be able to physically complete these activities secondary to Rt RTC surgery).    Time  4    Period  Weeks    Status  New      PT LONG TERM GOAL #3   Title  Pt leg strength increased to where pt is able to get in and out of a bathtub without difficulty    Time  4    Period  Weeks    Status  New      PT LONG TERM GOAL #4   Title  Pt to state that she has not experienced low back pain greater than a 1/10 for the past week     Time  4    Period  Weeks    Status  New            Plan - 06/17/17 1419    Clinical Impression Statement  Educaiton on gait correction, ankle strengthening, and. progressed squats to 14" height surface with heavy education on form. Noted weakness n ankles and hips, limited gait stability and SLS. No increase in trochanteric pain after 30sec SLS. Making good progress toward goals overall. MFR to left lateral/anterior glute med positive recreation of trochaneteric symptoms. Asked pt to measure chair height at home adn progress STS to lower surface    Rehab Potential  Good    PT Frequency  2x / week    PT Duration  4 weeks    PT Treatment/Interventions  ADLs/Self Care Home Management;Stair training;Functional mobility training;Therapeutic activities;Therapeutic exercise;Ultrasound;Moist Heat;Iontophoresis 4mg /ml Dexamethasone;Patient/family education;Manual  techniques    PT Next Visit Plan  Begin lunges, step ups , lateral step ups, wall slides, LTR and hamstring stretches.    PT Home Exercise Plan  EVAL:  functional squat, heel raises and sit to  stands.        Patient will benefit from skilled therapeutic intervention in order to improve the following deficits and impairments:  Decreased activity tolerance, Decreased strength, Difficulty walking, Pain  Visit Diagnosis: Chronic bilateral low back pain without sciatica  Abnormal posture  Difficulty in walking, not elsewhere classified  Muscle weakness (generalized)     Problem List Patient Active Problem List   Diagnosis Date Noted  . S/P shoulder surgery 05/21/2017  . S/P right rotator cuff repair 05/21/2017  . Radiculopathy 09/11/2016  . Weakness generalized 11/15/2012  . Paresthesias 11/15/2012  . Pain in limb 11/15/2012  . Numbness and tingling 11/15/2012    2:32 PM, 06/17/17 Etta Grandchild, PT, DPT Physical Therapist at [place] [phone] (office)      Etta Grandchild 06/17/2017, 2:31 PM  [place] [address] [place], [place], [address] Phone: [phone]   Fax:  [phone]  Name: Melissa Blackburn MRN: [id number] Date of Birth: 1955/09/03

## 2017-06-22 ENCOUNTER — Encounter (HOSPITAL_COMMUNITY): Payer: Self-pay

## 2017-06-22 ENCOUNTER — Encounter (HOSPITAL_COMMUNITY): Payer: Self-pay | Admitting: Physical Therapy

## 2017-06-22 ENCOUNTER — Ambulatory Visit (HOSPITAL_COMMUNITY): Payer: 59

## 2017-06-22 ENCOUNTER — Ambulatory Visit (HOSPITAL_COMMUNITY): Payer: PRIVATE HEALTH INSURANCE | Admitting: Physical Therapy

## 2017-06-22 ENCOUNTER — Other Ambulatory Visit: Payer: Self-pay

## 2017-06-22 DIAGNOSIS — R262 Difficulty in walking, not elsewhere classified: Secondary | ICD-10-CM

## 2017-06-22 DIAGNOSIS — R29898 Other symptoms and signs involving the musculoskeletal system: Secondary | ICD-10-CM | POA: Diagnosis not present

## 2017-06-22 DIAGNOSIS — M25511 Pain in right shoulder: Secondary | ICD-10-CM | POA: Diagnosis not present

## 2017-06-22 DIAGNOSIS — M545 Low back pain, unspecified: Secondary | ICD-10-CM

## 2017-06-22 DIAGNOSIS — G8929 Other chronic pain: Secondary | ICD-10-CM

## 2017-06-22 DIAGNOSIS — M6281 Muscle weakness (generalized): Secondary | ICD-10-CM

## 2017-06-22 NOTE — Therapy (Signed)
[place], [place], [address] Phone: [phone]   Fax:  [phone]  Occupational Therapy Treatment  Patient Details  Name: Melissa Blackburn MRN: [id number] Date of Birth: 1955-09-17 Referring Provider: Dr. Tania Ade   Encounter Date: 06/22/2017  OT End of Session - 06/22/17 1333    Visit Number  3    Number of Visits  16    Date for OT Re-Evaluation  08/04/17 mini reassessment 07/04/17    Authorization Type  [facility]    Authorization Time Period  $20 copay per visit    OT Start Time  1300    OT Stop Time  1345    OT Time Calculation (min)  45 min    Activity Tolerance  Patient tolerated treatment well    Behavior During Therapy  [facility] for tasks assessed/performed       Past Medical History:  Diagnosis Date  . Anemia    hx of  . Asthma    "adult onset" (09/11/2016)  . DDD (degenerative disc disease)    "hands, knees" (09/11/2016)  . Hypertension   . Migraine    "none since stimulator placed 07/2016" (09/11/2016)  . Needle stick injury with contaminated needle    patient undergoing testing  . Numbness and tingling 11/15/2012  . OSA on CPAP    cpap 7 (09/11/2016)  . Pain in limb 11/15/2012  . Paresthesias 11/15/2012  . Pneumonia 1980s X 1  . PONV (postoperative nausea and vomiting)    "severe nausea and vomiting"  . Weakness generalized 11/15/2012    Past Surgical History:  Procedure Laterality Date  . BACK SURGERY    . KNEE ARTHROSCOPY Bilateral    "bone fragments"  . KNEE ARTHROSCOPY W/ ACL RECONSTRUCTION Left 2009  . KNEE ARTHROSCOPY W/ MENISCAL REPAIR Left 1981  . KNEE ARTHROSCOPY W/ MENISCAL REPAIR Left 2012  . LUMBAR FUSION Left 09/11/2016   LUMBAR 4-5 TRANSFORAMINAL LUMBAR INTERBODY FUSION WITH INSTRUMENTATION AND Gertha Calkin Archie Endo 09/11/2016  . PERIPHERAL NERVE STIMULATOR Right 07/2016   "in my chest; to treat migraine headaches"  . RECTOCELE REPAIR    . SHOULDER ARTHROSCOPY W/ SUPERIOR LABRAL  ANTERIOR POSTERIOR REPAIR Bilateral    "I have rivets in both shoulders to hold them together" (09/11/2016)  . SHOULDER ARTHROSCOPY WITH SUBACROMIAL DECOMPRESSION Right 1994 X 2; 1995; ~ 2010  . VAGINAL HYSTERECTOMY  1986   "partial"    There were no vitals filed for this visit.  Subjective Assessment - 06/22/17 1331    Subjective   S: It feels like a jello arm.    Currently in Pain?  Yes    Pain Score  4     Pain Location  Shoulder    Pain Orientation  Right    Pain Descriptors / Indicators  Aching;Constant;Sore    Pain Type  Acute pain         [facility] OT Assessment - 06/22/17 1331      Assessment   Diagnosis  Right Rotator Cuff Repair      Precautions   Precautions  Shoulder    Type of Shoulder Precautions  See protocol: Weeks 0-4 (10/11-11/8): Pendulums, P/ROM to tolerance, elbow/wrist A/ROM, scapular musculature isometrics. Week 5 (11/9-11/16): Continue P/ROM working to [employer], Initiate AA/ROM flexion in supine, prone rowing to neutral. Weeks 6-8 (11/16-11/30): AA/ROM progressing to A/ROM, shoulder stretching, rotator cuff isometrics. Week 10: Begin strengthening as tolerated.     Shoulder Interventions  Shoulder sling/immobilizer;At all times  OT Treatments/Exercises (OP) - 06/22/17 1332      Exercises   Exercises  Shoulder      Shoulder Exercises: Supine   Protraction  PROM;10 reps    Horizontal ABduction  PROM;10 reps    External Rotation  PROM;10 reps    Internal Rotation  PROM;10 reps    Flexion  PROM;AAROM;10 reps    ABduction  PROM;10 reps      Shoulder Exercises: Seated   Other Seated Exercises  Scapular isometrics:  extension, row 10X 3" hold      Shoulder Exercises: Prone   Other Prone Exercises  Row to neutral; 10X      Shoulder Exercises: Therapy Ball   Flexion  -- 12X    ABduction  -- 12X      Shoulder Exercises: ROM/Strengthening   Thumb Tacks  low level 1'    Anterior Glide  3x10"      Manual Therapy   Manual Therapy   Myofascial release    Manual therapy comments  completed separately from therapeutic exercise    Myofascial Release  Myofascial release and manual stretching completed to right upper arm, trapezius, and scapularis region to decreased fascial restrictions and increase joint mobility in a pain free zone.               OT Short Term Goals - 06/17/17 1749      OT SHORT TERM GOAL #1   Title  Pt will be provided with and educated on HEP to improve mobility in RUE for use as dominant during functional tasks.     Time  4    Period  Weeks    Status  On-going      OT SHORT TERM GOAL #2   Title  Pt will increase P/ROM of RUE to WNL to improve ability to donn shirts with greater ease.     Time  4    Period  Weeks    Status  On-going      OT SHORT TERM GOAL #3   Title  Pt will decrease RUE pain to 4/10 to improve ability to sleep.     Time  4    Period  Weeks    Status  On-going        OT Long Term Goals - 06/17/17 1750      OT LONG TERM GOAL #1   Title  Pt will return to highest level of functioning and independence in daily tasks using RUE as dominant.     Time  8    Period  Weeks    Status  On-going      OT LONG TERM GOAL #2   Title  Pt will decrease pain in RUE to 2/10 or less to improve ability to improve ability to perform school tasks on the computer with greater ease.     Time  8    Period  Weeks    Status  On-going      OT LONG TERM GOAL #3   Title  Pt will decrease fascial restrictions in RUE from max amounts to min amounts or less to improve mobility required for functional reaching tasks.     Time  8    Period  Weeks    Status  On-going      OT LONG TERM GOAL #4   Title  Pt will improve RUE A/ROM to [facility] to improve ability to reach overhead when working or during school tasks.  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      Time  8    Period  Weeks    Status  On-going            Plan - 06/22/17 1339    Clinical Impression Statement  A: Pt was able to progress to AA/ROM flexion supine and prone row per protocol. Pt with increased fascial restrictions in right upper arm although does respond well to myofascial release.     Plan  P: continue with phase II of protocol. Increase hold for scapular isometrics       Patient will benefit from skilled therapeutic intervention in order to improve the following deficits and impairments:  Decreased activity tolerance, Decreased strength, Impaired flexibility, Decreased range of motion, Pain, Impaired UE functional use, Increased fascial restricitons  Visit Diagnosis: Acute pain of right shoulder  Other symptoms and signs involving the musculoskeletal system    Problem List Patient Active Problem List   Diagnosis Date Noted  . S/P shoulder surgery 05/21/2017  . S/P right rotator cuff repair 05/21/2017  . Radiculopathy 09/11/2016  . Weakness generalized 11/15/2012  . Paresthesias 11/15/2012  . Pain in limb 11/15/2012  . Numbness and tingling 11/15/2012   Ailene Ravel, OTR/L,CBIS  [phone]  06/22/2017, 3:10 PM  [place] [address] [place], [place], [address] Phone: [phone]   Fax:  [phone]  Name: Melissa Blackburn MRN: [id number] Date of Birth: [birth year]

## 2017-06-22 NOTE — Therapy (Addendum)
[place], [place], [address] Phone: [phone]   Fax:  [phone]  Physical Therapy Treatment  Patient Details  Name: Melissa Blackburn MRN: [id number] Date of Birth: June 29, 1956 Referring Provider: Phylliss Bob   Encounter Date: 06/22/2017  PT End of Session - 06/22/17 1418    Visit Number  3    Number of Visits  3   Date for PT Re-Evaluation  07/08/17    Authorization Type  work comp    Authorization - Visit Number  3    Authorization - Number of Visits  3    PT Start Time  1343    PT Stop Time  1425    PT Time Calculation (min)  42 min    Activity Tolerance  Patient tolerated treatment well    Behavior During Therapy  [facility] for tasks assessed/performed       Past Medical History:  Diagnosis Date  . Anemia    hx of  . Asthma    "adult onset" (09/11/2016)  . DDD (degenerative disc disease)    "hands, knees" (09/11/2016)  . Hypertension   . Migraine    "none since stimulator placed 07/2016" (09/11/2016)  . Needle stick injury with contaminated needle    patient undergoing testing  . Numbness and tingling 11/15/2012  . OSA on CPAP    cpap 7 (09/11/2016)  . Pain in limb 11/15/2012  . Paresthesias 11/15/2012  . Pneumonia 1980s X 1  . PONV (postoperative nausea and vomiting)    "severe nausea and vomiting"  . Weakness generalized 11/15/2012    Past Surgical History:  Procedure Laterality Date  . BACK SURGERY    . KNEE ARTHROSCOPY Bilateral    "bone fragments"  . KNEE ARTHROSCOPY W/ ACL RECONSTRUCTION Left 2009  . KNEE ARTHROSCOPY W/ MENISCAL REPAIR Left 1981  . KNEE ARTHROSCOPY W/ MENISCAL REPAIR Left 2012  . LUMBAR FUSION Left 09/11/2016   LUMBAR 4-5 TRANSFORAMINAL LUMBAR INTERBODY FUSION WITH INSTRUMENTATION AND Gertha Calkin Archie Endo 09/11/2016  . PERIPHERAL NERVE STIMULATOR Right 07/2016   "in my chest; to treat migraine headaches"  . RECTOCELE REPAIR    . SHOULDER ARTHROSCOPY W/ SUPERIOR LABRAL ANTERIOR  POSTERIOR REPAIR Bilateral    "I have rivets in both shoulders to hold them together" (09/11/2016)  . SHOULDER ARTHROSCOPY WITH SUBACROMIAL DECOMPRESSION Right 1994 X 2; 1995; ~ 2010  . VAGINAL HYSTERECTOMY  1986   "partial"    There were no vitals filed for this visit.  Subjective Assessment - 06/22/17 1337    Subjective  Pt states that her back is fine but her hip where the therapist did alot of pressure point if very sore.      Pertinent History  5 Rt shoulder surgeries, SI injection, DDD, permenant nerve stimulator , knee surgery     Currently in Pain?  Yes    Pain Location  Hip    Pain Orientation  Right    Pain Descriptors / Indicators  Sore    Pain Type  Acute pain    Pain Frequency  Constant                      [facility] Adult PT Treatment/Exercise - 06/22/17 1349      Exercises   Exercises  Lumbar      Lumbar Exercises: Stretches   Passive Hamstring Stretch  3 reps;30 seconds    Passive Hamstring Stretch Limitations  12" step  Lower Trunk Rotation  5 reps    Standing Side Bend  5 reps    Standing Extension  5 reps      Lumbar Exercises: Standing   Heel Raises  15 reps combo with squats     Functional Squats  15 reps    Forward Lunge  15 reps    Other Standing Lumbar Exercises  SLS     Other Standing Lumbar Exercises  side step with blue t-band x 2 RT       Lumbar Exercises: Seated   Sit to Stand  10 reps      Lumbar Exercises: Supine   Bridge  20 reps      Lumbar Exercises: Sidelying   Hip Abduction  15 reps;Limitations Rt Le     Hip Abduction Weights (lbs)  Rt LT;  LT done standing isometrically against the wall                PT Short Term Goals - 06/08/17 1439      PT SHORT TERM GOAL #1   Title  Pt to be I in HEP to increase LE strength to allow pt to come from sit to stand with ease from low lying couches.     Time  2    Period  Weeks    Status Achieved    Target Date  06/22/17      PT SHORT TERM GOAL #2   Title  Pt to be  able to ascend and descend two flights of steps without increased back pain.     Time  2    Period  Weeks    Status  Achieved        PT Long Term Goals - 06/08/17 1441      PT LONG TERM GOAL #1   Title  Pt to be able to tolerate shopping for 2 hours without experiencing increased back pain     Time  4    Period  Weeks    Status  Achieved    Target Date  07/06/17      PT LONG TERM GOAL #2   Title  PT to be able to walk on uneven terrain for hunting and fishing for two hours without increased back pain.  (Pt will not be able to physically complete these activities secondary to Rt RTC surgery).    Time  4    Period  Weeks    Status  ongoing      PT LONG TERM GOAL #3   Title  Pt leg strength increased to where pt is able to get in and out of a bathtub without difficulty    Time  4    Period  Weeks    Status Ongoing      PT LONG TERM GOAL #4   Title  Pt to state that she has not experienced low back pain greater than a 1/10 for the past week     Time  4    Period  Weeks    Status  achieved            Plan - 06/22/17 1419    Clinical Impression Statement  Pt was not having soreness until manual was completed last session therefore we will limit manual.  Pt states ice feels better than heat on her hip encouraged pt to continue to ice.      Rehab Potential  Good    PT Frequency  2x / week  PT Duration  4 weeks    PT Treatment/Interventions  ADLs/Self Care Home Management;Stair training;Functional mobility training;Therapeutic activities;Therapeutic exercise;Ultrasound;Moist Heat;Iontophoresis '4mg'$ /ml Dexamethasone;Patient/family education;Manual techniques    PT Next Visit Plan   , lateral step ups,and wall slides,     PT Home Exercise Plan  EVAL:  functional squat, heel raises and sit to stands.        Patient will benefit from skilled therapeutic intervention in order to improve the following deficits and impairments:  Decreased activity tolerance, Decreased strength,  Difficulty walking, Pain  Visit Diagnosis: Difficulty in walking, not elsewhere classified  Muscle weakness (generalized)  Chronic bilateral low back pain without sciatica     Problem List Patient Active Problem List   Diagnosis Date Noted  . S/P shoulder surgery 05/21/2017  . S/P right rotator cuff repair 05/21/2017  . Radiculopathy 09/11/2016  . Weakness generalized 11/15/2012  . Paresthesias 11/15/2012  . Pain in limb 11/15/2012  . Numbness and tingling 11/15/2012   Rayetta Humphrey, PT CLT [phone] 06/22/2017, 2:30 PM  [facility] [address] [place], [place], [address] Phone: [phone]   Fax:  [phone]  Name: MECHEL HAGGARD MRN: [id number] Date of Birth: 1956-07-19  PHYSICAL THERAPY DISCHARGE SUMMARY  Visits from Start of Care: 3  Current functional level related to goals / functional outcomes: Pt did not return for future visits.  Returned to this center for her rotator cuff rehab and told the therapist that her back Was fine now and she would not need any more therapy on her back.   Remaining deficits: Due to recent rotator cuff repair not back pain    Education / Equipment: HEP Plan: Patient agrees to discharge.  Patient goals were partially met. Patient is being discharged due to being pleased with the current functional level.  ?????        Rayetta Humphrey, [place] CLT [phone]

## 2017-06-24 ENCOUNTER — Ambulatory Visit (HOSPITAL_COMMUNITY): Payer: 59 | Admitting: Occupational Therapy

## 2017-06-24 ENCOUNTER — Ambulatory Visit (HOSPITAL_COMMUNITY): Payer: 59 | Admitting: Physical Therapy

## 2017-06-28 IMAGING — XA Imaging study
2 series · 2 of 2 positions shown · non-contrast
Comparison: none

CLINICAL DATA: Lumbosacral spondylosis without myelopathy. Low back
pain and right greater than left lower extremity pain. 60% relief
after the prior injection, with pain worsening again recently.

[Series 1: ortho standard · 1 of 1 slices shown (1 of 2)]
[im 1/1]
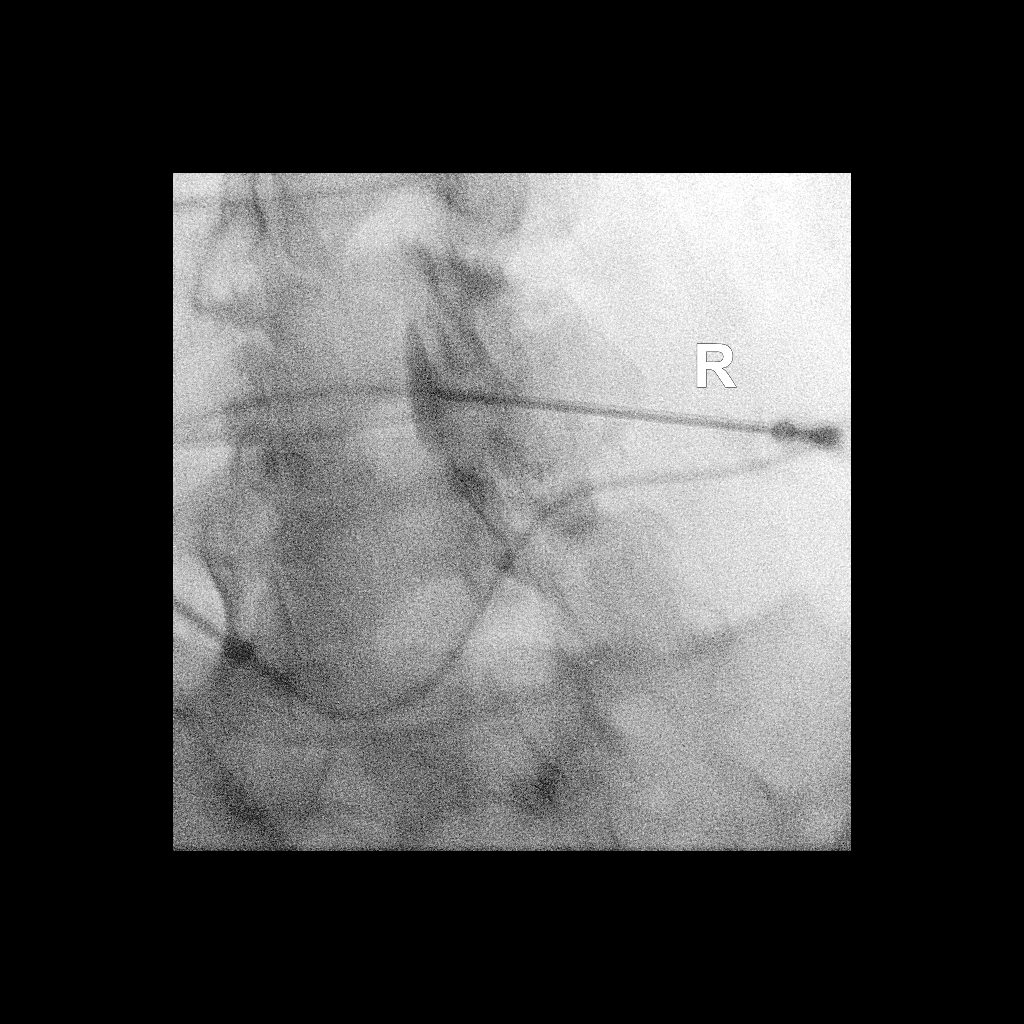

[Series 2: ortho standard · 1 of 1 slices shown (2 of 2)]
[im 1/1]
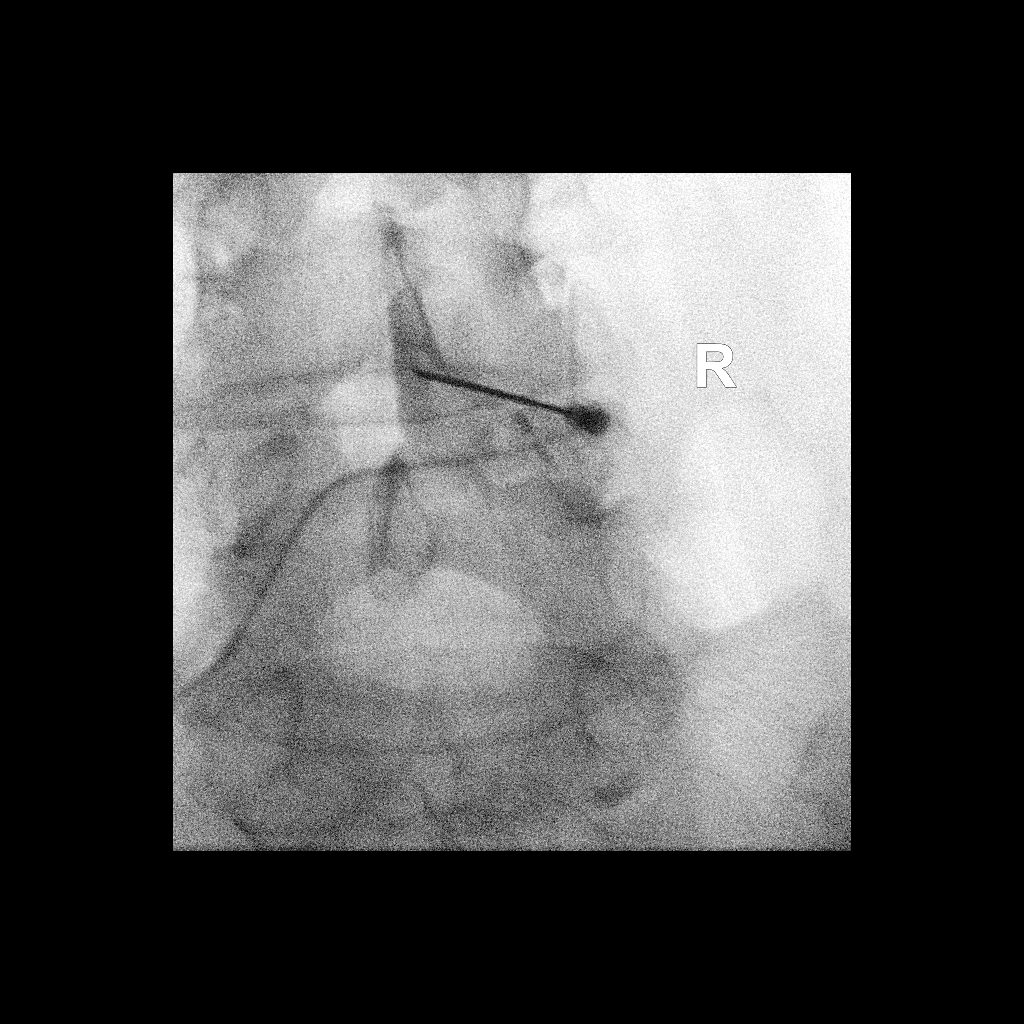

[2 of 2 positions shown; findings below may reference images not displayed]

FLUOROSCOPY TIME:  Radiation Exposure Index (as provided by the
fluoroscopic device): 11.43 microGray*m^2

Fluoroscopy Time (in minutes and seconds):  5 seconds

PROCEDURE:
The procedure, risks, benefits, and alternatives were explained to
the patient. Questions regarding the procedure were encouraged and
answered. The patient understands and consents to the procedure.

LUMBAR EPIDURAL INJECTION:

An interlaminar approach was performed on the right at L4-5. The
overlying skin was cleansed and anesthetized. A 3.5 inch 20 gauge
epidural needle was advanced using loss-of-resistance technique.

DIAGNOSTIC EPIDURAL INJECTION:

Injection of Isovue-M 200 shows a good epidural pattern with spread
above and below the level of needle placement, primarily on the
right. No vascular opacification is seen.

THERAPEUTIC EPIDURAL INJECTION:

120 mg of Depo-Medrol mixed with 3 mL of 1% lidocaine were
instilled. The procedure was well-tolerated, and the patient was
discharged thirty minutes following the injection in good condition.

COMPLICATIONS:
None
IMPRESSION: Technically successful lumbar interlaminar epidural injection on the
right at L4-5.

## 2017-07-01 ENCOUNTER — Encounter (HOSPITAL_COMMUNITY): Payer: Self-pay | Admitting: Orthopedic Surgery

## 2017-07-01 NOTE — OR Nursing (Signed)
Late entry 2 PEEK anchors entered 11/21 @ 1642.  AG

## 2017-07-08 DIAGNOSIS — Z9889 Other specified postprocedural states: Secondary | ICD-10-CM | POA: Diagnosis not present

## 2017-07-20 ENCOUNTER — Ambulatory Visit (HOSPITAL_COMMUNITY): Payer: 59 | Attending: Orthopedic Surgery

## 2017-07-20 DIAGNOSIS — R29898 Other symptoms and signs involving the musculoskeletal system: Secondary | ICD-10-CM | POA: Insufficient documentation

## 2017-07-20 DIAGNOSIS — M25511 Pain in right shoulder: Secondary | ICD-10-CM | POA: Insufficient documentation

## 2017-07-21 ENCOUNTER — Telehealth (HOSPITAL_COMMUNITY): Payer: Self-pay | Admitting: Internal Medicine

## 2017-07-21 NOTE — Telephone Encounter (Signed)
07/21/17  Left a message to try and reschedule from 12/10

## 2017-07-22 MED FILL — AMITRIPTYLINE HCL 50 MG TAB: 50 | 90 days supply | Qty: 90 | Fill #1 | Status: TO

## 2017-07-28 ENCOUNTER — Encounter (HOSPITAL_COMMUNITY): Payer: Self-pay

## 2017-07-28 ENCOUNTER — Ambulatory Visit (HOSPITAL_COMMUNITY): Payer: 59

## 2017-07-28 DIAGNOSIS — M25511 Pain in right shoulder: Secondary | ICD-10-CM

## 2017-07-28 DIAGNOSIS — R29898 Other symptoms and signs involving the musculoskeletal system: Secondary | ICD-10-CM | POA: Diagnosis not present

## 2017-07-28 NOTE — Therapy (Signed)
[place], [place], [address] Phone: [phone]   Fax:  [phone]  Occupational Therapy Treatment And reassessment Patient Details  Name: Melissa Blackburn MRN: [id number] Date of Birth: 07-11-1956 Referring Provider (Historical): Dr. Tania Ade   Encounter Date: 07/28/2017  OT End of Session - 07/28/17 1645    Visit Number  4    Number of Visits  16    Date for OT Re-Evaluation  08/27/17    Authorization Type  UMR    Authorization Time Period  $20 copay per visit    OT Start Time  1435    OT Stop Time  1515    OT Time Calculation (min)  40 min    Activity Tolerance  Patient tolerated treatment well    Behavior During Therapy  [facility] for tasks assessed/performed       Past Medical History:  Diagnosis Date  . Anemia    hx of  . Asthma    "adult onset" (09/11/2016)  . DDD (degenerative disc disease)    "hands, knees" (09/11/2016)  . Hypertension   . Migraine    "none since stimulator placed 07/2016" (09/11/2016)  . Needle stick injury with contaminated needle    patient undergoing testing  . Numbness and tingling 11/15/2012  . OSA on CPAP    cpap 7 (09/11/2016)  . Pain in limb 11/15/2012  . Paresthesias 11/15/2012  . Pneumonia 1980s X 1  . PONV (postoperative nausea and vomiting)    "severe nausea and vomiting"  . Weakness generalized 11/15/2012    Past Surgical History:  Procedure Laterality Date  . BACK SURGERY    . ESOPHAGOGASTRODUODENOSCOPY (EGD) WITH PROPOFOL N/A 01/10/2016   Procedure: ESOPHAGOGASTRODUODENOSCOPY (EGD) WITH PROPOFOL;  Surgeon: Milus Banister, MD;  Location: [facility];  Service: Endoscopy;  Laterality: N/A;  . KNEE ARTHROSCOPY  04/23/2012   Procedure: ARTHROSCOPY KNEE;  Surgeon: Augustin Schooling, MD;  Location: [place];  Service: Orthopedics;  Laterality: Left;  lateral meniscectomy  . KNEE ARTHROSCOPY Bilateral    "bone fragments"  . KNEE ARTHROSCOPY W/ ACL RECONSTRUCTION Left 2009  .  KNEE ARTHROSCOPY W/ MENISCAL REPAIR Left 1981  . KNEE ARTHROSCOPY W/ MENISCAL REPAIR Left 2012  . LUMBAR FUSION Left 09/11/2016   LUMBAR 4-5 TRANSFORAMINAL LUMBAR INTERBODY FUSION WITH INSTRUMENTATION AND Gertha Calkin Archie Endo 09/11/2016  . PERIPHERAL NERVE STIMULATOR Right 07/2016   "in my chest; to treat migraine headaches"  . RECTOCELE REPAIR    . SACROILIAC JOINT FUSION Left 04/09/2017   Procedure: LEFT SIDED SACROILIAC JOINT FUSION;  Surgeon: Phylliss Bob, MD;  Location: [place];  Service: Orthopedics;  Laterality: Left;  . SHOULDER ARTHROSCOPY W/ SUPERIOR LABRAL ANTERIOR POSTERIOR REPAIR Bilateral    "I have rivets in both shoulders to hold them together" (09/11/2016)  . SHOULDER ARTHROSCOPY WITH ROTATOR CUFF REPAIR AND SUBACROMIAL DECOMPRESSION Right 05/21/2017   Procedure: RIGHT SHOULDER ARTHROSCOPY WITH ROTATOR CUFF REPAIR VS DEBRIDEMENT, SUBACROMIAL DECOMPRESSION, DISTAL CLAVICAL EXCISION;  Surgeon: Tania Ade, MD;  Location: [place];  Service: Orthopedics;  Laterality: Right;  SHOULDER ARTHROSCOPY WITH ROTATOR CUFF REPAIR VS DEBRIDEMENT, SUBACROMIAL DECOMPRESSION, DISTAL CLAVICAL EXCISION  . SHOULDER ARTHROSCOPY WITH SUBACROMIAL DECOMPRESSION Right 1994 X 2; 1995; ~ 2010  . UPPER ESOPHAGEAL ENDOSCOPIC ULTRASOUND (EUS)  01/10/2016   Procedure: UPPER ESOPHAGEAL ENDOSCOPIC ULTRASOUND (EUS);  Surgeon: Milus Banister, MD;  Location: [facility];  Service: Endoscopy;;  . VAGINAL HYSTERECTOMY  1986   "partial"    There were  no vitals filed for this visit.  Subjective Assessment - 07/28/17 1642    Subjective   S: I've been doing good overall. My strength is still a little shakey.    Special Tests  FOTO score: 59/100    Currently in Pain?  Yes    Pain Score  2     Pain Location  Shoulder    Pain Orientation  Right    Pain Descriptors / Indicators  Aching;Sore    Pain Type  Acute pain    Pain Radiating Towards  N/A    Pain Onset  More than a month ago    Pain Frequency  Constant     Aggravating Factors   certain movements    Pain Relieving Factors  rest, heat    Effect of Pain on Daily Activities  min effect    Multiple Pain Sites  No         [facility] OT Assessment - 07/28/17 1439      Assessment   Medical Diagnosis  Right Rotator Cuff Repair    Referring Provider  Dr. Tania Ade    Onset Date/Surgical Date  05/21/17    Next MD Visit  08/19/17      Precautions   Precautions  Shoulder    Type of Shoulder Precautions  No lifting over 5lbs. Progress as tolerated.      Prior Function   Level of Independence  Independent      ROM / Strength   AROM / PROM / Strength  AROM;PROM;Strength      AROM   Overall AROM Comments  Assessed seated. IR/er abducted.    AROM Assessment Site  Shoulder    Right/Left Shoulder  Right    Right Shoulder Flexion  150 Degrees    Right Shoulder ABduction  166 Degrees    Right Shoulder Internal Rotation  54 Degrees    Right Shoulder External Rotation  90 Degrees      PROM   Overall PROM   Within functional limits for tasks performed    Overall PROM Comments  Shoulder all ranges.      Strength   Overall Strength Comments  Assessed Seated. IR/er abducted    Strength Assessment Site  Shoulder    Right/Left Shoulder  Right    Right Shoulder Flexion  5/5    Right Shoulder ABduction  5/5    Right Shoulder Internal Rotation  4/5    Right Shoulder External Rotation  4/5               OT Treatments/Exercises (OP) - 07/28/17 1644      Exercises   Exercises  Shoulder      Shoulder Exercises: Standing   Horizontal ABduction  Theraband;10 reps    Theraband Level (Shoulder Horizontal ABduction)  Level 2 (Red)    External Rotation  Theraband;10 reps towel    Extension  Theraband;10 reps    Theraband Level (Shoulder Extension)  Level 2 (Red)    Row  Theraband;10 reps    Theraband Level (Shoulder Row)  Level 2 (Red)    Retraction  Theraband;10 reps    Theraband Level (Shoulder Retraction)  Level 2 (Red)      Manual  Therapy   Manual Therapy  Myofascial release    Manual therapy comments  completed separately from therapeutic exercise    Myofascial Release  Myofascial release and manual stretching completed to right upper arm, trapezius, and scapularis region to decreased fascial restrictions and increase joint  mobility in a pain free zone.             OT Education - 07/28/17 1645    Education provided  Yes    Education Details  red theraband scapular and shoulder strengthening exercises    Person(s) Educated  Patient    Methods  Explanation;Demonstration;Verbal cues;Handout    Comprehension  Returned demonstration;Verbalized understanding       OT Short Term Goals - 07/28/17 1646      OT SHORT TERM GOAL #1   Title  Pt will be provided with and educated on HEP to improve mobility in RUE for use as dominant during functional tasks.     Time  4    Period  Weeks    Status  Achieved      OT SHORT TERM GOAL #2   Title  Pt will increase P/ROM of RUE to WNL to improve ability to donn shirts with greater ease.     Time  4    Period  Weeks    Status  Achieved      OT SHORT TERM GOAL #3   Title  Pt will decrease RUE pain to 4/10 to improve ability to sleep.     Time  4    Period  Weeks    Status  Achieved        OT Long Term Goals - 07/28/17 1646      OT LONG TERM GOAL #1   Title  Pt will return to highest level of functioning and independence in daily tasks using RUE as dominant.     Time  8    Period  Weeks    Status  On-going    Target Date  08/27/17      OT LONG TERM GOAL #2   Title  Pt will decrease pain in RUE to 2/10 or less to improve ability to improve ability to perform school tasks on the computer with greater ease.     Time  8    Period  Weeks    Status  Achieved      OT LONG TERM GOAL #3   Title  Pt will decrease fascial restrictions in RUE from max amounts to min amounts or less to improve mobility required for functional reaching tasks.     Time  8    Period   Weeks    Status  Achieved      OT LONG TERM GOAL #4   Title  Pt will improve RUE A/ROM to [facility] to improve ability to reach overhead when working or during school tasks.     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     Time  8    Period  Weeks    Status  On-going            Plan - 07/28/17 1647    Clinical Impression Statement  A: Reassessment completed this date as patient has not attended therapy for approximately one month. Patient overall has done very well in her absence. She has met all short term goals and 3/5 long term goals. It is recommended that she attend therapy for 4 more weeks to focus mainly on strength and shoulder stability in order for her to return  to work. HEP was updated.     OT Frequency  2x / week    OT Duration  4 weeks    Plan  P: Focus on strengthening of RUE. Patient is able to progress as tolerated. At MD appointment last month he advised her to remain at 5# or less.     Consulted and Agree with Plan of Care  Patient       Patient will benefit from skilled therapeutic intervention in order to improve the following deficits and impairments:  Decreased activity tolerance, Decreased strength, Pain, Increased fascial restrictions, Decreased range of motion  Visit Diagnosis: Acute pain of right shoulder - Plan: Ot plan of care cert/re-cert  Other symptoms and signs involving the musculoskeletal system - Plan: Ot plan of care cert/re-cert    Problem List Patient Active Problem List   Diagnosis Date Noted  . S/P shoulder surgery 05/21/2017  . S/P right rotator cuff repair 05/21/2017  . Radiculopathy 09/11/2016  . Weakness generalized 11/15/2012  . Paresthesias 11/15/2012  . Pain in limb 11/15/2012  . Numbness and tingling 11/15/2012    Ailene Ravel, OTR/L,CBIS  [phone]   07/28/2017, 4:53 PM  [place] [address] [place], [place], [address] Phone: [phone]   Fax:  [phone]  Name: Melissa Blackburn MRN: [id number] Date of Birth: [birth year]

## 2017-07-28 NOTE — Patient Instructions (Signed)
(  Home) Extension: Isometric / Bilateral Arm Retraction - Sitting   Facing anchor, hold hands and elbow at shoulder height, with elbow bent.  Pull arms back to squeeze shoulder blades together. Repeat 10-15 times. 1-3 times/day.   (Clinic) Extension / Flexion (Assist)   Face anchor, pull arms back, keeping elbow straight, and squeze shoulder blades together. Repeat 10-15 times. 1-3 times/day.   Copyright  VHI. All rights reserved.   (Home) Retraction: Row - Bilateral (Anchor)   Facing anchor, arms reaching forward, pull hands toward stomach, keeping elbows bent and at your sides and pinching shoulder blades together. Repeat 10-15 times. 1-3 times/day.   Copyright  VHI. All rights reserved.    ELASTIC BAND SHOULDER EXTERNAL ROTATION - ER  While holding an elastic band at your side with your elbow bent, start with your hand near your stomach and then pull the band away. Keep your elbow at your side the entire time. Complete 10-15 repetitions. 1-3 times/day.

## 2017-08-06 ENCOUNTER — Telehealth (HOSPITAL_COMMUNITY): Payer: Self-pay | Admitting: Internal Medicine

## 2017-08-06 ENCOUNTER — Ambulatory Visit (HOSPITAL_COMMUNITY): Payer: 59 | Admitting: Specialist

## 2017-08-06 NOTE — Telephone Encounter (Signed)
08/06/17   cx - going to a funeral and thought she would have enough time to go after therapy but won't - she wants to add this appt to the end of her schedule

## 2017-08-10 ENCOUNTER — Ambulatory Visit (HOSPITAL_COMMUNITY): Payer: 59 | Admitting: Specialist

## 2017-08-12 ENCOUNTER — Other Ambulatory Visit: Payer: Self-pay

## 2017-08-12 ENCOUNTER — Encounter (HOSPITAL_COMMUNITY): Payer: Self-pay | Admitting: Occupational Therapy

## 2017-08-12 ENCOUNTER — Ambulatory Visit (HOSPITAL_COMMUNITY): Payer: 59 | Attending: Orthopedic Surgery | Admitting: Occupational Therapy

## 2017-08-12 DIAGNOSIS — R29898 Other symptoms and signs involving the musculoskeletal system: Secondary | ICD-10-CM

## 2017-08-12 DIAGNOSIS — M25511 Pain in right shoulder: Secondary | ICD-10-CM | POA: Diagnosis not present

## 2017-08-12 NOTE — Therapy (Signed)
[place], [place], [address] Phone: [phone]   Fax:  [phone]  Occupational Therapy Treatment  Patient Details  Name: Melissa Blackburn MRN: [id number] Date of Birth: Nov 15, 1955 Referring Provider (Historical): Dr. Tania Ade   Encounter Date: 08/12/2017  OT End of Session - 08/12/17 1432    Visit Number  5    Number of Visits  16    Date for OT Re-Evaluation  08/27/17    Authorization Type  UMR    Authorization Time Period  $20 copay per visit    OT Start Time  1349    OT Stop Time  1431    OT Time Calculation (min)  42 min    Activity Tolerance  Patient tolerated treatment well    Behavior During Therapy  [facility] for tasks assessed/performed       Past Medical History:  Diagnosis Date  . Anemia    hx of  . Asthma    "adult onset" (09/11/2016)  . DDD (degenerative disc disease)    "hands, knees" (09/11/2016)  . Hypertension   . Migraine    "none since stimulator placed 07/2016" (09/11/2016)  . Needle stick injury with contaminated needle    patient undergoing testing  . Numbness and tingling 11/15/2012  . OSA on CPAP    cpap 7 (09/11/2016)  . Pain in limb 11/15/2012  . Paresthesias 11/15/2012  . Pneumonia 1980s X 1  . PONV (postoperative nausea and vomiting)    "severe nausea and vomiting"  . Weakness generalized 11/15/2012    Past Surgical History:  Procedure Laterality Date  . BACK SURGERY    . ESOPHAGOGASTRODUODENOSCOPY (EGD) WITH PROPOFOL N/A 01/10/2016   Procedure: ESOPHAGOGASTRODUODENOSCOPY (EGD) WITH PROPOFOL;  Surgeon: Milus Banister, MD;  [facility]: [facility];  Service: Endoscopy;  Laterality: N/A;  . KNEE ARTHROSCOPY  04/23/2012   Procedure: ARTHROSCOPY KNEE;  Surgeon: Augustin Schooling, MD;  Location: [place];  Service: Orthopedics;  Laterality: Left;  lateral meniscectomy  . KNEE ARTHROSCOPY Bilateral    "bone fragments"  . KNEE ARTHROSCOPY W/ ACL RECONSTRUCTION Left 2009  . KNEE ARTHROSCOPY  W/ MENISCAL REPAIR Left 1981  . KNEE ARTHROSCOPY W/ MENISCAL REPAIR Left 2012  . LUMBAR FUSION Left 09/11/2016   LUMBAR 4-5 TRANSFORAMINAL LUMBAR INTERBODY FUSION WITH INSTRUMENTATION AND Gertha Calkin Archie Endo 09/11/2016  . PERIPHERAL NERVE STIMULATOR Right 07/2016   "in my chest; to treat migraine headaches"  . RECTOCELE REPAIR    . SACROILIAC JOINT FUSION Left 04/09/2017   Procedure: LEFT SIDED SACROILIAC JOINT FUSION;  Surgeon: Phylliss Bob, MD;  Location: [place];  Service: Orthopedics;  Laterality: Left;  . SHOULDER ARTHROSCOPY W/ SUPERIOR LABRAL ANTERIOR POSTERIOR REPAIR Bilateral    "I have rivets in both shoulders to hold them together" (09/11/2016)  . SHOULDER ARTHROSCOPY WITH ROTATOR CUFF REPAIR AND SUBACROMIAL DECOMPRESSION Right 05/21/2017   Procedure: RIGHT SHOULDER ARTHROSCOPY WITH ROTATOR CUFF REPAIR VS DEBRIDEMENT, SUBACROMIAL DECOMPRESSION, DISTAL CLAVICAL EXCISION;  Surgeon: Tania Ade, MD;  Location: [place];  Service: Orthopedics;  Laterality: Right;  SHOULDER ARTHROSCOPY WITH ROTATOR CUFF REPAIR VS DEBRIDEMENT, SUBACROMIAL DECOMPRESSION, DISTAL CLAVICAL EXCISION  . SHOULDER ARTHROSCOPY WITH SUBACROMIAL DECOMPRESSION Right 1994 X 2; 1995; ~ 2010  . UPPER ESOPHAGEAL ENDOSCOPIC ULTRASOUND (EUS)  01/10/2016   Procedure: UPPER ESOPHAGEAL ENDOSCOPIC ULTRASOUND (EUS);  Surgeon: Milus Banister, MD;  Location: [facility];  Service: Endoscopy;;  . VAGINAL HYSTERECTOMY  1986   "partial"    There were no  vitals filed for this visit.  Subjective Assessment - 08/12/17 1350    Subjective   S: It's been more uncomfortrable at night.     Currently in Pain?  Yes         [facility] OT Assessment - 08/12/17 1350      Assessment   Medical Diagnosis  Right Rotator Cuff Repair      Precautions   Precautions  Shoulder    Type of Shoulder Precautions  No lifting over 5lbs. Progress as tolerated.               OT Treatments/Exercises (OP) - 08/12/17 1352      Exercises   Exercises   Shoulder      Shoulder Exercises: Supine   Protraction  PROM;5 reps;AROM;10 reps    Horizontal ABduction  PROM;5 reps;AROM;10 reps    External Rotation  PROM;5 reps;AROM;10 reps    Internal Rotation  PROM;5 reps;AROM;10 reps    Flexion  PROM;5 reps;AROM;10 reps    ABduction  PROM;5 reps;AROM;10 reps      Shoulder Exercises: Standing   Protraction  Theraband;10 reps    Theraband Level (Shoulder Protraction)  Level 2 (Red)    Horizontal ABduction  Theraband;10 reps    Theraband Level (Shoulder Horizontal ABduction)  Level 2 (Red)    External Rotation  Theraband;10 reps    Theraband Level (Shoulder External Rotation)  Level 2 (Red)    Internal Rotation  Theraband;10 reps    Theraband Level (Shoulder Internal Rotation)  Level 2 (Red)    Flexion  Theraband;10 reps    Theraband Level (Shoulder Flexion)  Level 2 (Red)    ABduction  Theraband;10 reps    Theraband Level (Shoulder ABduction)  Level 2 (Red)    Extension  Theraband;10 reps    Theraband Level (Shoulder Extension)  Level 2 (Red)    Row  Theraband;10 reps    Theraband Level (Shoulder Row)  Level 2 (Red)    Retraction  Theraband;10 reps    Theraband Level (Shoulder Retraction)  Level 2 (Red)      Shoulder Exercises: ROM/Strengthening   UBE (Upper Arm Bike)  Level 1 2' forward 2' reverse    Over Head Lace  1'    Proximal Shoulder Strengthening, Supine  10X each no rest breaks      Shoulder Exercises: Stretch   Wall Stretch - Flexion  2 reps;10 seconds      Manual Therapy   Manual Therapy  Myofascial release    Manual therapy comments  completed separately from therapeutic exercise    Myofascial Release  Myofascial release and manual stretching completed to right upper arm, trapezius, and scapularis region to decreased fascial restrictions and increase joint mobility in a pain free zone.               OT Short Term Goals - 07/28/17 1646      OT SHORT TERM GOAL #1   Title  Pt will be provided with and educated  on HEP to improve mobility in RUE for use as dominant during functional tasks.     Time  4    Period  Weeks    Status  Achieved      OT SHORT TERM GOAL #2   Title  Pt will increase P/ROM of RUE to WNL to improve ability to donn shirts with greater ease.     Time  4    Period  Weeks    Status  Achieved  OT SHORT TERM GOAL #3   Title  Pt will decrease RUE pain to 4/10 to improve ability to sleep.     Time  4    Period  Weeks    Status  Achieved        OT Long Term Goals - 07/28/17 1646      OT LONG TERM GOAL #1   Title  Pt will return to highest level of functioning and independence in daily tasks using RUE as dominant.     Time  8    Period  Weeks    Status  On-going    Target Date  08/27/17      OT LONG TERM GOAL #2   Title  Pt will decrease pain in RUE to 2/10 or less to improve ability to improve ability to perform school tasks on the computer with greater ease.     Time  8    Period  Weeks    Status  Achieved      OT LONG TERM GOAL #3   Title  Pt will decrease fascial restrictions in RUE from max amounts to min amounts or less to improve mobility required for functional reaching tasks.     Time  8    Period  Weeks    Status  Achieved      OT LONG TERM GOAL #4   Title  Pt will improve RUE A/ROM to [facility] to improve ability to reach overhead when working or during school tasks.     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     Time  8    Period  Weeks    Status  On-going            Plan - 08/12/17 1432    Clinical Impression Statement  A: Began A/ROM in supine, progressing to red theraband strengthening in standing today. Pt has tightness along anterior deltoid and in scapular region today, reports she has been very busy over Christmas with family visiting and [place] activities. Pt did well today, moderate fatigue at end of  session. Pt requiring occasional verbal cuing for form and technique during session.     Plan  P: Continue to focus on strengthening RUE progressing as tolerated. Add sidelying exercises for improved scapular stability    Consulted and Agree with Plan of Care  Patient       Patient will benefit from skilled therapeutic intervention in order to improve the following deficits and impairments:  Decreased activity tolerance, Decreased strength, Pain, Increased fascial restrictions, Decreased range of motion  Visit Diagnosis: Acute pain of right shoulder  Other symptoms and signs involving the musculoskeletal system    Problem List Patient Active Problem List   Diagnosis Date Noted  . S/P shoulder surgery 05/21/2017  . S/P right rotator cuff repair 05/21/2017  . Radiculopathy 09/11/2016  . Weakness generalized 11/15/2012  . Paresthesias 11/15/2012  . Pain in limb 11/15/2012  . Numbness and tingling 11/15/2012   Guadelupe Sabin, OTR/L  [phone] 08/12/2017, 2:55 PM  [place] [address] [place], [place], [address] Phone: [phone]   Fax:  [phone]  Name: Melissa Blackburn MRN: [id number] Date of Birth: 03/03/1956

## 2017-08-17 ENCOUNTER — Encounter (HOSPITAL_COMMUNITY): Payer: Self-pay | Admitting: Specialist

## 2017-08-19 ENCOUNTER — Other Ambulatory Visit: Payer: Self-pay

## 2017-08-19 ENCOUNTER — Telehealth (HOSPITAL_COMMUNITY): Payer: Self-pay | Admitting: Internal Medicine

## 2017-08-19 ENCOUNTER — Encounter (HOSPITAL_COMMUNITY): Payer: Self-pay | Admitting: Occupational Therapy

## 2017-08-19 ENCOUNTER — Ambulatory Visit (HOSPITAL_COMMUNITY): Payer: 59 | Admitting: Occupational Therapy

## 2017-08-19 DIAGNOSIS — M25511 Pain in right shoulder: Secondary | ICD-10-CM | POA: Diagnosis not present

## 2017-08-19 DIAGNOSIS — R29898 Other symptoms and signs involving the musculoskeletal system: Secondary | ICD-10-CM

## 2017-08-19 NOTE — Patient Instructions (Signed)
Theraband strengthening: Complete 10-15X, 1-2X/day  1) Shoulder protraction  Anchor band in doorway, stand with back to door. Push your hand forward as much as you can to bringing your shoulder blades forward on your rib cage.     2) Shoulder flexion  While standing with back to the door, holding Theraband at hand level, raise arm in front of you.  Keep elbow straight through entire movement.      3) Shoulder horizontal abduction  Standing with a theraband anchored at chest height, begin with arm straight and some tension in the band. Move your arm out to your side (keeping straight the whole time). Bring the affected arm back to midline.     4) Shoulder Internal Rotation  While holding an elastic band at your side with your elbow bent, start with your hand away from your stomach, then pull the band towards your stomach. Keep your elbow near your side the entire time.     5) Shoulder abduction  While holding an elastic band at your side, draw up your arm to the side keeping your elbow straight.     1) Flexion Wall Stretch    Face wall, place affected handon wall in front of you. Slide hand up the wall  and lean body in towards the wall. Hold for 10 seconds. Repeat 3-5 times. 1-2 times/day.     2) Towel Stretch with Internal Rotation   Or     Gently pull up (or to the side) your affected arm  behind your back with the assist of a towel. Hold 10 seconds, repeat 3-5 times. 1-2 times/day.             3) Corner Stretch    Stand at a corner of a wall, place your arms on the walls with elbows bent. Lean into the corner until a stretch is felt along the front of your chest and/or shoulders. Hold for 10 seconds. Repeat 3-5X, 1-2 times/day.    4) Posterior Capsule Stretch    Bring the involved arm across chest. Grasp elbow and pull toward chest until you feel a stretch in the back of the upper arm and shoulder. Hold 10 seconds. Repeat 3-5X. Complete 1-2  times/day.     5) External Rotation Stretch:     Place your affected hand on the wall with the elbow bent and gently turn your body the opposite direction until a stretch is felt. Hold 10 seconds, repeat 3-5X. Complete 1-2 times/day.   OR    Standing in an open doorway, place your arm on the edge of the doorway with the shoulder at 90 degrees from the side and elbow bent to 90 degrees. Lean forward until you feel a stretch on the front of your shoulder. Keep neck relaxed. Hold 10 seconds, repeat 3-5X. Complete 1-2 times/day

## 2017-08-19 NOTE — Telephone Encounter (Signed)
08/19/17  pt having to go to [facility] to take her mom to the dr.... she will call back to reschedule

## 2017-08-19 NOTE — Therapy (Addendum)
[place], [place], [address] Phone: [phone]   Fax:  [phone]  Occupational Therapy Treatment and Discharge  Patient Details  Name: Melissa Blackburn MRN: [id number] Date of Birth: 05/01/1956 Referring Provider (Historical): Dr. Tania Ade   Encounter Date: 08/19/2017  OT End of Session - 08/19/17 1514    Visit Number  6    Number of Visits  16    Date for OT Re-Evaluation  08/27/17    Authorization Type  [facility]    Authorization Time Period  $20 copay per visit    OT Start Time  1431    OT Stop Time  1509    OT Time Calculation (min)  38 min    Activity Tolerance  Patient tolerated treatment well    Behavior During Therapy  [facility] for tasks assessed/performed       Past Medical History:  Diagnosis Date  . Anemia    hx of  . Asthma    "adult onset" (09/11/2016)  . DDD (degenerative disc disease)    "hands, knees" (09/11/2016)  . Hypertension   . Migraine    "none since stimulator placed 07/2016" (09/11/2016)  . Needle stick injury with contaminated needle    patient undergoing testing  . Numbness and tingling 11/15/2012  . OSA on CPAP    cpap 7 (09/11/2016)  . Pain in limb 11/15/2012  . Paresthesias 11/15/2012  . Pneumonia 1980s X 1  . PONV (postoperative nausea and vomiting)    "severe nausea and vomiting"  . Weakness generalized 11/15/2012    Past Surgical History:  Procedure Laterality Date  . BACK SURGERY    . ESOPHAGOGASTRODUODENOSCOPY (EGD) WITH PROPOFOL N/A 01/10/2016   Procedure: ESOPHAGOGASTRODUODENOSCOPY (EGD) WITH PROPOFOL;  Surgeon: Milus Banister, MD;  Location: [facility];  Service: Endoscopy;  Laterality: N/A;  . KNEE ARTHROSCOPY  04/23/2012   Procedure: ARTHROSCOPY KNEE;  Surgeon: Augustin Schooling, MD;  Location: [facility];  Service: Orthopedics;  Laterality: Left;  lateral meniscectomy  . KNEE ARTHROSCOPY Bilateral    "bone fragments"  . KNEE ARTHROSCOPY W/ ACL RECONSTRUCTION Left 2009  .  KNEE ARTHROSCOPY W/ MENISCAL REPAIR Left 1981  . KNEE ARTHROSCOPY W/ MENISCAL REPAIR Left 2012  . LUMBAR FUSION Left 09/11/2016   LUMBAR 4-5 TRANSFORAMINAL LUMBAR INTERBODY FUSION WITH INSTRUMENTATION AND Gertha Calkin Archie Endo 09/11/2016  . PERIPHERAL NERVE STIMULATOR Right 07/2016   "in my chest; to treat migraine headaches"  . RECTOCELE REPAIR    . SACROILIAC JOINT FUSION Left 04/09/2017   Procedure: LEFT SIDED SACROILIAC JOINT FUSION;  Surgeon: Phylliss Bob, MD;  Location: [facility];  Service: Orthopedics;  Laterality: Left;  . SHOULDER ARTHROSCOPY W/ SUPERIOR LABRAL ANTERIOR POSTERIOR REPAIR Bilateral    "I have rivets in both shoulders to hold them together" (09/11/2016)  . SHOULDER ARTHROSCOPY WITH ROTATOR CUFF REPAIR AND SUBACROMIAL DECOMPRESSION Right 05/21/2017   Procedure: RIGHT SHOULDER ARTHROSCOPY WITH ROTATOR CUFF REPAIR VS DEBRIDEMENT, SUBACROMIAL DECOMPRESSION, DISTAL CLAVICAL EXCISION;  Surgeon: Tania Ade, MD;  Location: [facility];  Service: Orthopedics;  Laterality: Right;  SHOULDER ARTHROSCOPY WITH ROTATOR CUFF REPAIR VS DEBRIDEMENT, SUBACROMIAL DECOMPRESSION, DISTAL CLAVICAL EXCISION  . SHOULDER ARTHROSCOPY WITH SUBACROMIAL DECOMPRESSION Right 1994 X 2; 1995; ~ 2010  . UPPER ESOPHAGEAL ENDOSCOPIC ULTRASOUND (EUS)  01/10/2016   Procedure: UPPER ESOPHAGEAL ENDOSCOPIC ULTRASOUND (EUS);  Surgeon: Milus Banister, MD;  Location: [facility];  Service: Endoscopy;;  . VAGINAL HYSTERECTOMY  1986   "partial"    There  were no vitals filed for this visit.  Subjective Assessment - 08/19/17 1434    Subjective   S: I have a lot going on right now.     Currently in Pain?  Yes    Pain Score  2     Pain Location  Shoulder    Pain Orientation  Right    Pain Descriptors / Indicators  Aching;Sore    Pain Type  Acute pain    Pain Radiating Towards  n/a    Pain Onset  More than a month ago    Pain Frequency  Constant    Aggravating Factors   certain movements    Pain Relieving Factors  rest,  heat    Effect of Pain on Daily Activities  min effect on ADL completion    Multiple Pain Sites  No         [facility] OT Assessment - 08/19/17 1433      Assessment   Medical Diagnosis  Right Rotator Cuff Repair      Precautions   Precautions  Shoulder    Type of Shoulder Precautions  No lifting over 5lbs. Progress as tolerated.      Observation/Other Assessments   Focus on Therapeutic Outcomes (FOTO)   64/100      AROM   AROM Assessment Site  Shoulder    Right Shoulder Flexion  180 Degrees 150 previous    Right Shoulder ABduction  180 Degrees 166 previous    Right Shoulder Internal Rotation  90 Degrees same as previous    Right Shoulder External Rotation  65 Degrees 54 previous      PROM   Overall PROM   Within functional limits for tasks performed    Overall PROM Comments  Shoulder all ranges.      Strength   Overall Strength Comments  Assessed Seated. IR/er abducted    Strength Assessment Site  Shoulder    Right/Left Shoulder  Right    Right Shoulder Flexion  5/5 same as previous    Right Shoulder ABduction  5/5 same as previous    Right Shoulder Internal Rotation  5/5 4/5 previous    Right Shoulder External Rotation  4+/5 4/5 previous               OT Treatments/Exercises (OP) - 08/19/17 1434      Exercises   Exercises  Shoulder      Shoulder Exercises: Supine   Protraction  PROM;5 reps;Strengthening;12 reps    Protraction Weight (lbs)  1    Horizontal ABduction  PROM;5 reps;Strengthening;12 reps    Horizontal ABduction Weight (lbs)  1    External Rotation  PROM;5 reps;Strengthening;12 reps    External Rotation Weight (lbs)  1    Internal Rotation  PROM;5 reps;Strengthening;12 reps    Internal Rotation Weight (lbs)  1    Flexion  PROM;5 reps;Strengthening;12 reps    Shoulder Flexion Weight (lbs)  1    ABduction  PROM;5 reps;Strengthening;12 reps    Shoulder ABduction Weight (lbs)  1      Shoulder Exercises: Standing   Protraction  Theraband;10 reps     Theraband Level (Shoulder Protraction)  Level 3 (Green)    Horizontal ABduction  Theraband;10 reps    Theraband Level (Shoulder Horizontal ABduction)  Level 3 (Green)    External Rotation  Theraband;10 reps    Theraband Level (Shoulder External Rotation)  Level 3 (Green)    Internal Rotation  Theraband;10 reps    Theraband Level (  Shoulder Internal Rotation)  Level 3 (Green)    Flexion  Theraband;10 reps    Theraband Level (Shoulder Flexion)  Level 3 (Green)    ABduction  Theraband;10 reps    Theraband Level (Shoulder ABduction)  Level 3 (Green)    Extension  [employer] reps    Theraband Level (Shoulder Extension)  Level 3 (Green)    Row  [employer] reps    Theraband Level (Shoulder Row)  Level 3 (Green)    Retraction  Theraband;10 reps    Theraband Level (Shoulder Retraction)  Level 3 (Green)      Shoulder Exercises: ROM/Strengthening   Ball on Wall  1' flexion      Manual Therapy   Manual Therapy  Myofascial release    Manual therapy comments  completed separately from therapeutic exercise    Myofascial Release  Myofascial release and manual stretching completed to right upper arm, trapezius, and scapularis region to decreased fascial restrictions and increase joint mobility in a pain free zone.             OT Education - 08/19/17 1449    Education provided  Yes    Education Details  theraband strengthening, shoulder stretches, and educated on adding weights to A/ROM for strengthening    Person(s) Educated  Patient    Methods  Demonstration;Handout    Comprehension  Verbalized understanding;Returned demonstration       OT Short Term Goals - 08/19/17 1517      OT SHORT TERM GOAL #1   Title  Pt will be provided with and educated on HEP to improve mobility in RUE for use as dominant during functional tasks.     Time  4    Period  Weeks    Status  Achieved      OT SHORT TERM GOAL #2   Title  Pt will increase P/ROM of RUE to WNL to improve ability to donn shirts  with greater ease.     Time  4    Period  Weeks    Status  Achieved      OT SHORT TERM GOAL #3   Title  Pt will decrease RUE pain to 4/10 to improve ability to sleep.     Time  4    Period  Weeks    Status  Achieved        OT Long Term Goals - 08/19/17 1517      OT LONG TERM GOAL #1   Title  Pt will return to highest level of functioning and independence in daily tasks using RUE as dominant.     Time  8    Period  Weeks    Status  Achieved      OT LONG TERM GOAL #2   Title  Pt will decrease pain in RUE to 2/10 or less to improve ability to improve ability to perform school tasks on the computer with greater ease.     Time  8    Period  Weeks    Status  Achieved      OT LONG TERM GOAL #3   Title  Pt will decrease fascial restrictions in RUE from max amounts to min amounts or less to improve mobility required for functional reaching tasks.     Time  8    Period  Weeks    Status  Achieved      OT LONG TERM GOAL #4   Title  Pt will improve RUE A/ROM to [facility] to  improve ability to reach overhead when working or during school tasks.     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     Time  8    Period  Weeks    Status  Achieved            Plan - 08/19/17 1515    Clinical Impression Statement  A: Pt reports she is going back to work tomorrow and will continue with school. She is also helping her mother who lives in [place]. Pt requests to be discharged with HEP due to time constraints with visits. Reassessment completed, pt has met all goals. Progressed pt to strengthening today, updated HEP for theraband strengthening and provided shoulder stretches. Pt demonstrates good form with all exercises.     Plan  P: Discharge pt       Patient will benefit from skilled therapeutic intervention in order to improve the following deficits and  impairments:  Decreased activity tolerance, Decreased strength, Pain, Increased fascial restrictions, Decreased range of motion  Visit Diagnosis: Acute pain of right shoulder  Other symptoms and signs involving the musculoskeletal system    Problem List Patient Active Problem List   Diagnosis Date Noted  . S/P shoulder surgery 05/21/2017  . S/P right rotator cuff repair 05/21/2017  . Radiculopathy 09/11/2016  . Weakness generalized 11/15/2012  . Paresthesias 11/15/2012  . Pain in limb 11/15/2012  . Numbness and tingling 11/15/2012   Guadelupe Sabin, OTR/L  [phone] 08/19/2017, 3:21 PM  [facility] [address] [place], [place], [address] Phone: [phone]   Fax:  [phone]  Name: Melissa Blackburn MRN: [id number] Date of Birth: [birth year]    OCCUPATIONAL THERAPY DISCHARGE SUMMARY  Visits from Start of Care: 6  Current functional level related to goals / functional outcomes: See above. Pt is independent in all B/IADL tasks and using RUE as dominant.    Remaining deficits: Pt continues to have decreased activity tolerance and occasional pain in the RUE.    Education / Equipment: HEP for shoulder stretches and green theraband strengthening  Plan: Patient agrees to discharge.  Patient goals were met. Patient is being discharged due to the patient's request.  ?????

## 2017-08-20 ENCOUNTER — Ambulatory Visit (HOSPITAL_COMMUNITY): Payer: 59

## 2017-08-28 MED FILL — ATENOLOL 25 MG TABLET: 25 | 90 days supply | Qty: 90 | Fill #0

## 2017-08-29 IMAGING — DX DG CHEST 2V
2 series · 2 of 2 positions shown · non-contrast
Comparison: Chest radiograph performed 09/05/2016

CLINICAL DATA: Acute onset of productive cough. Mid chest
tightness. Initial encounter.

EXAM:
CHEST  2 VIEW

[chest pa]
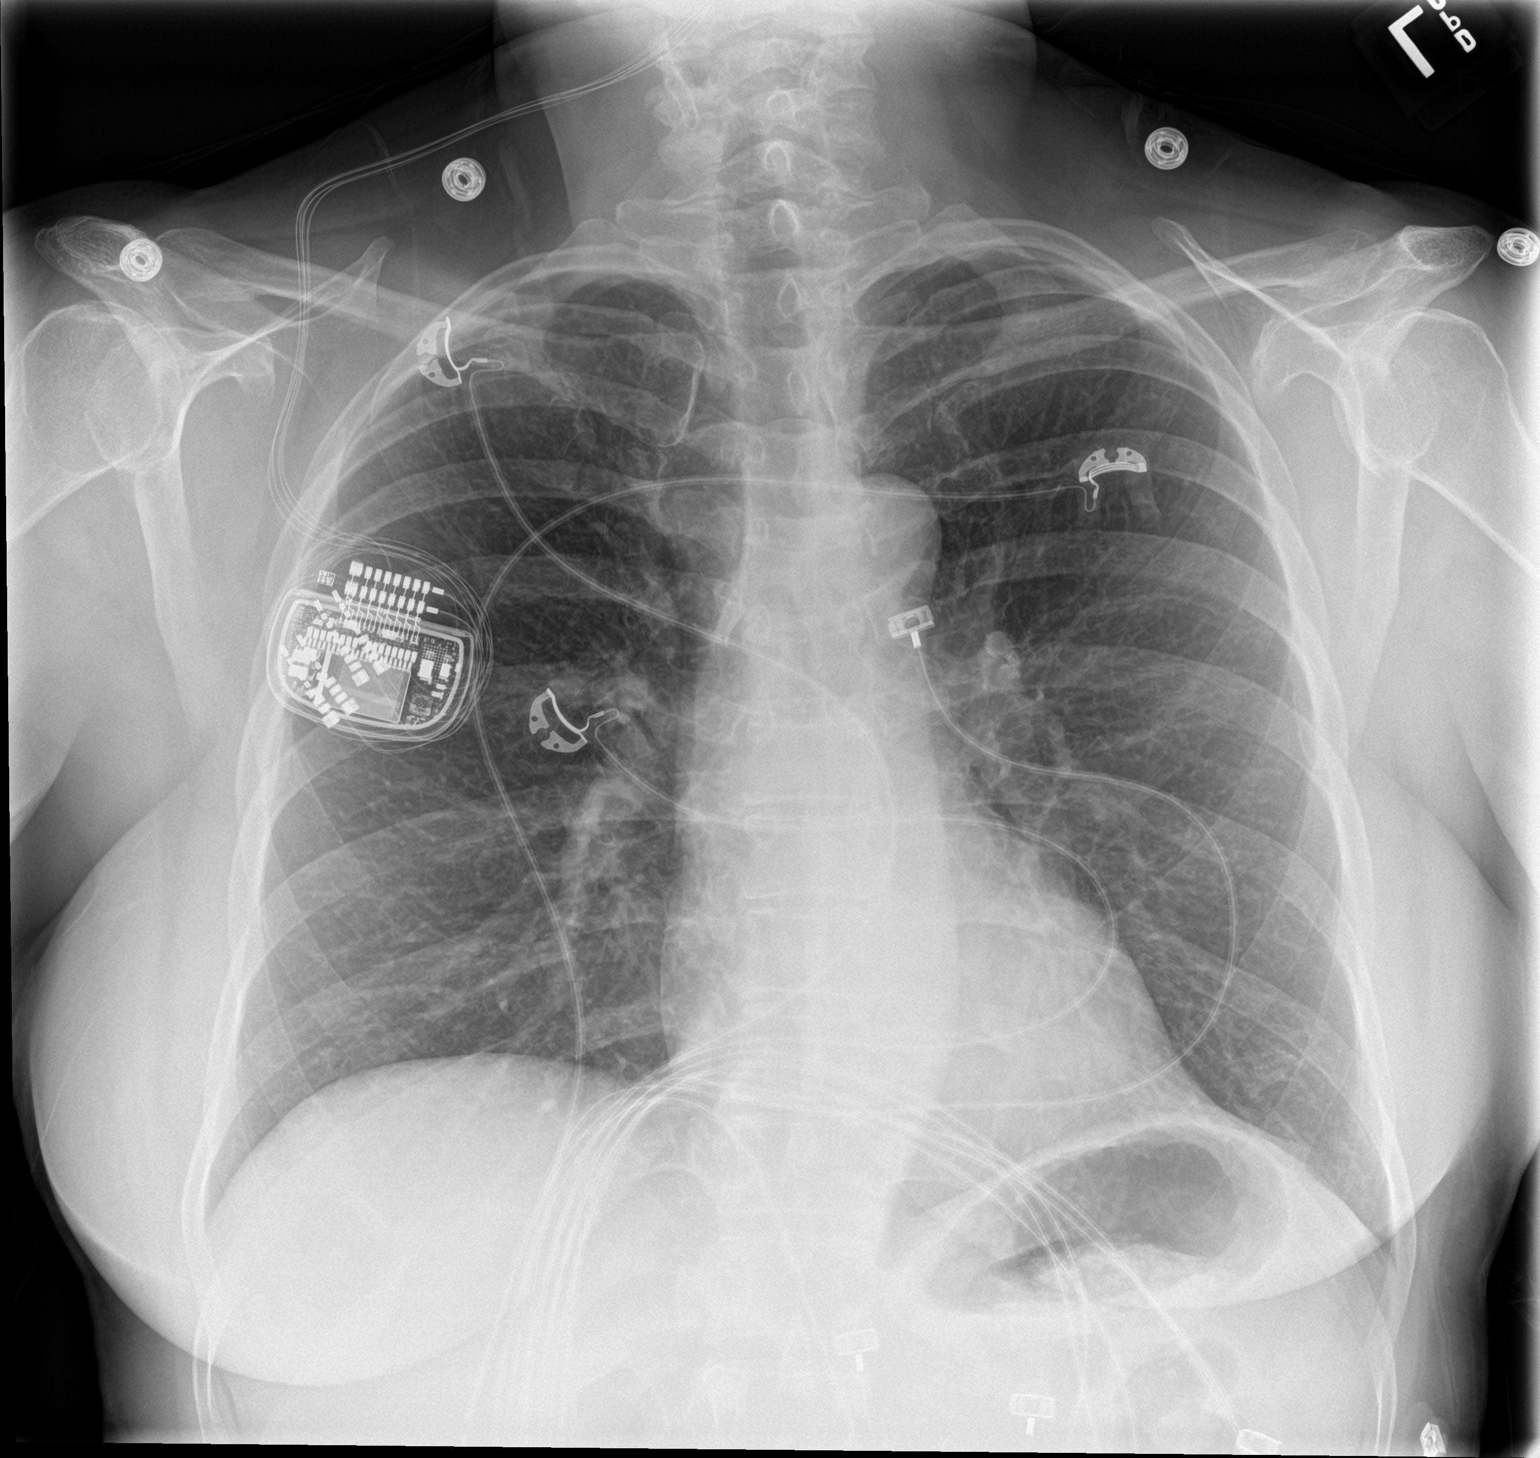

[chest lat]
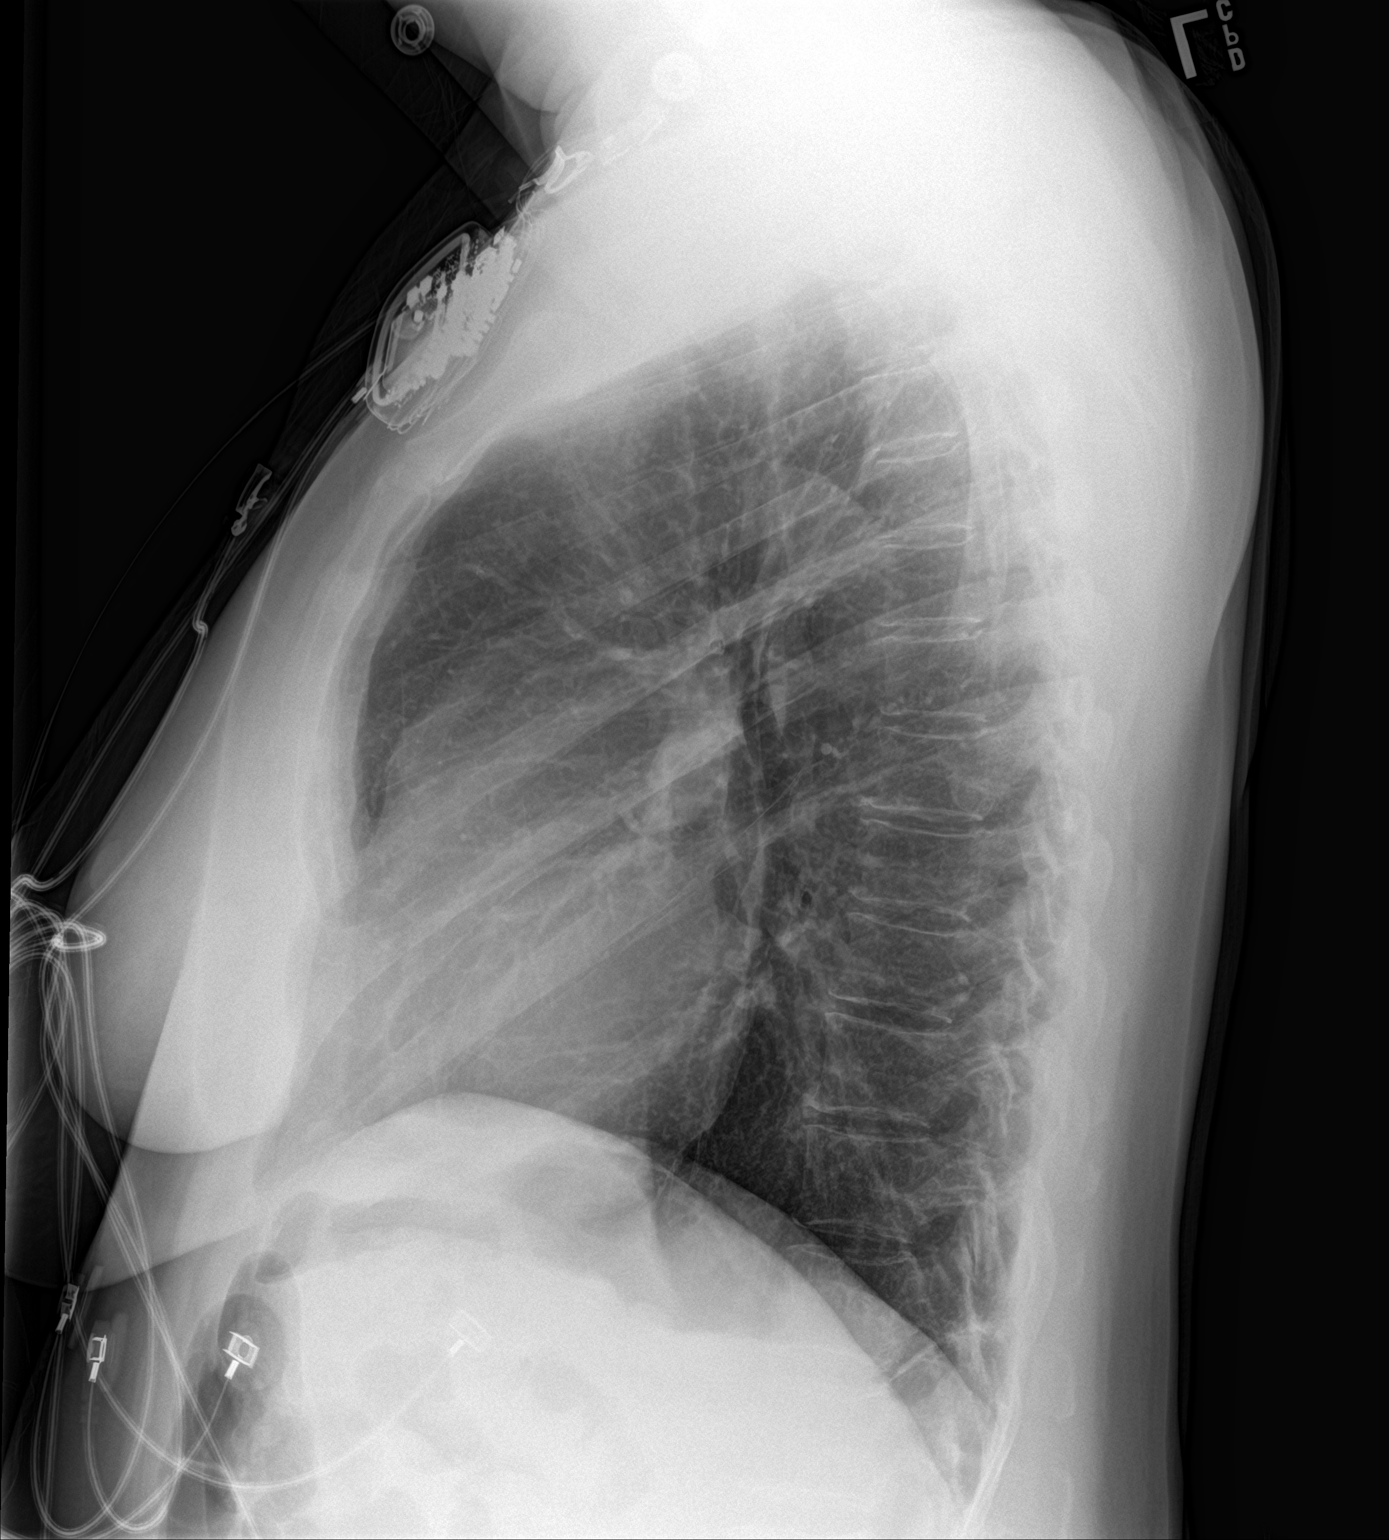

[2 of 2 positions shown; findings below may reference images not displayed]

FINDINGS: The lungs are well-aerated and clear. There is no evidence of focal
opacification, pleural effusion or pneumothorax.

The heart is normal in size; the mediastinal contour is within
normal limits. A metallic device is noted at the right chest wall,
with leads extending superiorly into the neck. No acute osseous
abnormalities are seen.
IMPRESSION: No acute cardiopulmonary process seen.

## 2017-09-30 DIAGNOSIS — M25511 Pain in right shoulder: Secondary | ICD-10-CM | POA: Diagnosis not present

## 2017-10-21 DIAGNOSIS — G4733 Obstructive sleep apnea (adult) (pediatric): Secondary | ICD-10-CM | POA: Diagnosis not present

## 2017-10-22 MED FILL — AMITRIPTYLINE HCL 50 MG TAB: 50 | 90 days supply | Qty: 90 | Fill #0

## 2017-10-22 MED FILL — SHIPPING COST: 1 days supply | Qty: 1 | Fill #0

## 2017-11-07 DIAGNOSIS — M79676 Pain in unspecified toe(s): Secondary | ICD-10-CM | POA: Diagnosis not present

## 2017-11-07 DIAGNOSIS — M719 Bursopathy, unspecified: Secondary | ICD-10-CM | POA: Diagnosis not present

## 2017-11-16 DIAGNOSIS — Z09 Encounter for follow-up examination after completed treatment for conditions other than malignant neoplasm: Secondary | ICD-10-CM | POA: Diagnosis not present

## 2017-11-18 MED FILL — ATENOLOL 25 MG TABLET: 25 | 90 days supply | Qty: 90 | Fill #1

## 2017-12-10 DIAGNOSIS — D3132 Benign neoplasm of left choroid: Secondary | ICD-10-CM | POA: Diagnosis not present

## 2017-12-10 DIAGNOSIS — H43391 Other vitreous opacities, right eye: Secondary | ICD-10-CM | POA: Diagnosis not present

## 2017-12-10 DIAGNOSIS — H35371 Puckering of macula, right eye: Secondary | ICD-10-CM | POA: Diagnosis not present

## 2017-12-31 DIAGNOSIS — H43811 Vitreous degeneration, right eye: Secondary | ICD-10-CM | POA: Diagnosis not present

## 2017-12-31 DIAGNOSIS — D3132 Benign neoplasm of left choroid: Secondary | ICD-10-CM | POA: Diagnosis not present

## 2018-01-20 MED FILL — AMITRIPTYLINE HCL 50 MG TAB: 50 | 90 days supply | Qty: 90 | Fill #1

## 2018-01-20 MED FILL — SHIPPING COST: 1 days supply | Qty: 1 | Fill #1

## 2018-01-23 DIAGNOSIS — R05 Cough: Secondary | ICD-10-CM | POA: Diagnosis not present

## 2018-01-23 DIAGNOSIS — J329 Chronic sinusitis, unspecified: Secondary | ICD-10-CM | POA: Diagnosis not present

## 2018-01-23 DIAGNOSIS — J209 Acute bronchitis, unspecified: Secondary | ICD-10-CM | POA: Diagnosis not present

## 2018-01-29 DIAGNOSIS — R42 Dizziness and giddiness: Secondary | ICD-10-CM | POA: Diagnosis not present

## 2018-02-03 ENCOUNTER — Other Ambulatory Visit: Payer: Self-pay | Admitting: Internal Medicine

## 2018-02-03 DIAGNOSIS — R531 Weakness: Secondary | ICD-10-CM

## 2018-02-03 DIAGNOSIS — R519 Headache, unspecified: Secondary | ICD-10-CM

## 2018-02-03 DIAGNOSIS — R51 Headache: Secondary | ICD-10-CM

## 2018-02-03 DIAGNOSIS — M6281 Muscle weakness (generalized): Secondary | ICD-10-CM

## 2018-02-03 DIAGNOSIS — R202 Paresthesia of skin: Secondary | ICD-10-CM

## 2018-02-05 ENCOUNTER — Ambulatory Visit (HOSPITAL_COMMUNITY): Payer: 59

## 2018-02-05 ENCOUNTER — Other Ambulatory Visit: Payer: Self-pay | Admitting: Internal Medicine

## 2018-02-05 DIAGNOSIS — R51 Headache: Secondary | ICD-10-CM

## 2018-02-05 DIAGNOSIS — R202 Paresthesia of skin: Secondary | ICD-10-CM

## 2018-02-05 DIAGNOSIS — R519 Headache, unspecified: Secondary | ICD-10-CM

## 2018-02-05 DIAGNOSIS — R200 Anesthesia of skin: Secondary | ICD-10-CM

## 2018-02-05 DIAGNOSIS — R531 Weakness: Secondary | ICD-10-CM

## 2018-02-09 ENCOUNTER — Ambulatory Visit (HOSPITAL_COMMUNITY)
Admission: RE | Admit: 2018-02-09 | Discharge: 2018-02-09 | Disposition: A | Discharge status: Home / Self Care | Payer: 59 | Source: Ambulatory Visit | Attending: Internal Medicine | Admitting: Internal Medicine

## 2018-02-09 DIAGNOSIS — R200 Anesthesia of skin: Secondary | ICD-10-CM | POA: Insufficient documentation

## 2018-02-09 DIAGNOSIS — R202 Paresthesia of skin: Secondary | ICD-10-CM | POA: Diagnosis not present

## 2018-02-09 DIAGNOSIS — Z9689 Presence of other specified functional implants: Secondary | ICD-10-CM | POA: Diagnosis not present

## 2018-02-09 DIAGNOSIS — R519 Headache, unspecified: Secondary | ICD-10-CM

## 2018-02-09 DIAGNOSIS — R531 Weakness: Secondary | ICD-10-CM | POA: Insufficient documentation

## 2018-02-09 DIAGNOSIS — R51 Headache: Secondary | ICD-10-CM | POA: Insufficient documentation

## 2018-02-12 MED FILL — ATENOLOL 25 MG TABLET: 25 | 90 days supply | Qty: 90 | Fill #2

## 2018-02-19 ENCOUNTER — Ambulatory Visit (HOSPITAL_COMMUNITY): Payer: 59

## 2018-02-25 MED FILL — BACLOFEN 10 MG TABLET: 10 | 30 days supply | Qty: 90 | Fill #1

## 2018-03-31 DIAGNOSIS — R42 Dizziness and giddiness: Secondary | ICD-10-CM | POA: Diagnosis not present

## 2018-03-31 DIAGNOSIS — Z0000 Encounter for general adult medical examination without abnormal findings: Secondary | ICD-10-CM | POA: Diagnosis not present

## 2018-04-06 DIAGNOSIS — R42 Dizziness and giddiness: Secondary | ICD-10-CM | POA: Diagnosis not present

## 2018-04-06 DIAGNOSIS — Z111 Encounter for screening for respiratory tuberculosis: Secondary | ICD-10-CM | POA: Diagnosis not present

## 2018-04-09 ENCOUNTER — Other Ambulatory Visit: Payer: Self-pay | Admitting: Internal Medicine

## 2018-04-09 DIAGNOSIS — G4700 Insomnia, unspecified: Secondary | ICD-10-CM | POA: Diagnosis not present

## 2018-04-09 DIAGNOSIS — I10 Essential (primary) hypertension: Secondary | ICD-10-CM | POA: Diagnosis not present

## 2018-04-09 DIAGNOSIS — Z0000 Encounter for general adult medical examination without abnormal findings: Secondary | ICD-10-CM | POA: Diagnosis not present

## 2018-04-09 DIAGNOSIS — M1380 Other specified arthritis, unspecified site: Secondary | ICD-10-CM | POA: Diagnosis not present

## 2018-04-09 DIAGNOSIS — Z780 Asymptomatic menopausal state: Secondary | ICD-10-CM

## 2018-04-09 DIAGNOSIS — E782 Mixed hyperlipidemia: Secondary | ICD-10-CM | POA: Diagnosis not present

## 2018-04-09 DIAGNOSIS — Z1231 Encounter for screening mammogram for malignant neoplasm of breast: Secondary | ICD-10-CM

## 2018-04-09 DIAGNOSIS — Z6828 Body mass index (BMI) 28.0-28.9, adult: Secondary | ICD-10-CM | POA: Diagnosis not present

## 2018-04-19 MED FILL — SHIPPING COST: 1 days supply | Qty: 1 | Fill #2

## 2018-04-19 MED FILL — AMITRIPTYLINE HCL 50 MG TAB: 50 | 90 days supply | Qty: 90 | Fill #0

## 2018-04-26 DIAGNOSIS — Z1211 Encounter for screening for malignant neoplasm of colon: Secondary | ICD-10-CM | POA: Diagnosis not present

## 2018-05-10 ENCOUNTER — Ambulatory Visit (HOSPITAL_COMMUNITY)
Admission: RE | Admit: 2018-05-10 | Discharge: 2018-05-10 | Disposition: A | Discharge status: Home / Self Care | Payer: 59 | Source: Ambulatory Visit | Attending: Internal Medicine | Admitting: Internal Medicine

## 2018-05-10 DIAGNOSIS — M85851 Other specified disorders of bone density and structure, right thigh: Secondary | ICD-10-CM | POA: Insufficient documentation

## 2018-05-10 DIAGNOSIS — Z1231 Encounter for screening mammogram for malignant neoplasm of breast: Secondary | ICD-10-CM | POA: Insufficient documentation

## 2018-05-10 DIAGNOSIS — Z780 Asymptomatic menopausal state: Secondary | ICD-10-CM | POA: Diagnosis not present

## 2018-05-10 DIAGNOSIS — M8588 Other specified disorders of bone density and structure, other site: Secondary | ICD-10-CM | POA: Insufficient documentation

## 2018-06-25 DIAGNOSIS — M5416 Radiculopathy, lumbar region: Secondary | ICD-10-CM | POA: Diagnosis not present

## 2018-07-01 DIAGNOSIS — H43393 Other vitreous opacities, bilateral: Secondary | ICD-10-CM | POA: Diagnosis not present

## 2018-07-01 DIAGNOSIS — H43811 Vitreous degeneration, right eye: Secondary | ICD-10-CM | POA: Diagnosis not present

## 2018-07-12 ENCOUNTER — Other Ambulatory Visit (HOSPITAL_COMMUNITY): Payer: Self-pay | Admitting: Orthopedic Surgery

## 2018-07-12 DIAGNOSIS — M5416 Radiculopathy, lumbar region: Secondary | ICD-10-CM

## 2018-07-19 ENCOUNTER — Other Ambulatory Visit: Payer: Self-pay | Admitting: Physician Assistant

## 2018-07-20 ENCOUNTER — Inpatient Hospital Stay (HOSPITAL_COMMUNITY): Admission: RE | Admit: 2018-07-20 | Payer: Self-pay | Source: Ambulatory Visit

## 2018-07-20 ENCOUNTER — Ambulatory Visit (HOSPITAL_COMMUNITY)
Admission: RE | Admit: 2018-07-20 | Discharge: 2018-07-20 | Disposition: A | Discharge status: Home / Self Care | Payer: PRIVATE HEALTH INSURANCE | Source: Ambulatory Visit | Attending: Orthopedic Surgery | Admitting: Orthopedic Surgery

## 2018-07-20 ENCOUNTER — Ambulatory Visit (HOSPITAL_COMMUNITY): Admission: RE | Admit: 2018-07-20 | Payer: Self-pay | Source: Ambulatory Visit

## 2018-07-20 DIAGNOSIS — M5137 Other intervertebral disc degeneration, lumbosacral region: Secondary | ICD-10-CM | POA: Diagnosis not present

## 2018-07-20 DIAGNOSIS — M5416 Radiculopathy, lumbar region: Secondary | ICD-10-CM | POA: Diagnosis present

## 2018-07-20 DIAGNOSIS — M5126 Other intervertebral disc displacement, lumbar region: Secondary | ICD-10-CM | POA: Insufficient documentation

## 2018-07-20 MED ORDER — LIDOCAINE HCL (PF) 1 % IJ SOLN
5.0000 mL | Freq: Once | INTRAMUSCULAR | Status: AC
  Administered 2018-07-20: 5 mL via INTRADERMAL

## 2018-07-20 MED ORDER — ONDANSETRON HCL 4 MG/2ML IJ SOLN: 4.0000 mg | Freq: Four times a day (QID) | INTRAMUSCULAR | Status: DC | PRN

## 2018-07-20 MED ORDER — ACETAMINOPHEN-CODEINE #3 300-30 MG PO TABS
1.0000 | ORAL_TABLET | Freq: Once | ORAL | Status: AC
  Administered 2018-07-20: 1 via ORAL
  Filled 2018-07-20: qty 1

## 2018-07-20 MED ORDER — DIAZEPAM 5 MG PO TABS
ORAL_TABLET | ORAL | Status: AC
  Administered 2018-07-20: 10 mg via ORAL
  Filled 2018-07-20: qty 2

## 2018-07-20 MED ORDER — IOPAMIDOL (ISOVUE-M 200) INJECTION 41%
20.0000 mL | Freq: Once | INTRAMUSCULAR | Status: AC
  Administered 2018-07-20: 15 mL via INTRATHECAL

## 2018-07-20 MED ORDER — DIAZEPAM 5 MG PO TABS
10.0000 mg | ORAL_TABLET | Freq: Once | ORAL | Status: AC
  Administered 2018-07-20: 10 mg via ORAL
  Filled 2018-07-20: qty 2

## 2018-07-20 NOTE — Procedures (Signed)
Lumbar myelogram performed without immediate complication. Puncture at L2-3. 15 mL of Isovue-M 200 administered.

## 2018-07-20 NOTE — Discharge Instructions (Signed)
Myelogram, Care After Refer to this sheet in the next few weeks. These instructions provide you with information about caring for yourself after your procedure. Your health care provider may also give you more specific instructions. Your treatment has been planned according to current medical practices, but problems sometimes occur. Call your health care provider if you have any problems or questions after your procedure. What can I expect after the procedure? After the procedure, it is common to have:  Soreness at your injection site.  A mild headache.  Follow these instructions at home:  Drink enough fluid to keep your urine clear or pale yellow. This will help flush out the dye (contrast material) from your spine.  Rest as told by your health care provider. Lie flat with your head slightly raised (elevated) to reduce the risk of headache.  Do not bend, lift, or do any strenuous activity for 24-48 hours or as told by your health care provider.  Take over-the-counter and prescription medicines only as told by your health care provider.  Take care of and remove your bandage (dressing) as told by your health care provider. May remove dressing in 24 hours. Then you may shower.  Bathe or shower as told by your health care provider. Contact a health care provider if:  You have a fever.  You have a headache that lasts longer than 24 hours.  You feel nauseous or vomit.  You have a stiff neck or numbness in your legs.  You are unable to urinate or have a bowel movement.  You develop a rash, itching, or sneezing. Get help right away if:  You have new symptoms or your symptoms get worse.  You have a seizure.  You have trouble breathing. This information is not intended to replace advice given to you by your health care provider. Make sure you discuss any questions you have with your health care provider. Document Released: 08/24/2015 Document Revised: 01/03/2016 Document Reviewed:  05/10/2015 Elsevier Interactive Patient Education  [employer].

## 2018-07-29 DIAGNOSIS — H5319 Other subjective visual disturbances: Secondary | ICD-10-CM | POA: Diagnosis not present

## 2018-08-13 DIAGNOSIS — G4733 Obstructive sleep apnea (adult) (pediatric): Secondary | ICD-10-CM | POA: Diagnosis not present

## 2018-08-17 DIAGNOSIS — R05 Cough: Secondary | ICD-10-CM | POA: Diagnosis not present

## 2018-08-17 DIAGNOSIS — G4733 Obstructive sleep apnea (adult) (pediatric): Secondary | ICD-10-CM | POA: Diagnosis not present

## 2018-08-17 MED FILL — SHIPPING COST: 1 days supply | Qty: 1 | Fill #3

## 2018-08-17 MED FILL — ATENOLOL 25 MG TABLET: 25 | 90 days supply | Qty: 90 | Fill #0

## 2018-10-05 MED FILL — SHIPPING COST: 1 days supply | Qty: 1 | Fill #4

## 2018-10-05 MED FILL — AMITRIPTYLINE HCL 50 MG TAB: 50 | 90 days supply | Qty: 90 | Fill #1

## 2018-10-19 DIAGNOSIS — H01006 Unspecified blepharitis left eye, unspecified eyelid: Secondary | ICD-10-CM | POA: Diagnosis not present

## 2018-10-19 DIAGNOSIS — M791 Myalgia, unspecified site: Secondary | ICD-10-CM | POA: Diagnosis not present

## 2018-10-19 DIAGNOSIS — J069 Acute upper respiratory infection, unspecified: Secondary | ICD-10-CM | POA: Diagnosis not present

## 2018-10-19 DIAGNOSIS — J029 Acute pharyngitis, unspecified: Secondary | ICD-10-CM | POA: Diagnosis not present

## 2018-11-05 MED FILL — ATENOLOL 25 MG TABLET: 25 | 90 days supply | Qty: 90 | Fill #1

## 2018-11-11 DIAGNOSIS — S99922A Unspecified injury of left foot, initial encounter: Secondary | ICD-10-CM | POA: Diagnosis not present

## 2018-11-11 DIAGNOSIS — S93505A Unspecified sprain of left lesser toe(s), initial encounter: Secondary | ICD-10-CM | POA: Diagnosis not present

## 2018-12-28 MED FILL — AMITRIPTYLINE HCL 50 MG TAB: 50 | 90 days supply | Qty: 90 | Fill #0

## 2019-01-11 DIAGNOSIS — Z111 Encounter for screening for respiratory tuberculosis: Secondary | ICD-10-CM | POA: Diagnosis not present

## 2019-02-04 MED FILL — ATENOLOL 25 MG TABLET: 25 | 90 days supply | Qty: 90 | Fill #2

## 2019-02-14 MED FILL — BACLOFEN 10 MG TABS: 10 | 30 days supply | Qty: 90 | Fill #0

## 2019-03-10 MED FILL — BACLOFEN 10 MG TABS: 10 | 30 days supply | Qty: 90 | Fill #0

## 2019-03-23 MED FILL — AMITRIPTYLINE HCL 50 MG TAB: 50 | 90 days supply | Qty: 90 | Fill #0

## 2019-04-04 MED FILL — BACLOFEN 10 MG TABS: 10 | 30 days supply | Qty: 90 | Fill #0

## 2019-04-14 DIAGNOSIS — H04129 Dry eye syndrome of unspecified lacrimal gland: Secondary | ICD-10-CM | POA: Diagnosis not present

## 2019-04-14 DIAGNOSIS — Z8261 Family history of arthritis: Secondary | ICD-10-CM | POA: Diagnosis not present

## 2019-04-14 DIAGNOSIS — Z8619 Personal history of other infectious and parasitic diseases: Secondary | ICD-10-CM | POA: Diagnosis not present

## 2019-04-14 DIAGNOSIS — M25542 Pain in joints of left hand: Secondary | ICD-10-CM | POA: Diagnosis not present

## 2019-04-14 DIAGNOSIS — R5383 Other fatigue: Secondary | ICD-10-CM | POA: Diagnosis not present

## 2019-04-14 DIAGNOSIS — R200 Anesthesia of skin: Secondary | ICD-10-CM | POA: Diagnosis not present

## 2019-04-14 DIAGNOSIS — M25541 Pain in joints of right hand: Secondary | ICD-10-CM | POA: Diagnosis not present

## 2019-04-14 DIAGNOSIS — R202 Paresthesia of skin: Secondary | ICD-10-CM | POA: Diagnosis not present

## 2019-05-06 MED FILL — ATENOLOL 25 MG TABLET: 25 | 90 days supply | Qty: 90 | Fill #3

## 2019-06-07 DIAGNOSIS — R252 Cramp and spasm: Secondary | ICD-10-CM | POA: Diagnosis not present

## 2019-06-07 DIAGNOSIS — G4700 Insomnia, unspecified: Secondary | ICD-10-CM | POA: Diagnosis not present

## 2019-06-07 DIAGNOSIS — I10 Essential (primary) hypertension: Secondary | ICD-10-CM | POA: Diagnosis not present

## 2019-06-15 MED FILL — AMITRIPTYLINE HCL 50 MG TAB: 50 | 90 days supply | Qty: 90 | Fill #0

## 2019-06-16 DIAGNOSIS — R1010 Upper abdominal pain, unspecified: Secondary | ICD-10-CM | POA: Diagnosis not present

## 2019-06-16 DIAGNOSIS — I10 Essential (primary) hypertension: Secondary | ICD-10-CM | POA: Diagnosis not present

## 2019-06-16 DIAGNOSIS — R42 Dizziness and giddiness: Secondary | ICD-10-CM | POA: Diagnosis not present

## 2019-06-16 DIAGNOSIS — G4700 Insomnia, unspecified: Secondary | ICD-10-CM | POA: Diagnosis not present

## 2019-06-16 DIAGNOSIS — M1380 Other specified arthritis, unspecified site: Secondary | ICD-10-CM | POA: Diagnosis not present

## 2019-06-16 DIAGNOSIS — E782 Mixed hyperlipidemia: Secondary | ICD-10-CM | POA: Diagnosis not present

## 2019-06-16 DIAGNOSIS — G4733 Obstructive sleep apnea (adult) (pediatric): Secondary | ICD-10-CM | POA: Diagnosis not present

## 2019-06-16 DIAGNOSIS — R252 Cramp and spasm: Secondary | ICD-10-CM | POA: Diagnosis not present

## 2019-06-16 DIAGNOSIS — Z0000 Encounter for general adult medical examination without abnormal findings: Secondary | ICD-10-CM | POA: Diagnosis not present

## 2019-06-16 DIAGNOSIS — R05 Cough: Secondary | ICD-10-CM | POA: Diagnosis not present

## 2019-06-16 DIAGNOSIS — R1111 Vomiting without nausea: Secondary | ICD-10-CM | POA: Diagnosis not present

## 2019-06-16 DIAGNOSIS — Z7689 Persons encountering health services in other specified circumstances: Secondary | ICD-10-CM | POA: Diagnosis not present

## 2019-08-04 MED FILL — ATENOLOL 25 MG TABLET: 25 | 90 days supply | Qty: 90 | Fill #0

## 2019-09-07 ENCOUNTER — Other Ambulatory Visit (HOSPITAL_COMMUNITY): Payer: Self-pay | Admitting: Internal Medicine

## 2019-09-07 MED FILL — AMITRIPTYLINE HCL 50 MG TAB: 50 | 90 days supply | Qty: 90 | Fill #0

## 2019-09-20 DIAGNOSIS — G4733 Obstructive sleep apnea (adult) (pediatric): Secondary | ICD-10-CM | POA: Diagnosis not present

## 2019-09-27 DIAGNOSIS — G4733 Obstructive sleep apnea (adult) (pediatric): Secondary | ICD-10-CM | POA: Diagnosis not present

## 2019-10-06 DIAGNOSIS — R252 Cramp and spasm: Secondary | ICD-10-CM | POA: Diagnosis not present

## 2019-10-06 DIAGNOSIS — I10 Essential (primary) hypertension: Secondary | ICD-10-CM | POA: Diagnosis not present

## 2019-10-06 DIAGNOSIS — R05 Cough: Secondary | ICD-10-CM | POA: Diagnosis not present

## 2019-10-06 DIAGNOSIS — G4733 Obstructive sleep apnea (adult) (pediatric): Secondary | ICD-10-CM | POA: Diagnosis not present

## 2019-10-06 DIAGNOSIS — Z0000 Encounter for general adult medical examination without abnormal findings: Secondary | ICD-10-CM | POA: Diagnosis not present

## 2019-10-06 DIAGNOSIS — M1380 Other specified arthritis, unspecified site: Secondary | ICD-10-CM | POA: Diagnosis not present

## 2019-10-06 DIAGNOSIS — E782 Mixed hyperlipidemia: Secondary | ICD-10-CM | POA: Diagnosis not present

## 2019-10-06 DIAGNOSIS — R42 Dizziness and giddiness: Secondary | ICD-10-CM | POA: Diagnosis not present

## 2019-10-06 DIAGNOSIS — G4700 Insomnia, unspecified: Secondary | ICD-10-CM | POA: Diagnosis not present

## 2019-10-11 DIAGNOSIS — G4733 Obstructive sleep apnea (adult) (pediatric): Secondary | ICD-10-CM | POA: Diagnosis not present

## 2019-10-11 DIAGNOSIS — Z0000 Encounter for general adult medical examination without abnormal findings: Secondary | ICD-10-CM | POA: Diagnosis not present

## 2019-10-11 DIAGNOSIS — I10 Essential (primary) hypertension: Secondary | ICD-10-CM | POA: Diagnosis not present

## 2019-10-11 DIAGNOSIS — E663 Overweight: Secondary | ICD-10-CM | POA: Diagnosis not present

## 2019-10-11 DIAGNOSIS — G4700 Insomnia, unspecified: Secondary | ICD-10-CM | POA: Diagnosis not present

## 2019-10-11 DIAGNOSIS — R945 Abnormal results of liver function studies: Secondary | ICD-10-CM | POA: Diagnosis not present

## 2019-10-11 DIAGNOSIS — E782 Mixed hyperlipidemia: Secondary | ICD-10-CM | POA: Diagnosis not present

## 2019-10-11 DIAGNOSIS — Z6828 Body mass index (BMI) 28.0-28.9, adult: Secondary | ICD-10-CM | POA: Diagnosis not present

## 2019-10-27 MED FILL — ATENOLOL 25 MG TABLET: 25 | 90 days supply | Qty: 90 | Fill #1

## 2019-10-31 ENCOUNTER — Other Ambulatory Visit (HOSPITAL_COMMUNITY): Payer: Self-pay | Admitting: Internal Medicine

## 2019-10-31 DIAGNOSIS — Z1231 Encounter for screening mammogram for malignant neoplasm of breast: Secondary | ICD-10-CM

## 2019-11-14 ENCOUNTER — Ambulatory Visit (HOSPITAL_COMMUNITY): Payer: 59

## 2019-11-19 DIAGNOSIS — G4733 Obstructive sleep apnea (adult) (pediatric): Secondary | ICD-10-CM | POA: Diagnosis not present

## 2019-11-29 MED FILL — BACLOFEN 10 MG TABS: 10 | 30 days supply | Qty: 90 | Fill #0

## 2019-12-01 MED FILL — AMITRIPTYLINE HCL 50 MG TAB: 50 | 90 days supply | Qty: 90 | Fill #0

## 2019-12-05 DIAGNOSIS — H524 Presbyopia: Secondary | ICD-10-CM | POA: Diagnosis not present

## 2019-12-19 DIAGNOSIS — G4733 Obstructive sleep apnea (adult) (pediatric): Secondary | ICD-10-CM | POA: Diagnosis not present

## 2020-01-19 DIAGNOSIS — Z23 Encounter for immunization: Secondary | ICD-10-CM | POA: Diagnosis not present

## 2020-01-19 DIAGNOSIS — Z0130 Encounter for examination of blood pressure without abnormal findings: Secondary | ICD-10-CM | POA: Diagnosis not present

## 2020-01-19 DIAGNOSIS — S90414A Abrasion, right lesser toe(s), initial encounter: Secondary | ICD-10-CM | POA: Diagnosis not present

## 2020-01-19 DIAGNOSIS — B373 Candidiasis of vulva and vagina: Secondary | ICD-10-CM | POA: Diagnosis not present

## 2020-01-23 DIAGNOSIS — S90411A Abrasion, right great toe, initial encounter: Secondary | ICD-10-CM | POA: Diagnosis not present

## 2020-01-23 DIAGNOSIS — S90414A Abrasion, right lesser toe(s), initial encounter: Secondary | ICD-10-CM | POA: Diagnosis not present

## 2020-01-23 DIAGNOSIS — S9031XA Contusion of right foot, initial encounter: Secondary | ICD-10-CM | POA: Diagnosis not present

## 2020-01-25 MED FILL — ATENOLOL 25 MG TABLET: 25 | 90 days supply | Qty: 90 | Fill #2

## 2020-02-23 MED FILL — AMITRIPTYLINE HCL 50 MG TAB: 50 | 90 days supply | Qty: 90 | Fill #0

## 2020-03-01 MED FILL — BACLOFEN 10 MG TABS: 10 | 30 days supply | Qty: 90 | Fill #0

## 2020-03-26 MED FILL — BACLOFEN 10 MG TABS: 10 | 30 days supply | Qty: 90 | Fill #0

## 2020-04-18 DIAGNOSIS — R05 Cough: Secondary | ICD-10-CM | POA: Diagnosis not present

## 2020-04-18 DIAGNOSIS — I10 Essential (primary) hypertension: Secondary | ICD-10-CM | POA: Diagnosis not present

## 2020-04-18 DIAGNOSIS — R252 Cramp and spasm: Secondary | ICD-10-CM | POA: Diagnosis not present

## 2020-04-18 DIAGNOSIS — E782 Mixed hyperlipidemia: Secondary | ICD-10-CM | POA: Diagnosis not present

## 2020-04-18 DIAGNOSIS — R42 Dizziness and giddiness: Secondary | ICD-10-CM | POA: Diagnosis not present

## 2020-04-18 DIAGNOSIS — Z0000 Encounter for general adult medical examination without abnormal findings: Secondary | ICD-10-CM | POA: Diagnosis not present

## 2020-04-18 DIAGNOSIS — E663 Overweight: Secondary | ICD-10-CM | POA: Diagnosis not present

## 2020-04-18 DIAGNOSIS — R945 Abnormal results of liver function studies: Secondary | ICD-10-CM | POA: Diagnosis not present

## 2020-04-18 DIAGNOSIS — G4733 Obstructive sleep apnea (adult) (pediatric): Secondary | ICD-10-CM | POA: Diagnosis not present

## 2020-04-20 MED FILL — BACLOFEN 10 MG TABS: 10 | 30 days supply | Qty: 90 | Fill #0

## 2020-04-23 DIAGNOSIS — I10 Essential (primary) hypertension: Secondary | ICD-10-CM | POA: Diagnosis not present

## 2020-04-23 DIAGNOSIS — Z6828 Body mass index (BMI) 28.0-28.9, adult: Secondary | ICD-10-CM | POA: Diagnosis not present

## 2020-04-23 DIAGNOSIS — R945 Abnormal results of liver function studies: Secondary | ICD-10-CM | POA: Diagnosis not present

## 2020-04-23 DIAGNOSIS — G4733 Obstructive sleep apnea (adult) (pediatric): Secondary | ICD-10-CM | POA: Diagnosis not present

## 2020-04-23 DIAGNOSIS — Z0000 Encounter for general adult medical examination without abnormal findings: Secondary | ICD-10-CM | POA: Diagnosis not present

## 2020-04-23 DIAGNOSIS — G4700 Insomnia, unspecified: Secondary | ICD-10-CM | POA: Diagnosis not present

## 2020-04-23 DIAGNOSIS — Z86010 Personal history of colonic polyps: Secondary | ICD-10-CM | POA: Diagnosis not present

## 2020-04-23 DIAGNOSIS — E782 Mixed hyperlipidemia: Secondary | ICD-10-CM | POA: Diagnosis not present

## 2020-04-23 DIAGNOSIS — E663 Overweight: Secondary | ICD-10-CM | POA: Diagnosis not present

## 2020-04-24 ENCOUNTER — Other Ambulatory Visit (HOSPITAL_COMMUNITY): Payer: Self-pay | Admitting: Internal Medicine

## 2020-04-24 MED FILL — ATENOLOL 25 MG TABLET: 25 | 90 days supply | Qty: 90 | Fill #0

## 2020-05-14 MED FILL — BACLOFEN 10 MG TABS: 10 | 30 days supply | Qty: 90 | Fill #0

## 2020-05-18 MED FILL — AMITRIPTYLINE HCL 50 MG TAB: 50 | 90 days supply | Qty: 90 | Fill #1

## 2020-05-21 DIAGNOSIS — U071 COVID-19: Secondary | ICD-10-CM | POA: Diagnosis not present

## 2020-06-07 MED FILL — BACLOFEN 10 MG TABS: 10 | 30 days supply | Qty: 90 | Fill #0

## 2020-07-02 MED FILL — BACLOFEN 10 MG TABS: 10 | 30 days supply | Qty: 90 | Fill #0

## 2020-07-17 MED FILL — ATENOLOL 25 MG TABLET: 25 | 90 days supply | Qty: 90 | Fill #1

## 2020-07-18 DIAGNOSIS — G4733 Obstructive sleep apnea (adult) (pediatric): Secondary | ICD-10-CM | POA: Diagnosis not present

## 2020-07-18 DIAGNOSIS — Z23 Encounter for immunization: Secondary | ICD-10-CM | POA: Diagnosis not present

## 2020-07-18 DIAGNOSIS — R1111 Vomiting without nausea: Secondary | ICD-10-CM | POA: Diagnosis not present

## 2020-07-18 DIAGNOSIS — R945 Abnormal results of liver function studies: Secondary | ICD-10-CM | POA: Diagnosis not present

## 2020-07-18 DIAGNOSIS — R252 Cramp and spasm: Secondary | ICD-10-CM | POA: Diagnosis not present

## 2020-07-18 DIAGNOSIS — M1380 Other specified arthritis, unspecified site: Secondary | ICD-10-CM | POA: Diagnosis not present

## 2020-07-18 DIAGNOSIS — I10 Essential (primary) hypertension: Secondary | ICD-10-CM | POA: Diagnosis not present

## 2020-07-18 DIAGNOSIS — R42 Dizziness and giddiness: Secondary | ICD-10-CM | POA: Diagnosis not present

## 2020-07-18 DIAGNOSIS — E782 Mixed hyperlipidemia: Secondary | ICD-10-CM | POA: Diagnosis not present

## 2020-07-18 DIAGNOSIS — G4700 Insomnia, unspecified: Secondary | ICD-10-CM | POA: Diagnosis not present

## 2020-07-26 MED FILL — BACLOFEN 10 MG TABS: 10 | 30 days supply | Qty: 90 | Fill #1

## 2020-08-15 MED FILL — AMITRIPTYLINE HCL 50 MG TAB: 50 | 90 days supply | Qty: 90 | Fill #2

## 2020-08-25 MED FILL — BACLOFEN 10 MG TABS: 10 | 30 days supply | Qty: 90 | Fill #2

## 2020-08-29 MED FILL — ATENOLOL 25 MG TABLET: 25 | 90 days supply | Qty: 90 | Fill #2

## 2020-09-24 ENCOUNTER — Other Ambulatory Visit (HOSPITAL_COMMUNITY): Payer: Self-pay | Admitting: Internal Medicine

## 2020-09-25 MED FILL — BACLOFEN 10 MG TABS: 10 | 30 days supply | Qty: 90 | Fill #0

## 2020-10-19 MED FILL — BACLOFEN 10 MG TABS: 10 | 30 days supply | Qty: 90 | Fill #1

## 2020-10-29 ENCOUNTER — Other Ambulatory Visit (HOSPITAL_BASED_OUTPATIENT_CLINIC_OR_DEPARTMENT_OTHER): Payer: Self-pay

## 2020-11-06 ENCOUNTER — Encounter (INDEPENDENT_AMBULATORY_CARE_PROVIDER_SITE_OTHER): Payer: Self-pay | Admitting: *Deleted

## 2020-11-07 ENCOUNTER — Other Ambulatory Visit (HOSPITAL_COMMUNITY): Payer: Self-pay

## 2020-12-17 ENCOUNTER — Other Ambulatory Visit: Payer: Self-pay

## 2020-12-17 ENCOUNTER — Ambulatory Visit (HOSPITAL_COMMUNITY)
Admission: RE | Admit: 2020-12-17 | Discharge: 2020-12-17 | Disposition: A | Discharge status: Home / Self Care | Payer: No Typology Code available for payment source | Source: Ambulatory Visit | Attending: Internal Medicine | Admitting: Internal Medicine

## 2020-12-17 DIAGNOSIS — Z1231 Encounter for screening mammogram for malignant neoplasm of breast: Secondary | ICD-10-CM | POA: Diagnosis not present

## 2020-12-26 ENCOUNTER — Other Ambulatory Visit (HOSPITAL_COMMUNITY): Payer: Self-pay | Admitting: Internal Medicine

## 2020-12-26 ENCOUNTER — Other Ambulatory Visit (HOSPITAL_COMMUNITY): Payer: Self-pay | Admitting: Family Medicine

## 2020-12-27 ENCOUNTER — Other Ambulatory Visit (HOSPITAL_COMMUNITY): Payer: Self-pay | Admitting: Internal Medicine

## 2020-12-27 ENCOUNTER — Other Ambulatory Visit (HOSPITAL_COMMUNITY): Payer: Self-pay | Admitting: Family Medicine

## 2020-12-27 ENCOUNTER — Other Ambulatory Visit (HOSPITAL_COMMUNITY): Payer: Self-pay | Admitting: Student

## 2020-12-27 DIAGNOSIS — R928 Other abnormal and inconclusive findings on diagnostic imaging of breast: Secondary | ICD-10-CM

## 2021-01-01 ENCOUNTER — Other Ambulatory Visit: Payer: Self-pay

## 2021-01-01 ENCOUNTER — Ambulatory Visit (HOSPITAL_COMMUNITY)
Admission: RE | Admit: 2021-01-01 | Discharge: 2021-01-01 | Disposition: A | Discharge status: Home / Self Care | Payer: PRIVATE HEALTH INSURANCE | Source: Ambulatory Visit | Attending: Internal Medicine | Admitting: Internal Medicine

## 2021-01-01 DIAGNOSIS — R928 Other abnormal and inconclusive findings on diagnostic imaging of breast: Secondary | ICD-10-CM | POA: Diagnosis present

## 2021-02-20 DIAGNOSIS — I10 Essential (primary) hypertension: Secondary | ICD-10-CM | POA: Insufficient documentation

## 2021-06-05 DIAGNOSIS — G4733 Obstructive sleep apnea (adult) (pediatric): Secondary | ICD-10-CM | POA: Insufficient documentation

## 2021-06-05 DIAGNOSIS — F5104 Psychophysiologic insomnia: Secondary | ICD-10-CM | POA: Insufficient documentation

## 2021-06-05 DIAGNOSIS — G250 Essential tremor: Secondary | ICD-10-CM | POA: Insufficient documentation

## 2021-10-21 DIAGNOSIS — E785 Hyperlipidemia, unspecified: Secondary | ICD-10-CM | POA: Insufficient documentation

## 2021-12-18 DIAGNOSIS — R053 Chronic cough: Secondary | ICD-10-CM | POA: Insufficient documentation

## 2022-04-30 DIAGNOSIS — R600 Localized edema: Secondary | ICD-10-CM | POA: Insufficient documentation

## 2022-07-29 DIAGNOSIS — M545 Low back pain, unspecified: Secondary | ICD-10-CM | POA: Insufficient documentation

## 2022-11-26 DIAGNOSIS — G43109 Migraine with aura, not intractable, without status migrainosus: Secondary | ICD-10-CM | POA: Insufficient documentation

## 2022-12-24 DIAGNOSIS — M7041 Prepatellar bursitis, right knee: Secondary | ICD-10-CM | POA: Insufficient documentation

## 2022-12-31 DIAGNOSIS — G43709 Chronic migraine without aura, not intractable, without status migrainosus: Secondary | ICD-10-CM | POA: Insufficient documentation

## 2023-06-09 DIAGNOSIS — J479 Bronchiectasis, uncomplicated: Secondary | ICD-10-CM | POA: Insufficient documentation

## 2023-08-16 NOTE — Progress Notes (Signed)
 Melissa Blackburn, female    DOB: 09-May-1956    MRN: [id number]   Brief patient profile:  18  yowf NP  never smoker  referred to pulmonary clinic in [place]  08/17/2023 by Katheryn Birmingham MD  for new cough x summer of 2024 with dx bronchiectasis  05/29/23    History of Present Illness  08/17/2023  Pulmonary/ 1st office eval/ [name] / [place] [facility]  Chief Complaint  Patient presents with   Establish Care   Cough  Dyspnea:  very active hunts / walks up hills  More difficult carrying groceries up steps  Cough: daily esp in am x non-productive and persists daytime and then hs  Sleep: electric bed x 10 degrees and cpap and sometimes coughing SABA use: clear mucus albuterol   02: none  LDSCT:n/a  Omeprazole started  before cough for overt HB at a year prior to OV  and resolved.  No obvious day to day or daytime pattern/variability or assoc excess/ purulent sputum or mucus plugs or hemoptysis or cp or chest tightness, subjective wheeze or overt sinus or ongoing hb symptoms.    Also denies any obvious fluctuation of symptoms with weather or environmental changes or other aggravating or alleviating factors except as outlined above   No unusual exposure hx or h/o childhood pna/ asthma or knowledge of premature birth.  Current Allergies, Complete Past Medical History, Past Surgical History, Family History, and Social History were reviewed in [employer] record.  ROS  The following are not active complaints unless bolded Hoarseness, sore throat, dysphagia, dental problems, itching, sneezing,  nasal congestion or discharge of excess mucus or purulent secretions, ear ache,   fever, chills, sweats, unintended wt loss or wt gain, classically pleuritic or exertional cp,  orthopnea pnd or arm/hand swelling  or leg swelling, presyncope, palpitations, abdominal pain, anorexia, nausea, vomiting, diarrhea  or change in bowel habits or change in bladder habits, change in stools or change  in urine, dysuria, hematuria,  rash, arthralgias, visual complaints, headache, numbness, weakness or ataxia or problems with walking or coordination,  change in mood or  memory.            Outpatient Medications Prior to Visit  Medication Sig Dispense Refill   albuterol  (VENTOLIN  HFA) 108 (90 Base) MCG/ACT inhaler Inhale 2 puffs into the lungs every 6 (six) hours as needed.     amitriptyline  (ELAVIL ) 50 MG tablet Take 50 mg by mouth at bedtime. For migraines.     baclofen (LIORESAL) 10 MG tablet Take 10 mg by mouth 3 (three) times daily.     losartan (COZAAR) 50 MG tablet Take 50 mg by mouth daily.     promethazine  (PHENERGAN ) 25 MG tablet Take 25 mg by mouth every 6 (six) hours as needed for nausea or vomiting.     rizatriptan (MAXALT) 10 MG tablet Take 10 mg by mouth as needed.     sodium chloride  HYPERTONIC 3 % nebulizer solution Take 4 mLs by nebulization as needed.     traZODone (DESYREL) 50 MG tablet Take 50 mg by mouth at bedtime.     Acetaminophen -Codeine  (TYLENOL /CODEINE  #3) 300-30 MG tablet Take 1 tablet by mouth every 4 (four) hours as needed for pain.     amitriptyline  (ELAVIL ) 50 MG tablet TAKE 1 TABLET BY MOUTH NIGHTLY AS NEEDED 90 tablet 3   atenolol  (TENORMIN ) 25 MG tablet Take 25 mg by mouth at bedtime.   5   atenolol  (TENORMIN ) 25 MG tablet TAKE  1 TABLET BY MOUTH DAILY 90 tablet 2   carisoprodol  (SOMA ) 350 MG tablet Take 350 mg by mouth at bedtime.  0   Omega-3 1000 MG CAPS Take 1 capsule by mouth daily.     No facility-administered medications prior to visit.    Past Medical History:  Diagnosis Date   Anemia    hx of   Asthma    adult onset (09/11/2016)   DDD (degenerative disc disease)    hands, knees (09/11/2016)   Hypertension    Migraine    none since stimulator placed 07/2016 (09/11/2016)   Needle stick injury with contaminated needle    patient undergoing testing   Numbness and tingling 11/15/2012   OSA on CPAP    cpap 7 (09/11/2016)   Pain in limb  11/15/2012   Paresthesias 11/15/2012   Pneumonia 1980s X 1   PONV (postoperative nausea and vomiting)    severe nausea and vomiting   Weakness generalized 11/15/2012      Objective:     BP 130/72   Pulse 99   Ht 5' 11 (1.803 m)   Wt 181 lb (82.1 kg)   SpO2 95%   BMI 25.24 kg/m   SpO2: 95 %  Ambulatory pleasant healthy appearing wf nad    HEENT : Oropharynx  clear/ ears clear    NECK :  without  apparent JVD/ palpable Nodes/TM    LUNGS: no acc muscle use,  Nl contour chest which is clear to A and P bilaterally with urge to cough on insp maneuvers   CV:  RRR  no s3 or murmur or increase in P2, and no edema   ABD:  soft and nontender   MS:  Gait nl   ext warm without deformities Or obvious joint restrictions  calf tenderness, cyanosis or clubbing    SKIN: warm and dry without lesions    NEURO:  alert, approp, nl sensorium with  no motor or cerebellar deficits apparent.       Assessment   Bronchiectasis (HCC) See [place] CT cardiac / lung windows  05/29/23   Mild bronchiectasis  -  Quant Ig's / Quant TB /ESR 08/17/2023  -  [facility] AT 08/17/2023   She's had covid x 3 but no imaging during these episodes and no prior h/o childhood as adult or child for that matter so hard to determine origin and w/u above initiated with recs  1) mucinex dm 1200 mg bid prn cough  2) Max gerd rx to prevent cyclical cough from bronchiectasis / gerd  3) return in 6 weeks with pfts on return if possible   DOE (dyspnea on exertion) Onset summer 2024 more difficulty with steps/ hills - Cardiac CT 05/29/23 with low calcium scores, mild ATX / bronchiectasis  - .08/17/2023   Walked on RA  x  3  lap(s) =  approx 450  ft  @ mod pace, stopped due to end of study  with lowest 02 sats 96% and no sob/ cp    Symptoms are   disproportionate to objective findings and not clear to what extent this is actually a pulmonary  problem but pt does appear to have difficult to sort out respiratory symptoms of  unknown origin for which  DDX  = almost all start with A and  include Adherence, Ace Inhibitors, Acid Reflux, Active Sinus Disease, Alpha 1 Antitripsin deficiency, Anxiety masquerading as Airways dz,  ABPA,  Allergy(esp in young), Aspiration (esp in elderly), Adverse effects of  meds,  Active smoking or Vaping, A bunch of PE's/clot burden (a few small clots can't cause this syndrome unless there is already severe underlying pulm or vascular dz with poor reserve),  Anemia or thyroid disorder, plus two Bs  = Bronchiectasis and Beta blocker use..and one C= CHF     Labs done to eval all BOLDED dx's but the Acid reflux concern will be addressed under dx of bronchiectasis.  Re asthma and use of saba: Re SABA :  I spent extra time with pt today reviewing appropriate use of albuterol  for prn use on exertion with the following points: 1) saba is for relief of sob that does not improve by walking a slower pace or resting but rather if the pt does not improve after trying this first. 2) If the pt is convinced, as many are, that saba helps recover from activity faster then it's easy to tell if this is the case by re-challenging : ie stop, take the inhaler, then p 5 minutes try the exact same activity (intensity of workload) that just caused the symptoms and see if they are substantially diminished or not after saba 3) if there is an activity that reproducibly causes the symptoms, try the saba 15 min before the activity on alternate days.  If in fact the saba really does help, then fine to continue to use it prn but advised may need to look closer at the maintenance regimen (for now = 0)  being used to achieve better control of airways disease with exertion.          Each maintenance medication was reviewed in detail including emphasizing most importantly the difference between maintenance and prns and under what circumstances the prns are to be triggered using an action plan format where appropriate.  Total time  for H and P, chart review, counseling, reviewing hfa use,  and generating customized AVS unique to this office visit / same day charting = 45 min new pt eval with symptoms of uncertain etiology (doubt bronchiectasis is the main issue here)               Ozell America, MD 08/17/2023

## 2023-08-17 ENCOUNTER — Encounter: Payer: Self-pay | Admitting: Internal Medicine

## 2023-08-17 ENCOUNTER — Ambulatory Visit (INDEPENDENT_AMBULATORY_CARE_PROVIDER_SITE_OTHER): Payer: Managed Care, Other (non HMO) | Admitting: Internal Medicine

## 2023-08-17 VITALS — BP 130/72 | HR 99 | Ht 71.0 in | Wt 181.0 lb

## 2023-08-17 DIAGNOSIS — J479 Bronchiectasis, uncomplicated: Secondary | ICD-10-CM | POA: Diagnosis not present

## 2023-08-17 DIAGNOSIS — R0609 Other forms of dyspnea: Secondary | ICD-10-CM

## 2023-08-17 MED ORDER — PANTOPRAZOLE SODIUM 40 MG PO TBEC: 40.0000 mg | DELAYED_RELEASE_TABLET | Freq: Every day | ORAL | 2 refills | Status: DC

## 2023-08-17 MED ORDER — FAMOTIDINE 20 MG PO TABS: ORAL_TABLET | ORAL | 11 refills | Status: AC

## 2023-08-17 NOTE — Patient Instructions (Addendum)
 Pantoprazole  (protonix ) 40 mg   Take  30-60 min before first meal of the day and Pepcid  (famotidine )  20 mg after supper until return to office - this is the best way to tell whether stomach acid is contributing to your problem.    GERD (REFLUX)  is an extremely common cause of respiratory symptoms just like yours , many times with no obvious heartburn at all.    It can be treated with medication, but also with lifestyle changes including elevation of the head of your bed (ideally with 6 -8inch blocks under the headboard of your bed),  Smoking cessation, avoidance of late meals, excessive alcohol, and avoid fatty foods, chocolate, peppermint, colas, red wine, and acidic juices such as orange juice.  NO MINT OR MENTHOL  PRODUCTS SO NO COUGH DROPS  USE SUGARLESS CANDY INSTEAD (Jolley ranchers or Stover's or Life Savers) or even ice chips will also do - the key is to swallow to prevent all throat clearing. NO OIL BASED VITAMINS - use powdered substitutes.  Avoid fish oil when coughing.    For cough > mucinex dm 1200 mg every 12 hours as needed  Also  Ok to try albuterol  15 min before an activity (on alternating days)  that you know would usually make you short of breath and see if it makes any difference and if makes none then don't take albuterol  after activity unless you can't catch your breath as this means it's the resting that helps, not the albuterol .     Please remember to go to the lab department   for your tests - we will call you with the results when they are available.  Please schedule a follow up office visit in 6 weeks, call sooner if needed with PFTs on return

## 2023-08-17 NOTE — Assessment & Plan Note (Signed)
 Onset summer 2024 more difficulty with steps/ hills - Cardiac CT 05/29/23 with low calcium scores, mild ATX / bronchiectasis  - .08/17/2023   Walked on RA  x  3  lap(s) =  approx 450  ft  @ mod pace, stopped due to end of study  with lowest 02 sats 96% and no sob/ cp    Symptoms are   disproportionate to objective findings and not clear to what extent this is actually a pulmonary  problem but pt does appear to have difficult to sort out respiratory symptoms of unknown origin for which  DDX  = almost all start with A and  include Adherence, Ace Inhibitors, Acid Reflux, Active Sinus Disease, Alpha 1 Antitripsin deficiency, Anxiety masquerading as Airways dz,  ABPA,  Allergy(esp in young), Aspiration (esp in elderly), Adverse effects of meds,  Active smoking or Vaping, A bunch of PE's/clot burden (a few small clots can't cause this syndrome unless there is already severe underlying pulm or vascular dz with poor reserve),  Anemia or thyroid disorder, plus two Bs  = Bronchiectasis and Beta blocker use..and one C= CHF     Labs done to eval all BOLDED dx's but the Acid reflux concern will be addressed under dx of bronchiectasis.  Re asthma and use of saba: Re SABA :  I spent extra time with pt today reviewing appropriate use of albuterol  for prn use on exertion with the following points: 1) saba is for relief of sob that does not improve by walking a slower pace or resting but rather if the pt does not improve after trying this first. 2) If the pt is convinced, as many are, that saba helps recover from activity faster then it's easy to tell if this is the case by re-challenging : ie stop, take the inhaler, then p 5 minutes try the exact same activity (intensity of workload) that just caused the symptoms and see if they are substantially diminished or not after saba 3) if there is an activity that reproducibly causes the symptoms, try the saba 15 min before the activity on alternate days.  If in fact the saba  really does help, then fine to continue to use it prn but advised may need to look closer at the maintenance regimen (for now = 0)  being used to achieve better control of airways disease with exertion.          Each maintenance medication was reviewed in detail including emphasizing most importantly the difference between maintenance and prns and under what circumstances the prns are to be triggered using an action plan format where appropriate.  Total time for H and P, chart review, counseling, reviewing hfa use,  and generating customized AVS unique to this office visit / same day charting = 45 min new pt eval with symptoms of uncertain etiology (doubt bronchiectasis is the main issue here)

## 2023-08-17 NOTE — Assessment & Plan Note (Signed)
 See [facility] CT cardiac / lung windows  05/29/23   Mild bronchiectasis  -  Quant Ig's / Quant TB /ESR 08/17/2023  -  Alpha One AT 08/17/2023   She's had covid x 3 but no imaging during these episodes and no prior h/o childhood as adult or child for that matter so hard to determine origin and w/u above initiated with recs  1) mucinex dm 1200 mg bid prn cough  2) Max gerd rx to prevent cyclical cough from bronchiectasis / gerd  3) return in 6 weeks with pfts on return if possible

## 2023-08-20 LAB — SPECIMEN STATUS REPORT

## 2023-09-01 ENCOUNTER — Encounter: Payer: Self-pay | Admitting: *Deleted

## 2023-09-01 LAB — CBC WITH DIFFERENTIAL/PLATELET
Basophils Absolute: 0 10*3/uL (ref 0.0–0.2)
Basos: 1 %
EOS (ABSOLUTE): 0.1 10*3/uL (ref 0.0–0.4)
Eos: 3 %
Hematocrit: 42 % (ref 34.0–46.6)
Hemoglobin: 13.7 g/dL (ref 11.1–15.9)
Immature Grans (Abs): 0 10*3/uL (ref 0.0–0.1)
Immature Granulocytes: 0 %
Lymphocytes Absolute: 1.4 10*3/uL (ref 0.7–3.1)
Lymphs: 40 %
MCH: 30.1 pg (ref 26.6–33.0)
MCHC: 32.6 g/dL (ref 31.5–35.7)
MCV: 92 fL (ref 79–97)
Monocytes Absolute: 0.4 10*3/uL (ref 0.1–0.9)
Monocytes: 11 %
Neutrophils Absolute: 1.6 10*3/uL (ref 1.4–7.0)
Neutrophils: 45 %
Platelets: 255 10*3/uL (ref 150–450)
RBC: 4.55 x10E6/uL (ref 3.77–5.28)
RDW: 12.5 % (ref 11.7–15.4)
WBC: 3.6 10*3/uL (ref 3.4–10.8)

## 2023-09-01 LAB — ALPHA-1-ANTITRYPSIN PHENOTYP: A-1 Antitrypsin: 153 mg/dL (ref 101–187)

## 2023-09-01 LAB — BASIC METABOLIC PANEL
BUN/Creatinine Ratio: 14 (ref 12–28)
BUN: 13 mg/dL (ref 8–27)
CO2: 23 mmol/L (ref 20–29)
Calcium: 9.7 mg/dL (ref 8.7–10.3)
Chloride: 107 mmol/L — ABNORMAL HIGH (ref 96–106)
Creatinine, Ser: 0.9 mg/dL (ref 0.57–1.00)
Glucose: 97 mg/dL (ref 70–99)
Potassium: 4.7 mmol/L (ref 3.5–5.2)
Sodium: 145 mmol/L — ABNORMAL HIGH (ref 134–144)
eGFR: 71 mL/min/{1.73_m2} (ref >59)

## 2023-09-01 LAB — QUANTIFERON-TB GOLD PLUS
QuantiFERON Mitogen Value: >10 [IU]/mL
QuantiFERON Nil Value: 0.02 [IU]/mL
QuantiFERON TB1 Ag Value: 0.03 [IU]/mL
QuantiFERON TB2 Ag Value: 0.02 [IU]/mL
QuantiFERON-TB Gold Plus: NEGATIVE

## 2023-09-01 LAB — BRAIN NATRIURETIC PEPTIDE: BNP: <2.5 pg/mL (ref 0.0–100.0)

## 2023-09-01 LAB — SEDIMENTATION RATE: Sed Rate: 8 mm/h (ref 0–40)

## 2023-09-01 LAB — IGE: IgE (Immunoglobulin E), Serum: 5 [IU]/mL — ABNORMAL LOW (ref 6–495)

## 2023-09-01 LAB — D-DIMER, QUANTITATIVE: D-DIMER: 0.21 mg{FEU}/L (ref 0.00–0.49)

## 2023-10-04 NOTE — Progress Notes (Unsigned)
 Melissa Blackburn, female    DOB: November 01, 1955    MRN: [id number]   Brief patient profile:  83  yowf NP  never smoker  referred to pulmonary clinic in [place]  08/17/2023 by Claiborne Rigg MD  for new cough x summer of 2024 with dx bronchiectasis  05/29/23    History of Present Illness  08/17/2023  Pulmonary/ 1st office eval/ Estefania Kamiya / [facility]  Chief Complaint  Patient presents with   Establish Care   Cough  Dyspnea:  very active hunts / walks up hills  More difficult carrying groceries up steps  Cough: daily esp in am x non-productive and persists daytime and then hs  Sleep: electric bed x 10 degrees and cpap and sometimes coughing SABA use: clear mucus albuterol  02: none  LDSCT:n/a  Omeprazole started  before cough for overt HB at a year prior to OV  and resolved Rec     10/06/2023  f/u ov/[place] office/Roshan Roback re: *** maint on ***  No chief complaint on file.   Dyspnea:  *** Cough: *** Sleeping: ***   resp cc  SABA use: *** 02: ***  Lung cancer screening: ***   No obvious day to day or daytime variability or assoc excess/ purulent sputum or mucus plugs or hemoptysis or cp or chest tightness, subjective wheeze or overt sinus or hb symptoms.    Also denies any obvious fluctuation of symptoms with weather or environmental changes or other aggravating or alleviating factors except as outlined above   No unusual exposure hx or h/o childhood pna/ asthma or knowledge of premature birth.  Current Allergies, Complete Past Medical History, Past Surgical History, Family History, and Social History were reviewed in [employer] record.  ROS  The following are not active complaints unless bolded Hoarseness, sore throat, dysphagia, dental problems, itching, sneezing,  nasal congestion or discharge of excess mucus or purulent secretions, ear ache,   fever, chills, sweats, unintended wt loss or wt gain, classically pleuritic or exertional cp,  orthopnea pnd  or arm/hand swelling  or leg swelling, presyncope, palpitations, abdominal pain, anorexia, nausea, vomiting, diarrhea  or change in bowel habits or change in bladder habits, change in stools or change in urine, dysuria, hematuria,  rash, arthralgias, visual complaints, headache, numbness, weakness or ataxia or problems with walking or coordination,  change in mood or  memory.        No outpatient medications have been marked as taking for the 10/06/23 encounter (Appointment) with Nyoka Cowden, MD.           Past Medical History:  Diagnosis Date   Anemia    hx of   Asthma    "adult onset" (09/11/2016)   DDD (degenerative disc disease)    "hands, knees" (09/11/2016)   Hypertension    Migraine    "none since stimulator placed 07/2016" (09/11/2016)   Needle stick injury with contaminated needle    patient undergoing testing   Numbness and tingling 11/15/2012   OSA on CPAP    cpap 7 (09/11/2016)   Pain in limb 11/15/2012   Paresthesias 11/15/2012   Pneumonia 1980s X 1   PONV (postoperative nausea and vomiting)    "severe nausea and vomiting"   Weakness generalized 11/15/2012      Objective:    wts  10/06/2023         ***   08/17/23 181 lb (82.1 kg)  05/21/17 190 lb (86.2 kg)  09/12/16 192 lb 4.8  oz (87.2 kg)      Vital signs reviewed  10/06/2023  - Note at rest 02 sats  ***% on ***   General appearance:    ***          Assessment

## 2023-10-06 ENCOUNTER — Ambulatory Visit (HOSPITAL_COMMUNITY)
Admission: RE | Admit: 2023-10-06 | Discharge: 2023-10-06 | Disposition: A | Discharge status: Home / Self Care | Payer: Managed Care, Other (non HMO) | Source: Ambulatory Visit | Attending: Internal Medicine | Admitting: Internal Medicine

## 2023-10-06 ENCOUNTER — Ambulatory Visit: Payer: Managed Care, Other (non HMO) | Admitting: Internal Medicine

## 2023-10-06 ENCOUNTER — Encounter: Payer: Self-pay | Admitting: Internal Medicine

## 2023-10-06 VITALS — BP 125/74 | HR 80 | Ht 71.0 in | Wt 182.0 lb

## 2023-10-06 DIAGNOSIS — J479 Bronchiectasis, uncomplicated: Secondary | ICD-10-CM | POA: Diagnosis not present

## 2023-10-06 DIAGNOSIS — R0609 Other forms of dyspnea: Secondary | ICD-10-CM | POA: Insufficient documentation

## 2023-10-06 LAB — PULMONARY FUNCTION TEST
DL/VA % pred: 94 %
DL/VA: 3.78 ml/min/mmHg/L
DLCO unc % pred: 83 %
DLCO unc: 20.57 ml/min/mmHg
FEF 25-75 Post: 3.65 L/s
FEF 25-75 Pre: 2.82 L/s
FEF2575-%Change-Post: 29 %
FEF2575-%Pred-Post: 142 %
FEF2575-%Pred-Pre: 110 %
FEV1-%Change-Post: 6 %
FEV1-%Pred-Post: 104 %
FEV1-%Pred-Pre: 97 %
FEV1-Post: 3.25 L
FEV1-Pre: 3.05 L
FEV1FVC-%Change-Post: 5 %
FEV1FVC-%Pred-Pre: 101 %
FEV6-%Change-Post: 0 %
FEV6-%Pred-Post: 99 %
FEV6-%Pred-Pre: 99 %
FEV6-Post: 3.91 L
FEV6-Pre: 3.89 L
FEV6FVC-%Change-Post: 0 %
FEV6FVC-%Pred-Post: 103 %
FEV6FVC-%Pred-Pre: 103 %
FVC-%Change-Post: 1 %
FVC-%Pred-Post: 96 %
FVC-%Pred-Pre: 95 %
FVC-Post: 3.93 L
FVC-Pre: 3.89 L
Post FEV1/FVC ratio: 83 %
Post FEV6/FVC ratio: 100 %
Pre FEV1/FVC ratio: 78 %
Pre FEV6/FVC Ratio: 100 %
RV % pred: 104 %
RV: 2.54 L
TLC % pred: 106 %
TLC: 6.51 L

## 2023-10-06 MED ORDER — ALBUTEROL SULFATE (2.5 MG/3ML) 0.083% IN NEBU
2.5000 mg | INHALATION_SOLUTION | Freq: Once | RESPIRATORY_TRACT | Status: AC
Start: 2023-10-06 — End: 2023-10-06
  Administered 2023-10-06: 2.5 mg via RESPIRATORY_TRACT

## 2023-10-06 NOTE — Patient Instructions (Signed)
 I would recommend a HRCT chest to complete the workup in 8 months  Also Quantitative Ig's   IgM, Ig A and IgG

## 2023-10-06 NOTE — Assessment & Plan Note (Addendum)
 See [facility] CT cardiac / lung windows  05/29/23  " Mild bronchiectasis"  -   Quant TB  08/17/2023  neg  -  Alpha One AT 08/17/2023  MM  level 153  -  ESR   8  08/17/23 -  Allergy screen  08/17/23  >  Eos 0.1 /  IgE  5 -  Quant Ig's  08/17/23 >>> not done by 10/06/2023 > to be repeated in [place]  - PFTs 10/06/2023   wnl   Cough no better on max gerd rx so ok to go back to otc rx as before  She needs to complete the w/u with HRCT and quant Igs but should do well in the absence of colonization with resistant or opportunistic organisms (MAI) > advised.  Pulmonary f/u can be prn          Each maintenance medication was reviewed in detail including emphasizing most importantly the difference between maintenance and prns and under what circumstances the prns are to be triggered using an action plan format where appropriate.  Total time for H and P, chart review, counseling, and generating customized AVS unique to this office visit / same day charting = 32 min summary final f/u ov

## 2024-04-09 ENCOUNTER — Other Ambulatory Visit: Payer: Self-pay | Admitting: Internal Medicine

## 2024-04-09 DIAGNOSIS — J479 Bronchiectasis, uncomplicated: Secondary | ICD-10-CM

## 2024-04-14 ENCOUNTER — Telehealth: Payer: Self-pay | Admitting: Internal Medicine

## 2024-04-14 NOTE — Telephone Encounter (Signed)
 LVM for patient fto call and discuss scheduling the chest ct at the end of October and follow up with Dr. Darlean in November

## 2024-04-21 NOTE — Telephone Encounter (Signed)
 LVM for patient to call and discuss scheduling the CT chest due in October and follow up with Dr. Darlean in November 2025

## 2024-04-26 NOTE — Telephone Encounter (Signed)
 LVM for patient to call and discuss scheduling the chest ct and follow up appointment with Dr Darlean due at the end of October 2025

## 2024-05-02 ENCOUNTER — Encounter: Payer: Self-pay | Admitting: Internal Medicine

## 2024-05-03 ENCOUNTER — Encounter: Payer: Self-pay | Admitting: Internal Medicine

## 2024-05-03 NOTE — Telephone Encounter (Signed)
 LVM for patient to call and discuss scheduling the CT chest due in October---will mail letter requesting she contact the office

## 2024-05-16 ENCOUNTER — Telehealth: Payer: Self-pay | Admitting: Internal Medicine

## 2024-05-16 NOTE — Telephone Encounter (Signed)
 Copied from CRM [id number]. Topic: General - Other >> May 16, 2024  4:07 PM Rilla B wrote: Reason for CRM: returning a call to [place] to schedule her CT scan.  Called [facility], no answer. Patient states she's in the process of moving to another area.  Will complete this there when she's settled in. Please call patient with any question @ [phone].

## 2024-05-17 NOTE — Telephone Encounter (Signed)
 Patient is in the process of moving to another area and will have this done when she is settled
